# Patient Record
Sex: Female | Born: 1945 | Race: White | Hispanic: No | Marital: Married | Smoking: Former smoker
Health system: Southern US, Community
[De-identification: ages and names within clinical notes are randomized; demographics above are authoritative.]

## PROBLEM LIST (undated history)

## (undated) DIAGNOSIS — K219 Gastro-esophageal reflux disease without esophagitis: Secondary | ICD-10-CM

## (undated) DIAGNOSIS — G4730 Sleep apnea, unspecified: Secondary | ICD-10-CM

## (undated) DIAGNOSIS — E669 Obesity, unspecified: Secondary | ICD-10-CM

## (undated) DIAGNOSIS — R0600 Dyspnea, unspecified: Secondary | ICD-10-CM

## (undated) DIAGNOSIS — I10 Essential (primary) hypertension: Secondary | ICD-10-CM

## (undated) DIAGNOSIS — D329 Benign neoplasm of meninges, unspecified: Secondary | ICD-10-CM

## (undated) DIAGNOSIS — F40240 Claustrophobia: Secondary | ICD-10-CM

## (undated) DIAGNOSIS — J189 Pneumonia, unspecified organism: Secondary | ICD-10-CM

## (undated) DIAGNOSIS — R011 Cardiac murmur, unspecified: Secondary | ICD-10-CM

## (undated) DIAGNOSIS — E785 Hyperlipidemia, unspecified: Secondary | ICD-10-CM

## (undated) DIAGNOSIS — J449 Chronic obstructive pulmonary disease, unspecified: Secondary | ICD-10-CM

## (undated) DIAGNOSIS — I509 Heart failure, unspecified: Secondary | ICD-10-CM

## (undated) DIAGNOSIS — M1990 Unspecified osteoarthritis, unspecified site: Secondary | ICD-10-CM

## (undated) DIAGNOSIS — F329 Major depressive disorder, single episode, unspecified: Secondary | ICD-10-CM

## (undated) DIAGNOSIS — I5181 Takotsubo syndrome: Secondary | ICD-10-CM

## (undated) DIAGNOSIS — F32A Depression, unspecified: Secondary | ICD-10-CM

## (undated) DIAGNOSIS — Z87442 Personal history of urinary calculi: Secondary | ICD-10-CM

## (undated) HISTORY — DX: Hyperlipidemia, unspecified: E78.5

## (undated) HISTORY — PX: ABDOMINAL HYSTERECTOMY: SHX81

## (undated) HISTORY — DX: Heart failure, unspecified: I50.9

## (undated) HISTORY — PX: CARDIAC CATHETERIZATION: SHX172

## (undated) HISTORY — PX: KIDNEY STONE SURGERY: SHX686

## (undated) HISTORY — DX: Benign neoplasm of meninges, unspecified: D32.9

---

## 1998-08-07 ENCOUNTER — Ambulatory Visit (HOSPITAL_COMMUNITY): Admission: RE | Admit: 1998-08-07 | Discharge: 1998-08-07 | Payer: Self-pay | Admitting: Family Medicine

## 1998-12-22 ENCOUNTER — Emergency Department (HOSPITAL_COMMUNITY): Admission: EM | Admit: 1998-12-22 | Discharge: 1998-12-22 | Payer: Self-pay | Admitting: Emergency Medicine

## 1998-12-22 ENCOUNTER — Encounter: Payer: Self-pay | Admitting: Emergency Medicine

## 1998-12-23 ENCOUNTER — Encounter: Payer: Self-pay | Admitting: Emergency Medicine

## 1998-12-30 ENCOUNTER — Encounter: Payer: Self-pay | Admitting: Cardiology

## 1998-12-30 ENCOUNTER — Ambulatory Visit (HOSPITAL_COMMUNITY): Admission: RE | Admit: 1998-12-30 | Discharge: 1998-12-30 | Payer: Self-pay | Admitting: Cardiology

## 1999-09-06 ENCOUNTER — Other Ambulatory Visit: Admission: RE | Admit: 1999-09-06 | Discharge: 1999-09-06 | Payer: Self-pay | Admitting: Family Medicine

## 1999-09-14 ENCOUNTER — Encounter: Payer: Self-pay | Admitting: Family Medicine

## 1999-09-14 ENCOUNTER — Encounter: Admission: RE | Admit: 1999-09-14 | Discharge: 1999-09-14 | Payer: Self-pay | Admitting: Family Medicine

## 2000-09-21 ENCOUNTER — Encounter: Admission: RE | Admit: 2000-09-21 | Discharge: 2000-09-21 | Payer: Self-pay | Admitting: Family Medicine

## 2000-09-21 ENCOUNTER — Encounter: Payer: Self-pay | Admitting: Family Medicine

## 2001-02-05 ENCOUNTER — Other Ambulatory Visit: Admission: RE | Admit: 2001-02-05 | Discharge: 2001-02-05 | Payer: Self-pay | Admitting: Family Medicine

## 2001-07-16 ENCOUNTER — Encounter: Payer: Self-pay | Admitting: Family Medicine

## 2001-07-16 ENCOUNTER — Encounter: Admission: RE | Admit: 2001-07-16 | Discharge: 2001-07-16 | Payer: Self-pay | Admitting: Family Medicine

## 2001-12-13 ENCOUNTER — Other Ambulatory Visit: Admission: RE | Admit: 2001-12-13 | Discharge: 2001-12-13 | Payer: Self-pay | Admitting: Family Medicine

## 2001-12-19 ENCOUNTER — Encounter: Admission: RE | Admit: 2001-12-19 | Discharge: 2001-12-19 | Payer: Self-pay | Admitting: Family Medicine

## 2001-12-19 ENCOUNTER — Encounter: Payer: Self-pay | Admitting: Family Medicine

## 2001-12-25 ENCOUNTER — Ambulatory Visit (HOSPITAL_COMMUNITY): Admission: RE | Admit: 2001-12-25 | Discharge: 2001-12-25 | Payer: Self-pay | Admitting: Cardiology

## 2001-12-25 ENCOUNTER — Encounter: Payer: Self-pay | Admitting: Cardiology

## 2002-06-21 ENCOUNTER — Encounter: Admission: RE | Admit: 2002-06-21 | Discharge: 2002-06-21 | Payer: Self-pay

## 2002-12-09 ENCOUNTER — Encounter: Payer: Self-pay | Admitting: Urology

## 2002-12-16 ENCOUNTER — Observation Stay (HOSPITAL_COMMUNITY): Admission: RE | Admit: 2002-12-16 | Discharge: 2002-12-17 | Payer: Self-pay | Admitting: Urology

## 2003-05-22 ENCOUNTER — Ambulatory Visit (HOSPITAL_BASED_OUTPATIENT_CLINIC_OR_DEPARTMENT_OTHER): Admission: RE | Admit: 2003-05-22 | Discharge: 2003-05-22 | Payer: Self-pay | Admitting: Urology

## 2003-09-16 ENCOUNTER — Encounter: Admission: RE | Admit: 2003-09-16 | Discharge: 2003-09-16 | Payer: Self-pay | Admitting: Family Medicine

## 2003-11-06 ENCOUNTER — Encounter: Admission: RE | Admit: 2003-11-06 | Discharge: 2003-11-06 | Payer: Self-pay

## 2005-09-22 ENCOUNTER — Observation Stay (HOSPITAL_COMMUNITY): Admission: EM | Admit: 2005-09-22 | Discharge: 2005-09-23 | Payer: Self-pay | Admitting: Emergency Medicine

## 2005-09-22 ENCOUNTER — Ambulatory Visit: Payer: Self-pay | Admitting: Family Medicine

## 2006-11-02 ENCOUNTER — Encounter: Admission: RE | Admit: 2006-11-02 | Discharge: 2006-11-02 | Payer: Self-pay | Admitting: Family Medicine

## 2008-04-16 ENCOUNTER — Encounter: Admission: RE | Admit: 2008-04-16 | Discharge: 2008-04-16 | Payer: Self-pay | Admitting: Family Medicine

## 2008-11-24 HISTORY — PX: CHOLECYSTECTOMY: SHX55

## 2008-12-01 ENCOUNTER — Encounter (INDEPENDENT_AMBULATORY_CARE_PROVIDER_SITE_OTHER): Payer: Self-pay | Admitting: *Deleted

## 2008-12-01 ENCOUNTER — Ambulatory Visit (HOSPITAL_COMMUNITY): Admission: RE | Admit: 2008-12-01 | Discharge: 2008-12-02 | Payer: Self-pay | Admitting: *Deleted

## 2010-10-12 ENCOUNTER — Encounter
Admission: RE | Admit: 2010-10-12 | Discharge: 2010-10-12 | Payer: Self-pay | Source: Home / Self Care | Attending: Family Medicine | Admitting: Family Medicine

## 2010-10-28 ENCOUNTER — Encounter
Admission: RE | Admit: 2010-10-28 | Discharge: 2010-10-28 | Payer: Self-pay | Source: Home / Self Care | Attending: Family Medicine | Admitting: Family Medicine

## 2010-11-13 ENCOUNTER — Encounter: Payer: Self-pay | Admitting: Family Medicine

## 2011-02-08 LAB — COMPREHENSIVE METABOLIC PANEL
ALT: 17 U/L (ref 0–35)
AST: 22 U/L (ref 0–37)
Albumin: 3.6 g/dL (ref 3.5–5.2)
Alkaline Phosphatase: 56 U/L (ref 39–117)
BUN: 13 mg/dL (ref 6–23)
CO2: 29 mEq/L (ref 19–32)
Calcium: 9 mg/dL (ref 8.4–10.5)
Chloride: 108 mEq/L (ref 96–112)
Creatinine, Ser: 0.83 mg/dL (ref 0.4–1.2)
GFR calc Af Amer: >60 mL/min (ref >60)
GFR calc non Af Amer: >60 mL/min (ref >60)
Glucose, Bld: 100 mg/dL — ABNORMAL HIGH (ref 70–99)
Potassium: 4 mEq/L (ref 3.5–5.1)
Sodium: 143 mEq/L (ref 135–145)
Total Bilirubin: 1.1 mg/dL (ref 0.3–1.2)
Total Protein: 6.5 g/dL (ref 6.0–8.3)

## 2011-02-08 LAB — DIFFERENTIAL
Basophils Absolute: 0.1 10*3/uL (ref 0.0–0.1)
Basophils Relative: 1 % (ref 0–1)
Eosinophils Absolute: 0.1 10*3/uL (ref 0.0–0.7)
Eosinophils Relative: 2 % (ref 0–5)
Lymphocytes Relative: 32 % (ref 12–46)
Lymphs Abs: 2.5 10*3/uL (ref 0.7–4.0)
Monocytes Absolute: 0.5 10*3/uL (ref 0.1–1.0)
Monocytes Relative: 6 % (ref 3–12)
Neutro Abs: 4.6 10*3/uL (ref 1.7–7.7)
Neutrophils Relative %: 60 % (ref 43–77)

## 2011-02-08 LAB — CBC
HCT: 44.8 % (ref 36.0–46.0)
Hemoglobin: 15.4 g/dL — ABNORMAL HIGH (ref 12.0–15.0)
MCHC: 34.3 g/dL (ref 30.0–36.0)
MCV: 100.3 fL — ABNORMAL HIGH (ref 78.0–100.0)
Platelets: 225 10*3/uL (ref 150–400)
RBC: 4.46 MIL/uL (ref 3.87–5.11)
RDW: 12.4 % (ref 11.5–15.5)
WBC: 7.7 10*3/uL (ref 4.0–10.5)

## 2011-02-14 ENCOUNTER — Other Ambulatory Visit: Payer: Self-pay | Admitting: Orthopedic Surgery

## 2011-02-14 ENCOUNTER — Encounter (HOSPITAL_COMMUNITY): Payer: Medicare Other

## 2011-02-14 ENCOUNTER — Other Ambulatory Visit (HOSPITAL_COMMUNITY): Payer: Self-pay | Admitting: Orthopedic Surgery

## 2011-02-14 ENCOUNTER — Ambulatory Visit (HOSPITAL_COMMUNITY)
Admission: RE | Admit: 2011-02-14 | Discharge: 2011-02-14 | Disposition: A | Discharge status: Home / Self Care | Payer: Medicare Other | Source: Ambulatory Visit | Attending: Orthopedic Surgery | Admitting: Orthopedic Surgery

## 2011-02-14 DIAGNOSIS — Z01812 Encounter for preprocedural laboratory examination: Secondary | ICD-10-CM | POA: Insufficient documentation

## 2011-02-14 DIAGNOSIS — Z01818 Encounter for other preprocedural examination: Secondary | ICD-10-CM

## 2011-02-14 DIAGNOSIS — Z01811 Encounter for preprocedural respiratory examination: Secondary | ICD-10-CM | POA: Insufficient documentation

## 2011-02-14 DIAGNOSIS — I517 Cardiomegaly: Secondary | ICD-10-CM | POA: Insufficient documentation

## 2011-02-14 LAB — BASIC METABOLIC PANEL
BUN: 11 mg/dL (ref 6–23)
CO2: 32 mEq/L (ref 19–32)
Calcium: 9.5 mg/dL (ref 8.4–10.5)
Chloride: 103 mEq/L (ref 96–112)
Creatinine, Ser: 0.91 mg/dL (ref 0.4–1.2)
GFR calc Af Amer: >60 mL/min (ref >60)
GFR calc non Af Amer: >60 mL/min (ref >60)
Glucose, Bld: 74 mg/dL (ref 70–99)
Potassium: 4.2 mEq/L (ref 3.5–5.1)
Sodium: 142 mEq/L (ref 135–145)

## 2011-02-14 LAB — DIFFERENTIAL
Basophils Absolute: 0.1 10*3/uL (ref 0.0–0.1)
Basophils Relative: 1 % (ref 0–1)
Eosinophils Absolute: 0.2 10*3/uL (ref 0.0–0.7)
Eosinophils Relative: 2 % (ref 0–5)
Lymphocytes Relative: 34 % (ref 12–46)
Lymphs Abs: 3.2 10*3/uL (ref 0.7–4.0)
Monocytes Absolute: 0.7 10*3/uL (ref 0.1–1.0)
Monocytes Relative: 7 % (ref 3–12)
Neutro Abs: 5.1 10*3/uL (ref 1.7–7.7)
Neutrophils Relative %: 56 % (ref 43–77)

## 2011-02-14 LAB — CBC
HCT: 47.4 % — ABNORMAL HIGH (ref 36.0–46.0)
Hemoglobin: 16.6 g/dL — ABNORMAL HIGH (ref 12.0–15.0)
MCH: 33.9 pg (ref 26.0–34.0)
MCHC: 35 g/dL (ref 30.0–36.0)
MCV: 96.7 fL (ref 78.0–100.0)
Platelets: 266 10*3/uL (ref 150–400)
RBC: 4.9 MIL/uL (ref 3.87–5.11)
RDW: 12 % (ref 11.5–15.5)
WBC: 9.2 10*3/uL (ref 4.0–10.5)

## 2011-02-14 LAB — PROTIME-INR
INR: 1.02 (ref 0.00–1.49)
Prothrombin Time: 13.6 seconds (ref 11.6–15.2)

## 2011-02-14 LAB — SURGICAL PCR SCREEN
MRSA, PCR: NEGATIVE
Staphylococcus aureus: NEGATIVE

## 2011-02-14 LAB — APTT: aPTT: 30 seconds (ref 24–37)

## 2011-02-15 LAB — URINALYSIS, ROUTINE W REFLEX MICROSCOPIC
Bilirubin Urine: NEGATIVE
Glucose, UA: NEGATIVE mg/dL
Hgb urine dipstick: NEGATIVE
Ketones, ur: NEGATIVE mg/dL
Nitrite: NEGATIVE
Protein, ur: NEGATIVE mg/dL
Specific Gravity, Urine: 1.015 (ref 1.005–1.030)
Urobilinogen, UA: 0.2 mg/dL (ref 0.0–1.0)
pH: 5.5 (ref 5.0–8.0)

## 2011-02-15 LAB — URINE MICROSCOPIC-ADD ON

## 2011-02-22 ENCOUNTER — Inpatient Hospital Stay (HOSPITAL_COMMUNITY)
Admission: RE | Admit: 2011-02-22 | Discharge: 2011-02-25 | DRG: 470 | Disposition: A | Discharge status: Skilled Nursing Facility | Payer: Medicare Other | Source: Ambulatory Visit | Attending: Orthopedic Surgery | Admitting: Orthopedic Surgery

## 2011-02-22 DIAGNOSIS — M1710 Unilateral primary osteoarthritis, unspecified knee: Principal | ICD-10-CM | POA: Diagnosis present

## 2011-02-22 DIAGNOSIS — Z01812 Encounter for preprocedural laboratory examination: Secondary | ICD-10-CM

## 2011-02-22 DIAGNOSIS — N390 Urinary tract infection, site not specified: Secondary | ICD-10-CM | POA: Diagnosis not present

## 2011-02-22 DIAGNOSIS — G4733 Obstructive sleep apnea (adult) (pediatric): Secondary | ICD-10-CM | POA: Diagnosis present

## 2011-02-22 DIAGNOSIS — F341 Dysthymic disorder: Secondary | ICD-10-CM | POA: Diagnosis present

## 2011-02-22 DIAGNOSIS — I10 Essential (primary) hypertension: Secondary | ICD-10-CM | POA: Diagnosis present

## 2011-02-22 HISTORY — PX: TOTAL KNEE ARTHROPLASTY: SHX125

## 2011-02-22 LAB — TYPE AND SCREEN
ABO/RH(D): O POS
Antibody Screen: NEGATIVE

## 2011-02-22 LAB — ABO/RH: ABO/RH(D): O POS

## 2011-02-23 LAB — BASIC METABOLIC PANEL
BUN: 11 mg/dL (ref 6–23)
CO2: 28 mEq/L (ref 19–32)
Calcium: 8.6 mg/dL (ref 8.4–10.5)
Chloride: 105 mEq/L (ref 96–112)
Creatinine, Ser: 0.8 mg/dL (ref 0.4–1.2)
GFR calc Af Amer: >60 mL/min (ref >60)
GFR calc non Af Amer: >60 mL/min (ref >60)
Glucose, Bld: 126 mg/dL — ABNORMAL HIGH (ref 70–99)
Potassium: 4.8 mEq/L (ref 3.5–5.1)
Sodium: 140 mEq/L (ref 135–145)

## 2011-02-23 LAB — CBC
HCT: 41.1 % (ref 36.0–46.0)
Hemoglobin: 13.8 g/dL (ref 12.0–15.0)
MCH: 33.1 pg (ref 26.0–34.0)
MCHC: 33.6 g/dL (ref 30.0–36.0)
MCV: 98.6 fL (ref 78.0–100.0)
Platelets: 221 10*3/uL (ref 150–400)
RBC: 4.17 MIL/uL (ref 3.87–5.11)
RDW: 11.9 % (ref 11.5–15.5)
WBC: 13.4 10*3/uL — ABNORMAL HIGH (ref 4.0–10.5)

## 2011-02-24 LAB — BASIC METABOLIC PANEL
BUN: 12 mg/dL (ref 6–23)
CO2: 28 mEq/L (ref 19–32)
Calcium: 8.5 mg/dL (ref 8.4–10.5)
Chloride: 101 mEq/L (ref 96–112)
Creatinine, Ser: 0.88 mg/dL (ref 0.4–1.2)
GFR calc Af Amer: >60 mL/min (ref >60)
GFR calc non Af Amer: >60 mL/min (ref >60)
Glucose, Bld: 136 mg/dL — ABNORMAL HIGH (ref 70–99)
Potassium: 4.5 mEq/L (ref 3.5–5.1)
Sodium: 137 mEq/L (ref 135–145)

## 2011-02-24 LAB — CBC
HCT: 39.7 % (ref 36.0–46.0)
Hemoglobin: 13.4 g/dL (ref 12.0–15.0)
MCH: 33.9 pg (ref 26.0–34.0)
MCHC: 33.8 g/dL (ref 30.0–36.0)
MCV: 100.5 fL — ABNORMAL HIGH (ref 78.0–100.0)
Platelets: 213 10*3/uL (ref 150–400)
RBC: 3.95 MIL/uL (ref 3.87–5.11)
RDW: 12.1 % (ref 11.5–15.5)
WBC: 13.8 10*3/uL — ABNORMAL HIGH (ref 4.0–10.5)

## 2011-02-25 LAB — URINE MICROSCOPIC-ADD ON

## 2011-02-25 LAB — URINALYSIS, ROUTINE W REFLEX MICROSCOPIC
Bilirubin Urine: NEGATIVE
Glucose, UA: NEGATIVE mg/dL
Ketones, ur: NEGATIVE mg/dL
Leukocytes, UA: NEGATIVE
Nitrite: NEGATIVE
Protein, ur: NEGATIVE mg/dL
Specific Gravity, Urine: 1.022 (ref 1.005–1.030)
Urobilinogen, UA: 0.2 mg/dL (ref 0.0–1.0)
pH: 5.5 (ref 5.0–8.0)

## 2011-02-25 NOTE — Discharge Summary (Signed)
[name], [name]            ACCOUNT NO.:  [id number]  MEDICAL RECORD NO.:  [id number]           PATIENT TYPE:  I  LOCATION:  1604                         FACILITY:  [facility]  PHYSICIAN:  Madlyn Frankel. Charlann Boxer, M.D.  DATE OF BIRTH:  1946-09-11  DATE OF ADMISSION:  02/22/2011 DATE OF DISCHARGE:  02/25/2011                        DISCHARGE SUMMARY - REFERRING   ADMITTING DIAGNOSIS:  Advanced left knee osteoarthritis.  DISCHARGE DIAGNOSES: 1. Advanced left knee osteoarthritis status post left total knee     replacement on Feb 22, 2011. 2. Anxiety. 3. Depression. 4. Hypertension.  ADMITTING HISTORY:  Alexis Edwards is a 65 year old female who presented to the office for evaluation of her left knee.  She had a history of left knee arthroscopy with persistent and progressive discomfort.  She had failed to respond to further conservative measures and was ready to proceed with more definitive measures.  Risks and benefits of kneereplacement and surgery were discussed and reviewed in the office.  She was noted to be a candidate for tranexamic acid.  Postoperatively, she wished to proceed to nursing facility for a rehab.  HOSPITAL COURSE:  The patient was admitted for same-day surgery on Feb 22, 2011.  She underwent an uncomplicated left total knee replacement. Please see dictated operative note for full details of the procedure.  Postoperatively after routine stay in the recovery room, she was transferred to orthopedic ward where she remained for her hospital stay. Her postoperative course was uncomplicated.  Postoperative day #1, she was noted to have a hematocrit of 41.1 on day #2 at 39.7.  Her electrolytes remained stable throughout her hospital stay.  No blood was required.  She had her Foley catheter and Hemovac drains removed on postoperative day #1 and was seen and evaluated by therapy.  Her therapy was a little bit slow based on decreased mobility related to  pain.  Perioperatively, she will be sent to a nursing facility for rehab purposes.  Social Work was consulted for assistance in managing this. By postoperative day #3, she was ready to be transferred to nursing facility.  DISCHARGE INSTRUCTIONS:  The patient will be discharged to [name].  She will be seen and evaluated by Physical Therapy to work on range of motion, strength, and gait training of her left lower extremity.  She has currently an Aquacel dressing in place which can get wet and will remain in place until May 8th or 9th at which point it can be removed and gauze and tape applied.  She will return to see Dr. Durene Romans at [facility], phone number [phone] in 2 weeks' time if appointment has not already been scheduled.  If she has any orthopedic questions, should contact our office.  Please note that postoperative day #3 in the morning she had spiked a fever to 101.6.  Her wound was clean, dry, and intact.  I will order a urinalysis at the time of discharge.  I also placed her on ciprofloxacin to cover on basis while she is in the facility.  This medication can be discontinued if UA at the time of discharge is normal.  DISCHARGE MEDICATIONS: 1. Colace 100 mg p.o. b.i.d. as needed for constipation while on pain     medicine. 2. Aspirin 325 mg p.o. b.i.d. for 30 days. 3. Norco 7.5/325 one to two tablets every 4 to 6 hours as needed for     pain. 4. Robaxin 500 mg p.o. q.6 h. as needed for muscle spasm. 5. MiraLax 17 g p.o. daily as needed for constipation while on pain     medicine. 6. Senokot-S q.h.s. as needed for further constipation while on pain     medicine. 7. Calcium 600 mg b.i.d. 8. Citalopram 40 mg half tablet b.i.d. 9. Glucosamine b.i.d. 10.Hydrochlorothiazide 25 mg q.a.m. 11.Multivitamins p.o. daily. 12.Naproxen 500 mg daily as needed. 13.Vitamin C over-the-counter b.i.d. 14.Vitamin D3 over-the-counter as  needed.  In addition, as noted I did start ciprofloxacin at the time of discharge for complaints consistent with urinary tract infection.     Madlyn Frankel Charlann Boxer, M.D.     MDO/MEDQ  D:  02/25/2011  T:  02/25/2011  Job:  [id number]  Electronically Signed by Durene Romans M.D. on 02/25/2011 02:27:26 PM

## 2011-02-25 NOTE — Op Note (Signed)
Alexis Edwards, Alexis Edwards            ACCOUNT NO.:  [id number]  MEDICAL RECORD NO.:  [id number]           PATIENT TYPE:  I  LOCATION:  0003                         FACILITY:  [facility]  PHYSICIAN:  Madlyn Frankel. Charlann Boxer, M.D.  DATE OF BIRTH:  Jul 12, 1946  DATE OF PROCEDURE:  02/22/2011 DATE OF DISCHARGE:                              OPERATIVE REPORT   PREOPERATIVE DIAGNOSES: 1. Left knee osteoarthritis. 2. Obesity. 3. Right knee osteoarthritis.  POSTOPERATIVE DIAGNOSES: 1. Left knee osteoarthritis. 2. Obesity. 3. Right knee osteoarthritis.  PROCEDURE: 1. Left total-knee replacement. 2. Right knee injection intra-articularly with 80 mg of Depo-Medrol     and 6 cc of Marcaine.  COMPONENTS USED:  DePuy rotating platform posterior stabilized knee system, size 3 femur, 2.5 tibia, 10-mm insert and a 38 patellar button.  SURGEON:  Madlyn Frankel. Charlann Boxer, M.D.  ASSISTANT:  Jaquelyn Bitter. Chabon, P.A.C.  ANESTHESIA:  Spinal.  SPECIMENS:  None.  COMPLICATIONS:  None.  ESTIMATED BLOOD LOSS:  About 100 cc.  DRAINS:  One Hemovac.  TOURNIQUET TIME:  45 minutes at 250 mmHg.  INDICATIONS FOR THE PROCEDURE:  Ms. Strauch is a 65 year old female who had presented to the office for evaluation of bilateral knee pain. Radiographs revealed end-stage degenerative changes bilaterally.  She elected at this point to proceed with a left total-knee replacement. The risks of infection, DVT, and component failure were all discussed and reviewed in the setting of her postoperative course and consent was obtained for the above.  PROCEDURE IN DETAIL:  The patient was brought to the operative theater. Once adequate anesthesia, preoperative antibiotics, Ancef were administered, the patient was positioned supine with a left thigh tourniquet placed.  The left lower extremity was then prepped and draped in the sterile fashion with the left leg in a leg holder.  A time-out was performed identifying the patient, planned  procedure and extremity.  The leg was exsanguinated, tourniquet elevated to 250 mmHg.  Midline incision was made followed by a median arthrotomy following initial exposure and debridement.  Attention was first directed to the patella. Precut measurement was approximately 21 to 22 mm.  I resected down to about 13 to 14 mm and used a 38 patellar button to restore patellar height.  Lug holes were drilled and a metal shim placed.  Attention was now directed to the femur.  The femoral canal was opened and the drill irrigated to try to prevent fat emboli.  An intramedullary rod was passed and at 3 degrees of valgus, 10 mm of bone was resected off the distal femur.  Following this resection, the tibia was subluxated anteriorly and using the extramedullary guide parallel to the shaft of the tibia, I resected a measured cut of 10 mm off the proximal lateral tibia.  Following this resection, we confirmed that the gap was stable medially and laterally with the 10-mm insert as well as confirmed that the cut was perpendicular in the coronal plane.  At this point, I sized the femur to be a size 3.  The size 3 rotation block was pinned into position using the proximal tibia with a C-clamp for rotation, anterior reference.  The 4-in-1 cutting block was then positioned and pinned into place.  Anterior, posterior and chamfer cuts were then made without difficulty nor notching.  The final box cut was made off the lateral aspect of the distal femur.  At this point, the tibia was subluxated anteriorly and the cut surface seemed to be best fit with a size 2.5 tibial tray and it was pinned into position, drilled and keel punched.  Trial reduction was now carried out with the 3 femur, 2.5 tibia, and a 10-mm insert.  With this, the knee came to full extension and the patella tracked through the trochlea without the application of pressure.  Given these findings, the final components were opened.  The knee  was irrigated with normal saline solution pulse lavage and then the synovial capsule junction was then injected with 0.25% Marcaine with epinephrine and 1 cc of Toradol.  At this point, the final components were cemented onto cleaned and dried cut surfaces of bone.  As we were cementing, the femoral component fell off of the impaction device onto the floor.  As we were getting a new femoral component ready, a new batch of cement was mixed.  I did end up putting the trial femur in place and held the knee in extension and removed extruded cement around the tibial component and cemented in the patella.  Once we had the new component in place which was a lugged femur, the lug holes were drilled in the femur and the final component was cemented into place.  The tourniquet was let down after 45 minutes at 250 mmHg with no significant hemostasis required.  The final 10-mm insert was placed and extruded cement was removed throughout the femoral and tibial surfaces.  Once the cement had fully cured, the medium Hemovac drain was placed deep.  I then irrigated the knee out with normal saline solution again. We then reapproximated the extensor mechanism using #1 Vicryl with the knee in flexion.  The remainder of the wound was closed with 2-0 Vicryl and running 4-0 Monocryl.  The knee was cleaned, dried and dressed sterilely using Dermabond and Aquacel dressing.  Following this procedure, a second time-out was performed identifying the procedure for the right knee.  Under Betadine prep, the right knee was injected with 80 mg of Depo-Medrol and 6 cc of Marcaine.  Once this was done, she was then brought to the recovery room in stable condition, tolerating the procedures well.     Madlyn Frankel Charlann Boxer, M.D.     MDO/MEDQ  D:  02/22/2011  T:  02/22/2011  Job:  [id number]  Electronically Signed by Durene Romans M.D. on 02/25/2011 08:15:14 AM

## 2011-03-08 NOTE — Op Note (Signed)
NAME:  Alexis Edwards, Alexis Edwards            ACCOUNT NO.:  [id number]   MEDICAL RECORD NO.:  [id number]          PATIENT TYPE:  AMB   LOCATION:  DAY                          FACILITY:  [facility]   PHYSICIAN:  Alfonse Ras, MD   DATE OF BIRTH:  1946-09-04   DATE OF PROCEDURE:  12/01/2008  DATE OF DISCHARGE:                               OPERATIVE REPORT   PREOPERATIVE DIAGNOSIS:  Symptomatic cholelithiasis.   POSTOPERATIVE DIAGNOSIS:  Symptomatic cholelithiasis.   PROCEDURE:  Laparoscopic cholecystectomy.   ASSISTANT:  Ardeth Sportsman, MD   ANESTHESIA:  General.   DESCRIPTION:  After extensive informed consent was granted in this  morbidly obese patient with known cholelithiasis which was quite  symptomatic.  She was taken back to the operating room and underwent  general anesthesia.  Using a supraumbilical vertical incision, I  dissected down to the fascia which was very very deep.  This was opened  vertically.  An 0-Vicryl pursestring suture was placed around the  fascial defect.  Hassan trocar was placed through the defect.  Pneumoperitoneum was obtained; and under direct vision, an 11 mm trocar  was placed in subxiphoid region and two 5 mm trocars were placed in the  right abdomen.  Gallbladder was identified and retracted cephalad.  Starting at the neck of the gallbladder, a critical view was obtained of  the cystic duct which was very small in diameter, but was well  mobilized, triply clipped and divided.  Cystic artery critical view was  obtained, triply, clipped, and divided.  Gallbladder was taken off the  gallbladder bed using Bovie electrocautery and the gallbladder was  placed in EndoCatch bag.  It was removed through the umbilical port.  There was some moderate bleeding from the skin incision at the  subxiphoid region which was controlled with electrocautery.  The right  upper quadrant was copiously irrigated.  Pneumoperitoneum was released.  Supraumbilical fascial defect was  closed with the 0-Vicryl pursestring  suture.  Skin incisions were closed with subcuticular 4-0 Monocryl.  Steri-Strips and dressings were applied.  The patient tolerated the  procedure well, and went to PACU in good condition.      Alfonse Ras, MD  Electronically Signed     KRE/MEDQ  D:  12/01/2008  T:  12/02/2008  Job:  [id number]

## 2011-03-11 NOTE — Discharge Summary (Signed)
NAMEMarland Kitchen  MALAIJAH, HOUCHEN            ACCOUNT NO.:  [id number]   MEDICAL RECORD NO.:  [id number]          PATIENT TYPE:  INP   LOCATION:  3707                         FACILITY:  [facility]   PHYSICIAN:  Santiago Bumpers. Hensel, M.D.DATE OF BIRTH:  1946-01-11   DATE OF ADMISSION:  09/22/2005  DATE OF DISCHARGE:  09/23/2005                                 DISCHARGE SUMMARY   DISCHARGE DIAGNOSES:  1.  Atypical chest pain.  2.  Obesity.  3.  History of gallbladder dysfunction.  4.  History of arthritis.  5.  History of migraines.   DISCHARGE MEDICATIONS:  1.  Celexa 20 mg daily.  2.  Hydrochlorothiazide 12.5 mg p.o. every day.  3.  Percocet 500/5 mg, one to two tabs p.o. q.4h. p.r.n. for pain.  4.  Prilosec 40 mg p.o. every day.  5.  Continue other supplements like calcium, glucosamine, chondroitin, and      multivitamin as per prior home regimen.   DISCHARGE INSTRUCTIONS:  The patient is instructed to follow up with Dr.  Val Riles at [facility] in 1-2 weeks after discharge.  The  patient agreed to call this clinic to set up an appointment.   HOSPITAL COURSE:  This is a 65 year old female who was admitted, on September 22, 2005, with complaints of shortness of breath with exertion, substernal  chest pain oppressive in character with radiation to the arms and neck  bilaterally at rest.  She also reported nausea and was diaphoretic.  The  patient was treated at her primary care physician's office with sublingual  nitroglycerin and aspirin, and she presented improvement of her pain and was  referred to [facility], and she was admitted  with a possible diagnosis of unstable angina and acute coronary syndrome.   ADMISSION PHYSICAL EXAMINATION:  VITAL SIGNS:  Admission, the patient's  vitals were temperature 98.2, pulse 68, respirations 20, blood pressure  127/60.  She was sating at 97% on 2 liters nasal cannula.  Her physical exam  was basically  normal.  CARDIOVASCULAR:  Showed a regular rate and rhythm with no murmurs, rubs, or  gallops.  No JVD.  No bruits.  Pedal pulses 2/2 bilaterally and not pain  reproducible with chest wall palpation.  LUNGS:  Clear to auscultation bilaterally.  ABDOMEN:  Soft, nontender, nondistended with positive active bowel sounds.  EXTREMITIES:  Showed no edema.  No cyanosis.  No clubbing.   PERTINENT LABORATORY:  The patient had cardiac enzyme markers that were  normal x3.  Her CMP on admission showed a sodium of 143.  Her potassium was  3.3, chloride 106, CO2 27, glucose 48, and calcium 9.2.  Her BUN was 12,  creatinine 0.8.  Total bilirubin 0.8.  Liver function tests were all within  normal limits.  The patient's TSH was 2.908.  B natriuretic peptide was less  than 30.  Serum amylase was 60 and vitamin B-12 was 753.  The patient had a  fasting lipid profile that showed a total cholesterol of 181 with  triglycerides 120, HDL 39, and LDL 118.  On discharge day, the  patient's B-  MET was within normal limits with a sodium of 139, potassium 3.7, chloride  107, CO2 25, glucose 131, BUN 11, creatinine 0.9, calcium 8.5.  The  patient's admission EKG and repeat EKG 24 hours after admission showed sinus  rhythm with no ischemic changes.  Chest x-ray, on September 22, 2005, showed  cardiomegaly with mild interstitial edema.  Abdominal ultrasound, on  September 23, 2005, showed probably tiny gallbladder polyp, no stones, no  biliary tract dilatation, a 1.4-cm left intrahepatic lobe complex cyst  versus solid mass with the recommendation of MRI or CT for further  evaluation.   PROCEDURES:  The patient underwent cardiac catheterization, on September 23, 2005, that showed normal coronary arteries, normal left ventricle, normal  CVEDP, with an ejection fraction of 55%.   CONSULTATIONS:  Georga Hacking, M.D., cardiologist was consulted.   PROBLEM LIST:  1.  Chest pain.  The pain was initially treated with  morphine, later      transitioned to Percocet.  The patient's chest pain during the      hospitalization did not escalate and it was almost resolved on discharge      day.  The patient still had some mild discomfort but it was improving.      She underwent cardiac catheterization that showed normal coronary      arteries.  She has cardiac enzymes negative per three times.  She did      not have ischemic changes in her EKG.  Possible etiology will be      gallbladder dysfunction or acid reflux.  The patient's abdominal      ultrasound was negative for gallbladder stones or biliary tract      dilatation.  I will discharge the patient home to continue GI workup as      an outpatient.  The patient will be discharged on Prilosec 40 mg daily      and she also will be given 20 p.o. of Percocet to take q.4-6h. p.r.n. if      recurrence of her pain.  The patient's nausea and diaphoresis were      completely resolved.  And, the patient has been treated with PPI during      her hospital stay.  2.  Hypertension, remained stable during admission.  On discharge, the      patient's blood pressure was 147/75, likely related to stress and recent      cardiac catheterization.  I will discharge her on her home regimen of      hydrochlorothiazide 12.5 mg daily.  Further additions or titration of      her antihypertensive regimen should be done as per primary care      provider.  3.  Other chronic conditions remained stable during admission.  The patient      was advised to continue her home regimen as instructed by her primary      care doctor.   ISSUES TO FOLLOW:  1.  The patient likely will need to continue a GI workup to determine the      origin of her symptoms.  2.  The patient had a not optimal HDL and LDL ratio.  Up to primary care      provider to start her on medications to optimize these values.  3.  Left intrahepatic lobe complex cyst versus solid mass needs further     evaluation as  recommended per radiology.   DISPOSITION:  The patient was discharged  home to follow up with Dr.  Val Riles at [facility] in [place] in improved stable  condition.  Recommend continue GI workup and the patient will call primary  care provider's office to set up a followup appointment in 1-2 weeks.  Apparently, she believes she has an appointment on October 14, 2005 already  set up.      Sharin Grave, MD    ______________________________  Santiago Bumpers [name], M.D.    AM/MEDQ  D:  09/23/2005  T:  09/24/2005  Job:  [id number]   cc:   Hammerick, Dr.  [facility]  [place], [place]

## 2011-03-11 NOTE — Cardiovascular Report (Signed)
NAME:  Alexis Edwards, Alexis Edwards            ACCOUNT NO.:  [id number]   MEDICAL RECORD NO.:  [id number]          PATIENT TYPE:  INP   LOCATION:  3707                         FACILITY:  [facility]   PHYSICIAN:  Georga Hacking, M.D.DATE OF BIRTH:  Sep 24, 1946   DATE OF PROCEDURE:  09/23/2005  DATE OF DISCHARGE:  09/23/2005                              CARDIAC CATHETERIZATION   HISTORY:  A 65 year old female, with obesity and family history of heart  disease, who presented with prolonged chest pain consistent with unstable  angina. Had negative EKG's and enzymes.   COMMENTS ABOUT PROCEDURE:  The patient tolerated the procedure well without  complications. The right femoral artery was entered using a single anterior  needle wall stick. Procedure was done with 6-French catheters. A right  femoral artery angiogram was done at the end of the procedure but there was  a side branch that came off of the insertion site and it was opted not to  close the artery and manual pressure was held. The patient tolerated the  procedure well.   HEMODYNAMIC DATA:  Aorta post contrast 131/63; LV postcontrast 131/20.   ANGIOGRAPHIC DATA:  Left ventriculogram: Performed in the 30 degree RAO  projection. The aortic valve is normal. The mitral valve appears normal. The  estimated ejection fraction is 60-65%. The coronary arteries arise and  distribute normally. There is no significant coronary calcification present.  The left main coronary artery is normal. The left anterior descending  extends to the apex and has a large diagonal branch. There is no significant  obstructive stenoses noted. The circumflex coronary artery has several  marginal branches and contains mild irregularities but no significant  disease is noted. The right coronary artery is a large dominant vessel  supplying a posterior descending that reaches the apex as well as several  posterolateral branches. It also appears free of disease with minor  irregularities.   IMPRESSION:  1.  No significant large vessel obstructive coronary artery disease noted;      scattered luminal irregularities are present.  2.  Normal left ventricular function with increased left ventricular end-      diastolic pressure.   RECOMMENDATIONS:  Evaluate for other causes of chest discomfort with a  negative EKG and enzymes. It is unlikely this is ischemic pain.      Georga Hacking, M.D.  Electronically Signed     WST/MEDQ  D:  09/23/2005  T:  09/24/2005  Job:  [id number]   cc:   William A. [name], M.D.  Fax: [phone]   Jennette Kettle, M.D.  [place], [place]

## 2011-03-11 NOTE — Consult Note (Signed)
Alexis Edwards, Alexis Edwards            ACCOUNT NO.:  [id number]   MEDICAL RECORD NO.:  [id number]          PATIENT TYPE:  INP   LOCATION:  3707                         FACILITY:  [facility]   PHYSICIAN:  Georga Hacking, M.D.DATE OF BIRTH:  02-22-1946   DATE OF CONSULTATION:  09/22/2005  DATE OF DISCHARGE:                                   CONSULTATION   REASON FOR CONSULTATION:  Chest pain.   HISTORY:  The patient is a 65 year old female who has a prior history of  severe obesity and a remote history of cigarette abuse and family history.  She has had dyspnea on exertion chronically, but two weeks ago became worse.  While driving to work this morning around 8:00 she had the onset of  epigastric discomfort such as indigestion. After getting to work she  developed more severe intensity of it that radiated up into her neck and  bilaterally into her arms. She had some diaphoresis and nausea with it, and  it was severe. She went and saw her primary physician in [place], she was  given one nitroglycerin, EMS was called and two additional nitroglycerins  were performed. An EKG showed poor R-wave progression but no acute changes.  The pain gradually subsided after four or five hours. She had negative point  of care enzymes and repeat EKG here showed significant baseline artifact.  She is currently pain free.   Her past history is remarkable for severe obesity since she quit smoking 20  years ago. She has had recent osteoarthritis and has a prior history of  migraine headaches. There is no history of hypertension or diabetes. She  thinks her cholesterol may have been somewhat elevated a few years ago.  Previous surgery: Knee arthroscopy, hysterectomy, bladder repair followed by  erosion of the pelvic sling several years ago.   ALLERGIES:  MACROBID and SULFA.   CURRENT MEDICATIONS:  1.  Celexa 10 mg daily.  2.  Hydrochlorothiazide 12.5 mg daily.   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 She quit smoking 20 years ago, but had a 35-pack year  history. She drinks alcohol socially. She lives with her husband, a son,  wife, and two grandchildren live with her also.   FAMILY HISTORY:  Father died of a gunshot wound.  Mother died of a staph  infection. She has three brothers and three sisters. One sister had bypass  grafting and one brother had a stent in his 17s.   REVIEW OF SYSTEMS:  Morbidly obese for years. She complains of some blurred  vision. She has a history of migraine headaches and has taken Imitrex for  these in the past, but none recently. She has no difficulty. Several years  ago reportedly had ulcers. She has urinary incontinence. No recent urinary  infections. Has significant osteoarthritis of the knees. No history of  hematochezia. She has no history of pulmonary embolus, asthma, cough,  wheezing, or hemoptysis. She has no history of stroke or TIA. No significant  allergies.   PHYSICAL EXAMINATION:  VITAL SIGNS: She is morbidly obese female, 5  feet 2  inches, weighing 260 pounds. Blood pressure is 125/60.  SKIN: Warm and dry.  HEENT: EOMI. PERLA. Pharynx negative.  CNS: Clear. Fundi not examined.  NECK: Supple without masses. No JVD, thyromegaly, or bruits.  LUNGS: Clear to A&P.  CARDIOVASCULAR: Normal S1 and S2. No S3, S4, or murmurs. There is no chest  wall tenderness.  ABDOMEN: Quite large and soft. No gross masses are noted. Femoral pulses are  very deep. Distal pulses are 2+. There is no edema.   Initial cardiac enzymes are negative. Her labs are negative except for a  potassium of 3.3. There is no chest x-ray available for review.   IMPRESSION:  1.  Chest discomfort with features suggestive of unstable angina or acute      coronary syndrome. However, no EKG abnormalities or abnormal cardiac      enzymes despite a very prolonged episode. Differential possibility would      include coronary artery disease,  esophageal spasm, or gallbladder      disease.  2.  Severe morbid obesity.  3.  Recent edema.  4.  History of migraine headaches.   RECOMMENDATIONS:  The patient should be placed on low-dose Lovenox, given  aspirin as well as beta blockers. She should have serial cardiac enzymes  evaluated. I think the best way to approach this would to proceed with  cardiac catheterization. The cardiac catheterization procedure was discussed  with the patient and her husband including risks of myocardial infarction,  stroke, death, bleeding, arrhythmia, dye allergy, or renal insufficiency.  They understand and are willing to proceed. The possibility of angioplasty  or stenting were discussed with the patient also including risks of re-  stenosis and mismanagement as well as emergency bypass grafting.      Georga Hacking, M.D.  Electronically Signed     WST/MEDQ  D:  09/22/2005  T:  09/22/2005  Job:  [id number]   cc:   Dr. Nathanial Rancher

## 2011-03-11 NOTE — H&P (Signed)
NAMEMarland Edwards  Alexis, Edwards            ACCOUNT NO.:  [id number]   MEDICAL RECORD NO.:  [id number]          PATIENT TYPE:  INP   LOCATION:  3707                         FACILITY:  [facility]   PHYSICIAN:  Santiago Bumpers. Hensel, M.D.DATE OF BIRTH:  April 19, 1946   DATE OF ADMISSION:  09/22/2005  DATE OF DISCHARGE:                                HISTORY & PHYSICAL   CHIEF COMPLAINT:  Chest pain.   HISTORY OF PRESENT ILLNESS:  [name] is a 65 year old female who was  in her usual state of health until 2+ weeks ago when she became to have  shortness of breath with exertion.  She says she can walk about three-  fourths the way up the stairs before she starts to get short of breath.  At  9 a.m. this morning she started having substernal chest pain that felt like  an elephant standing on her chest that radiated to bilateral arms and neck  at rest.  Patient reported diaphoresis and nausea with the pain.  She says  pain was 8/10 in intensity.  She received nitroglycerin sublingual and  aspirin at her primary care physician's and the pain eased with the  nitroglycerin.  Patient is currently 3/10 and does not change with position.   REVIEW OF SYSTEMS:  Positive for headache after the nitroglycerin was  started.  No calf pain.  No constipation or diarrhea.  Stools have been more  yellow, though, for a few days.  Last week she did have an increase in  blurry vision.  No increase in frequency of her urine and no change in  thirst.   PAST MEDICAL HISTORY:  1.  Obesity.  2.  Gallbladder attack.  3.  Status post hysterectomy.  4.  Leg swelling.  5.  Question of arthritis.  6.  Status post knee arthroscopy.  7.  Migraines.   MEDICATIONS:  1.  Celexa 10 mg daily.  2.  Hydrochlorothiazide 12.5 mg daily.  3.  Glucosamine chondroitin.  4.  Calcium.  5.  Multivitamin.   Patient is allergic to [facility] and SULFA.   SOCIAL HISTORY:  Patient quit smoking 20 years ago but she had a smoking  history of  about 35 years.  Rare alcohol use.  No drugs.  Patient lives with  husband, son, wife, and two grandchildren.  Patient is a Designer, [employer].   FAMILY HISTORY:  Brother with a stent at 13 years old.  Mother who died of  infection, father who died with gunshot wound.  Grandma, mother, and sister  with breast cancer.  Mom with type 2 diabetes.   PHYSICAL EXAMINATION:  VITAL SIGNS:  98.2, pulse is 68, respirations 20,  blood pressure 127/60, 97% on 2 L nasal cannula.  HEENT:  Extraocular movements intact.  Pupils are equal, round, and reactive  to light.  Oropharynx clear.  No lymphadenopathy.  CARDIOVASCULAR:  Regular rate and rhythm.  No murmurs.  No JVD.  No bruits.  2/2 bilateral pedal pulses.  Pain not reproducible with palpation.  LUNGS:  Clear to auscultation bilaterally.  ABDOMEN:  Soft, nontender, nondistended.  Positive bowel sounds.  Obese.  EXTREMITIES:  No clubbing, cyanosis, edema.   LABORATORIES:  Point of care markers were negative x2 and EKG shows sinus  rhythm, no axial deviation.  No ST changes or T-wave inversions.  Chest x-  ray is pending.   ASSESSMENT/PLAN:  This is a 65 year old female with typical chest pain.  1.  Chest pain.  Pain is substernal and relieved with nitroglycerin and      onset usually within exertion, but today is at rest.  Pain is likely      unstable angina.  Will check cardiac enzymes, TSH, PT/PTT/INR.  Will      need outpatient stress test.  Patient has O2 on.  Morphine and      nitroglycerin p.r.n.  Aspirin daily.  Will start beta blockers, but with      history of low blood pressures, will need to watch this carefully.  No      calf pain, no risk for deep venous thrombosis or pulmonary embolism,      therefore low suspicion.  No tearing pain, therefore low suspicion for      aortic dissection.  No change with movement, therefore low suspicion for      pericarditis.  Pain is not reproducible with palpation.  2.  __________.  We will continue  hydrochlorothiazide.  3.  Deep venous thrombosis prophylaxis.  We will put the patient on Lovenox.      Rolm Gala, M.D.    ______________________________  Santiago Bumpers. [name], M.D.    Bennetta Laos  D:  09/22/2005  T:  09/22/2005  Job:  [id number]

## 2011-03-11 NOTE — Op Note (Signed)
   NAME:  Alexis Edwards, Alexis Edwards                      ACCOUNT NO.:  [id number]   MEDICAL RECORD NO.:  [id number]                   PATIENT TYPE:  AMB   LOCATION:  NESC                                 [facility]ACILITY:  [facility]   PHYSICIAN:  Sigmund I. Patsi Sears, M.D.         DATE OF BIRTH:  [birth year]   DATE OF PROCEDURE:  05/22/2003  DATE OF DISCHARGE:                                 OPERATIVE REPORT   PREOPERATIVE DIAGNOSIS:  Extruded transobturator anti-incontinence sling.   POSTOPERATIVE DIAGNOSIS:  Extruded transobturator anti-incontinence sling.   OPERATION:  Revision of extrusion of transobturator vaginal sling and  reinforcement with a 2 x 7 portion of Tutoplast fascia.   SURGEON:  Sigmund I. Patsi Sears, M.D.   ASSISTANT:  Terri Piedra, N.P.-C.   PREPARATION:  After appropriate preanesthesia, the patient is brought to the  operating room and placed on the operating table in the dorsal supine  position where general LMA anesthesia was introduced.  She was then re-  placed in the dorsal lithotomy position where the pubis was prepped with  Betadine solution and draped in the usual fashion.   REVIEW OF HISTORY:  Ms. Stenberg is status post transobturator pubovaginal  sling tape on December 16, 2002, with transvaginal erosion of the sling  material for repair today.   DESCRIPTION OF PROCEDURE:  Elliptical incision is made of the extruded tape  and tissue, and subcutaneous tissue is dissected.  Copious amounts of  antibiotic irrigation are used to dissect the tissue.  A 2 x 7 portion of  Tutoplast fascia is used to cover the buried vaginal epithelial tissue, and  using running and interrupted 3-0 PDS suture, the vaginal wound is re-  closed.  The patient tolerated the procedure well, was given IV Toradol and  a B&O suppository, awakened, and taken to the recovery room in good  condition.                                               Sigmund I. Patsi Sears, M.D.    SIT/MEDQ  D:   05/22/2003  T:  05/22/2003  Job:  [id number]

## 2011-03-11 NOTE — Op Note (Signed)
NAME:  Alexis Edwards, Alexis Edwards                      ACCOUNT NO.:  [id number]   MEDICAL RECORD NO.:  [id number]                   PATIENT TYPE:  AMB   LOCATION:  DAY                                  FACILITY:  [facility]   PHYSICIAN:  Sigmund I. Patsi Sears, M.D.         DATE OF BIRTH:  [birth year]   DATE OF PROCEDURE:  12/16/2002  DATE OF DISCHARGE:                                 OPERATIVE REPORT   PREOPERATIVE DIAGNOSIS:  Stress urinary incontinence.   POSTOPERATIVE DIAGNOSIS:  Stress urinary incontinence.   PROCEDURES:  1. Cystoscopy.  2. Trans-obturator tape urethral sling.   SURGEON:  Sigmund I. Patsi Sears, M.D.   ASSISTANTS:  Crecencio Mc, M.D., and [facility]   ANESTHESIA:  General.   COMPLICATIONS:  None.   INDICATIONS:  The patient is a 65 year old white female with a history of  stress urinary incontinence.  After undergoing evaluation, options were  discussed for treatment of her stress urinary incontinence and she elected  to proceed with a urethral sling.  After discussion of various sling  procedures, the patient elected to proceed with a trans-obturator tape  urethral sling.  Potential risks and benefits of this procedure were  explained to the patient, and she consented.   DESCRIPTION OF PROCEDURE:  The patient was taken to the operating room, and  a general anesthetic was administered.  The patient was given preoperative  antibiotics, placed in the dorsal lithotomy position, and prepped and draped  in the usual sterile fashion.  Next a 16 French Foley catheter was inserted  into the bladder and the bladder was drained.  A weighted speculum was then  placed in the posterior vagina and lidocaine with epinephrine was injected  into the anterior vaginal mucosa.  A midline incision was then made over the  mid-urethral portion of the vaginal mucosa with a 15 blade.  Sharp  dissection then proceeded laterally on either side of the urethra until a  finger could be  passed up onto the inferior ramus of the pubic bone.  A stab  wound was then made over the labial fat pad approximately 3 cm lateral to  and at the level of the clitoris.  The trans-obturator trocar was then  placed and brought just posterior to the inferior ramus of the pubic bone  and brought out the vaginal incision under digital direction.  The  polypropylene mesh sling was then passed from this trocar, and it was  brought out the incision in the skin.  This was performed in a similar  fashion on the opposite side.  The sling was then adjusted so that it was in  the proper position with the midline overlying the mid-urethra.  Care was  taken not to pull the sling too tight.  Cystoscopy was then performed and  demonstrated no injury of the bladder.  The Foley catheter was then  reinserted and the bladder was emptied.  It was decided to place a  piece of  Tutoplast fascia over the sling.  The incision was then closed in a  transverse fashion with a running 3-0 Vicryl suture.  Copious antibiotic  irrigation solution was used throughout the procedure.  A vaginal packing  with Estrace cream was then placed and the procedure was ended after the  stab incisions in the skin were closed with Dermabond.  The patient appeared  to tolerate the procedure well and  without complications.  She was able to be awakened and transferred to the  recovery unit in satisfactory condition.  Please note that Sigmund I.  Patsi Sears, M.D., was the operating surgeon and was present and participated  in the entire procedure.     Crecencio Mc, M.D.                          Sigmund I. Patsi Sears, M.D.    LB/MEDQ  D:  12/16/2002  T:  12/16/2002  Job:  [id number]

## 2011-04-04 NOTE — H&P (Signed)
NAME:  Alexis Edwards, Alexis Edwards            ACCOUNT NO.:  [id number]  MEDICAL RECORD NO.:  LOCATION:                                 FACILITY:  PHYSICIAN:  Madlyn Frankel. Charlann Boxer, M.D.  DATE OF BIRTH:  1946-09-22  DATE OF ADMISSION:  02/22/2011 DATE OF DISCHARGE:                             HISTORY & PHYSICAL   DATE OF SURGERY:  Feb 22, 2011.  ADMISSION DIAGNOSIS:  End-stage osteoarthritis of the left knee.  HISTORY OF PRESENT ILLNESS:  This is a 65 year old lady with a history of osteoarthritis in her left knee with previous arthroscopic debridement who is now hurting in both knees, left greater the right. She is now scheduled for total knee arthroplasty of the left knee.  The surgeries, benefits, and aftercare were discussed in detail with the patient.  Questions invited and answered.  Note that the patient is a candidate for tranexamic acid.  She will receive that from preoperative holding area and also note that the patient does have sleep apnea but does not use a CPAP machine at home.  She also will be going to rehab postop, so she was not given her postop medications today.  PAST MEDICAL HISTORY:  Drug allergies to SULFA and MACROBID.  CURRENT MEDICATIONS:  Celexa and hydrochlorothiazide.  She does not have current dosages with her.  Previous surgeries include hysterectomy, cholecystectomy, and knee arthroscopy and serious medical illnesses include hypertension, depression, and sleep apnea, and obesity.  SOCIAL HISTORY:  The patient is married.  She is retired.  She does not smoke and does not drink.  FAMILY HISTORY:  Positive for cancer and suicide.  REVIEW OF SYSTEMS:  CENTRAL NERVOUS SYSTEM:  Positive for occasional tension headache, negative for blurred vision, or dizziness.  PULMONARY: Positive for exertional shortness of breath and sleep apnea negative for PND and orthopnea.  CARDIOVASCULAR:  Negative for chest pain or palpitation.  GI:  Negative for ulcers or hepatitis.   GU:  Positive for history of kidney stones and urinary tract infection.  MUSCULOSKELETAL: Positives in HPI.  PHYSICAL EXAMINATION:  VITAL SIGNS:  BP 110/78, respirations 18, pulse 80 and regular. GENERAL APPEARANCE:  This is an obese lady in no acute distress. HEENT:  Head normocephalic.  Nose patent.  Ears patent.  Pupils are equal, round, react to light.  Throat without injection. NECK:  Supple without adenopathy.  Carotids are 2+ without bruit. CHEST:  Clear to auscultation.  No rales or rhonchi.  Respirations 18. HEART:  Regular rate and rhythm at 80 beats per minute without murmur. ABDOMEN:  Soft with active bowel sounds.  No masses or organomegaly. NEUROLOGIC:  The patient is alert and oriented to time, place, and person.  Cranial nerves II through XII grossly intact. EXTREMITIES:  Left knee with a 3 degrees flexion contracture.  Further flexion to 125 degrees, varus deformity.  Neurovascular status is intact.  IMPRESSION:  Left knee osteoarthritis.  PLAN:  Left total knee arthroplasty.Jaquelyn Bitter. Chabon, P.A.   ______________________________ Madlyn Frankel Charlann Boxer, M.D.    SJC/MEDQ  D:  02/02/2011  T:  02/03/2011  Job:  [id number]  Electronically Signed by Jodene Nam P.A. on 03/29/2011 07:24:36 AM Electronically Signed by Lupita Shutter.D.  on 04/04/2011 09:18:21 AM

## 2011-11-25 DIAGNOSIS — H9190 Unspecified hearing loss, unspecified ear: Secondary | ICD-10-CM | POA: Diagnosis not present

## 2011-11-25 DIAGNOSIS — M549 Dorsalgia, unspecified: Secondary | ICD-10-CM | POA: Diagnosis not present

## 2011-12-22 DIAGNOSIS — N318 Other neuromuscular dysfunction of bladder: Secondary | ICD-10-CM | POA: Diagnosis not present

## 2011-12-22 DIAGNOSIS — G4733 Obstructive sleep apnea (adult) (pediatric): Secondary | ICD-10-CM | POA: Diagnosis not present

## 2011-12-22 DIAGNOSIS — Z79899 Other long term (current) drug therapy: Secondary | ICD-10-CM | POA: Diagnosis not present

## 2011-12-22 DIAGNOSIS — F329 Major depressive disorder, single episode, unspecified: Secondary | ICD-10-CM | POA: Diagnosis not present

## 2011-12-22 DIAGNOSIS — I10 Essential (primary) hypertension: Secondary | ICD-10-CM | POA: Diagnosis not present

## 2011-12-22 DIAGNOSIS — F3289 Other specified depressive episodes: Secondary | ICD-10-CM | POA: Diagnosis not present

## 2011-12-22 DIAGNOSIS — N390 Urinary tract infection, site not specified: Secondary | ICD-10-CM | POA: Diagnosis not present

## 2012-01-23 DIAGNOSIS — I5181 Takotsubo syndrome: Secondary | ICD-10-CM

## 2012-01-23 HISTORY — DX: Takotsubo syndrome: I51.81

## 2012-02-02 DIAGNOSIS — I509 Heart failure, unspecified: Secondary | ICD-10-CM | POA: Diagnosis present

## 2012-02-02 DIAGNOSIS — I200 Unstable angina: Secondary | ICD-10-CM | POA: Diagnosis not present

## 2012-02-02 DIAGNOSIS — I5181 Takotsubo syndrome: Secondary | ICD-10-CM | POA: Diagnosis present

## 2012-02-02 DIAGNOSIS — I10 Essential (primary) hypertension: Secondary | ICD-10-CM | POA: Diagnosis present

## 2012-02-02 DIAGNOSIS — F3289 Other specified depressive episodes: Secondary | ICD-10-CM | POA: Diagnosis present

## 2012-02-02 DIAGNOSIS — G4733 Obstructive sleep apnea (adult) (pediatric): Secondary | ICD-10-CM | POA: Diagnosis present

## 2012-02-02 DIAGNOSIS — F329 Major depressive disorder, single episode, unspecified: Secondary | ICD-10-CM | POA: Diagnosis present

## 2012-02-02 DIAGNOSIS — I5021 Acute systolic (congestive) heart failure: Secondary | ICD-10-CM | POA: Diagnosis present

## 2012-02-02 DIAGNOSIS — J449 Chronic obstructive pulmonary disease, unspecified: Secondary | ICD-10-CM | POA: Diagnosis present

## 2012-02-02 DIAGNOSIS — R9431 Abnormal electrocardiogram [ECG] [EKG]: Secondary | ICD-10-CM | POA: Diagnosis not present

## 2012-02-02 DIAGNOSIS — R079 Chest pain, unspecified: Secondary | ICD-10-CM | POA: Diagnosis not present

## 2012-02-02 DIAGNOSIS — R0902 Hypoxemia: Secondary | ICD-10-CM | POA: Diagnosis present

## 2012-02-08 DIAGNOSIS — I509 Heart failure, unspecified: Secondary | ICD-10-CM | POA: Diagnosis not present

## 2012-02-08 DIAGNOSIS — D72829 Elevated white blood cell count, unspecified: Secondary | ICD-10-CM | POA: Diagnosis not present

## 2012-02-08 DIAGNOSIS — R0902 Hypoxemia: Secondary | ICD-10-CM | POA: Diagnosis not present

## 2012-02-08 DIAGNOSIS — E876 Hypokalemia: Secondary | ICD-10-CM | POA: Diagnosis not present

## 2012-02-08 DIAGNOSIS — I5181 Takotsubo syndrome: Secondary | ICD-10-CM | POA: Diagnosis not present

## 2012-02-09 DIAGNOSIS — G4733 Obstructive sleep apnea (adult) (pediatric): Secondary | ICD-10-CM | POA: Diagnosis not present

## 2012-02-09 DIAGNOSIS — I509 Heart failure, unspecified: Secondary | ICD-10-CM | POA: Diagnosis not present

## 2012-02-09 DIAGNOSIS — R0602 Shortness of breath: Secondary | ICD-10-CM | POA: Diagnosis not present

## 2012-02-09 DIAGNOSIS — I2510 Atherosclerotic heart disease of native coronary artery without angina pectoris: Secondary | ICD-10-CM | POA: Diagnosis not present

## 2012-02-09 DIAGNOSIS — R42 Dizziness and giddiness: Secondary | ICD-10-CM | POA: Diagnosis not present

## 2012-02-10 DIAGNOSIS — R0602 Shortness of breath: Secondary | ICD-10-CM | POA: Diagnosis not present

## 2012-02-10 DIAGNOSIS — Z79899 Other long term (current) drug therapy: Secondary | ICD-10-CM | POA: Diagnosis not present

## 2012-02-15 DIAGNOSIS — I2510 Atherosclerotic heart disease of native coronary artery without angina pectoris: Secondary | ICD-10-CM | POA: Diagnosis not present

## 2012-02-15 DIAGNOSIS — I509 Heart failure, unspecified: Secondary | ICD-10-CM | POA: Diagnosis not present

## 2012-02-20 DIAGNOSIS — R0609 Other forms of dyspnea: Secondary | ICD-10-CM | POA: Diagnosis not present

## 2012-02-20 DIAGNOSIS — G4733 Obstructive sleep apnea (adult) (pediatric): Secondary | ICD-10-CM | POA: Diagnosis not present

## 2012-02-20 DIAGNOSIS — G4730 Sleep apnea, unspecified: Secondary | ICD-10-CM | POA: Diagnosis not present

## 2012-02-20 DIAGNOSIS — G4710 Hypersomnia, unspecified: Secondary | ICD-10-CM | POA: Diagnosis not present

## 2012-02-20 DIAGNOSIS — R0989 Other specified symptoms and signs involving the circulatory and respiratory systems: Secondary | ICD-10-CM | POA: Diagnosis not present

## 2012-02-24 ENCOUNTER — Encounter (HOSPITAL_COMMUNITY): Payer: Medicare Other

## 2012-03-05 ENCOUNTER — Other Ambulatory Visit: Payer: Self-pay | Admitting: Family Medicine

## 2012-03-05 DIAGNOSIS — Z23 Encounter for immunization: Secondary | ICD-10-CM | POA: Diagnosis not present

## 2012-03-05 DIAGNOSIS — R7309 Other abnormal glucose: Secondary | ICD-10-CM | POA: Diagnosis not present

## 2012-03-05 DIAGNOSIS — Z0000 Encounter for general adult medical examination without abnormal findings: Secondary | ICD-10-CM | POA: Diagnosis not present

## 2012-03-05 DIAGNOSIS — Z79899 Other long term (current) drug therapy: Secondary | ICD-10-CM | POA: Diagnosis not present

## 2012-03-05 DIAGNOSIS — E7800 Pure hypercholesterolemia, unspecified: Secondary | ICD-10-CM | POA: Diagnosis not present

## 2012-03-05 DIAGNOSIS — Z1231 Encounter for screening mammogram for malignant neoplasm of breast: Secondary | ICD-10-CM

## 2012-03-13 ENCOUNTER — Ambulatory Visit
Admission: RE | Admit: 2012-03-13 | Discharge: 2012-03-13 | Disposition: A | Discharge status: Home / Self Care | Payer: Medicare Other | Source: Ambulatory Visit | Attending: Family Medicine | Admitting: Family Medicine

## 2012-03-13 DIAGNOSIS — Z1231 Encounter for screening mammogram for malignant neoplasm of breast: Secondary | ICD-10-CM | POA: Diagnosis not present

## 2012-03-22 DIAGNOSIS — I509 Heart failure, unspecified: Secondary | ICD-10-CM | POA: Diagnosis not present

## 2012-03-22 DIAGNOSIS — E782 Mixed hyperlipidemia: Secondary | ICD-10-CM | POA: Diagnosis not present

## 2012-03-28 DIAGNOSIS — E782 Mixed hyperlipidemia: Secondary | ICD-10-CM | POA: Diagnosis not present

## 2012-03-28 DIAGNOSIS — R5381 Other malaise: Secondary | ICD-10-CM | POA: Diagnosis not present

## 2012-03-28 DIAGNOSIS — R0602 Shortness of breath: Secondary | ICD-10-CM | POA: Diagnosis not present

## 2012-03-28 DIAGNOSIS — Z79899 Other long term (current) drug therapy: Secondary | ICD-10-CM | POA: Diagnosis not present

## 2012-03-28 DIAGNOSIS — R5383 Other fatigue: Secondary | ICD-10-CM | POA: Diagnosis not present

## 2012-03-31 DIAGNOSIS — R079 Chest pain, unspecified: Secondary | ICD-10-CM | POA: Diagnosis not present

## 2012-03-31 DIAGNOSIS — M542 Cervicalgia: Secondary | ICD-10-CM | POA: Diagnosis not present

## 2012-03-31 DIAGNOSIS — N390 Urinary tract infection, site not specified: Secondary | ICD-10-CM | POA: Diagnosis not present

## 2012-03-31 DIAGNOSIS — R1013 Epigastric pain: Secondary | ICD-10-CM | POA: Diagnosis not present

## 2012-03-31 DIAGNOSIS — R0602 Shortness of breath: Secondary | ICD-10-CM | POA: Diagnosis not present

## 2012-05-10 DIAGNOSIS — M546 Pain in thoracic spine: Secondary | ICD-10-CM | POA: Diagnosis not present

## 2012-06-26 DIAGNOSIS — R609 Edema, unspecified: Secondary | ICD-10-CM | POA: Diagnosis not present

## 2012-06-26 DIAGNOSIS — M545 Low back pain, unspecified: Secondary | ICD-10-CM | POA: Diagnosis not present

## 2012-07-19 DIAGNOSIS — E782 Mixed hyperlipidemia: Secondary | ICD-10-CM | POA: Diagnosis not present

## 2012-07-19 DIAGNOSIS — I2510 Atherosclerotic heart disease of native coronary artery without angina pectoris: Secondary | ICD-10-CM | POA: Diagnosis not present

## 2012-07-24 DIAGNOSIS — Z23 Encounter for immunization: Secondary | ICD-10-CM | POA: Diagnosis not present

## 2012-09-05 ENCOUNTER — Other Ambulatory Visit: Payer: Self-pay | Admitting: Family Medicine

## 2012-09-05 DIAGNOSIS — M899 Disorder of bone, unspecified: Secondary | ICD-10-CM

## 2012-09-05 DIAGNOSIS — I10 Essential (primary) hypertension: Secondary | ICD-10-CM | POA: Diagnosis not present

## 2012-09-05 DIAGNOSIS — E782 Mixed hyperlipidemia: Secondary | ICD-10-CM | POA: Diagnosis not present

## 2012-09-05 DIAGNOSIS — R7309 Other abnormal glucose: Secondary | ICD-10-CM | POA: Diagnosis not present

## 2012-09-05 DIAGNOSIS — K7689 Other specified diseases of liver: Secondary | ICD-10-CM | POA: Diagnosis not present

## 2012-10-15 ENCOUNTER — Other Ambulatory Visit: Payer: Medicare Other

## 2012-11-06 ENCOUNTER — Ambulatory Visit
Admission: RE | Admit: 2012-11-06 | Discharge: 2012-11-06 | Disposition: A | Discharge status: Home / Self Care | Payer: Medicare Other | Source: Ambulatory Visit | Attending: Family Medicine | Admitting: Family Medicine

## 2012-11-06 DIAGNOSIS — M949 Disorder of cartilage, unspecified: Secondary | ICD-10-CM | POA: Diagnosis not present

## 2012-11-06 DIAGNOSIS — M899 Disorder of bone, unspecified: Secondary | ICD-10-CM | POA: Diagnosis not present

## 2012-11-09 DIAGNOSIS — E782 Mixed hyperlipidemia: Secondary | ICD-10-CM | POA: Diagnosis not present

## 2012-11-13 DIAGNOSIS — R6889 Other general symptoms and signs: Secondary | ICD-10-CM | POA: Diagnosis not present

## 2012-11-13 DIAGNOSIS — I495 Sick sinus syndrome: Secondary | ICD-10-CM | POA: Diagnosis not present

## 2012-11-13 DIAGNOSIS — G4733 Obstructive sleep apnea (adult) (pediatric): Secondary | ICD-10-CM | POA: Diagnosis not present

## 2012-11-13 DIAGNOSIS — R5381 Other malaise: Secondary | ICD-10-CM | POA: Diagnosis not present

## 2012-11-13 DIAGNOSIS — E782 Mixed hyperlipidemia: Secondary | ICD-10-CM | POA: Diagnosis not present

## 2012-11-22 DIAGNOSIS — M549 Dorsalgia, unspecified: Secondary | ICD-10-CM | POA: Diagnosis not present

## 2012-11-22 DIAGNOSIS — F3289 Other specified depressive episodes: Secondary | ICD-10-CM | POA: Diagnosis not present

## 2012-11-22 DIAGNOSIS — F329 Major depressive disorder, single episode, unspecified: Secondary | ICD-10-CM | POA: Diagnosis not present

## 2012-12-21 DIAGNOSIS — F3289 Other specified depressive episodes: Secondary | ICD-10-CM | POA: Diagnosis not present

## 2012-12-21 DIAGNOSIS — H9209 Otalgia, unspecified ear: Secondary | ICD-10-CM | POA: Diagnosis not present

## 2012-12-21 DIAGNOSIS — I959 Hypotension, unspecified: Secondary | ICD-10-CM | POA: Diagnosis not present

## 2012-12-21 DIAGNOSIS — K7689 Other specified diseases of liver: Secondary | ICD-10-CM | POA: Diagnosis not present

## 2012-12-21 DIAGNOSIS — F329 Major depressive disorder, single episode, unspecified: Secondary | ICD-10-CM | POA: Diagnosis not present

## 2013-01-01 DIAGNOSIS — K7689 Other specified diseases of liver: Secondary | ICD-10-CM | POA: Diagnosis not present

## 2013-01-18 DIAGNOSIS — Z6830 Body mass index (BMI) 30.0-30.9, adult: Secondary | ICD-10-CM | POA: Diagnosis not present

## 2013-01-18 DIAGNOSIS — Z1331 Encounter for screening for depression: Secondary | ICD-10-CM | POA: Diagnosis not present

## 2013-01-18 DIAGNOSIS — F3289 Other specified depressive episodes: Secondary | ICD-10-CM | POA: Diagnosis not present

## 2013-01-18 DIAGNOSIS — F329 Major depressive disorder, single episode, unspecified: Secondary | ICD-10-CM | POA: Diagnosis not present

## 2013-02-26 DIAGNOSIS — R6889 Other general symptoms and signs: Secondary | ICD-10-CM | POA: Diagnosis not present

## 2013-02-26 DIAGNOSIS — E782 Mixed hyperlipidemia: Secondary | ICD-10-CM | POA: Diagnosis not present

## 2013-02-26 DIAGNOSIS — I2510 Atherosclerotic heart disease of native coronary artery without angina pectoris: Secondary | ICD-10-CM | POA: Diagnosis not present

## 2013-02-26 DIAGNOSIS — R5383 Other fatigue: Secondary | ICD-10-CM | POA: Diagnosis not present

## 2013-02-26 DIAGNOSIS — R5381 Other malaise: Secondary | ICD-10-CM | POA: Diagnosis not present

## 2013-03-06 ENCOUNTER — Other Ambulatory Visit: Payer: Self-pay | Admitting: Family Medicine

## 2013-03-06 DIAGNOSIS — Z1231 Encounter for screening mammogram for malignant neoplasm of breast: Secondary | ICD-10-CM

## 2013-03-06 DIAGNOSIS — R7309 Other abnormal glucose: Secondary | ICD-10-CM | POA: Diagnosis not present

## 2013-03-06 DIAGNOSIS — Z9181 History of falling: Secondary | ICD-10-CM | POA: Diagnosis not present

## 2013-03-06 DIAGNOSIS — I10 Essential (primary) hypertension: Secondary | ICD-10-CM | POA: Diagnosis not present

## 2013-03-06 DIAGNOSIS — Z0000 Encounter for general adult medical examination without abnormal findings: Secondary | ICD-10-CM | POA: Diagnosis not present

## 2013-03-06 DIAGNOSIS — E782 Mixed hyperlipidemia: Secondary | ICD-10-CM | POA: Diagnosis not present

## 2013-03-11 ENCOUNTER — Other Ambulatory Visit (HOSPITAL_COMMUNITY): Payer: Self-pay | Admitting: Cardiovascular Disease

## 2013-03-11 ENCOUNTER — Ambulatory Visit (HOSPITAL_COMMUNITY): Payer: Medicare Other | Attending: Cardiology | Admitting: Radiology

## 2013-03-11 DIAGNOSIS — I2510 Atherosclerotic heart disease of native coronary artery without angina pectoris: Secondary | ICD-10-CM | POA: Diagnosis not present

## 2013-03-11 DIAGNOSIS — I079 Rheumatic tricuspid valve disease, unspecified: Secondary | ICD-10-CM | POA: Insufficient documentation

## 2013-03-11 DIAGNOSIS — Z1231 Encounter for screening mammogram for malignant neoplasm of breast: Secondary | ICD-10-CM

## 2013-03-11 DIAGNOSIS — I25810 Atherosclerosis of coronary artery bypass graft(s) without angina pectoris: Secondary | ICD-10-CM

## 2013-03-11 DIAGNOSIS — E669 Obesity, unspecified: Secondary | ICD-10-CM | POA: Diagnosis not present

## 2013-03-11 NOTE — Progress Notes (Signed)
Echocardiogram performed.  

## 2013-03-12 ENCOUNTER — Encounter (HOSPITAL_COMMUNITY): Payer: Self-pay | Admitting: Cardiovascular Disease

## 2013-03-27 ENCOUNTER — Ambulatory Visit
Admission: RE | Admit: 2013-03-27 | Discharge: 2013-03-27 | Disposition: A | Discharge status: Home / Self Care | Payer: Medicare Other | Source: Ambulatory Visit | Attending: Family Medicine | Admitting: Family Medicine

## 2013-03-27 DIAGNOSIS — Z1231 Encounter for screening mammogram for malignant neoplasm of breast: Secondary | ICD-10-CM | POA: Diagnosis not present

## 2013-04-22 DIAGNOSIS — IMO0002 Reserved for concepts with insufficient information to code with codable children: Secondary | ICD-10-CM | POA: Diagnosis not present

## 2013-04-22 DIAGNOSIS — M1710 Unilateral primary osteoarthritis, unspecified knee: Secondary | ICD-10-CM | POA: Diagnosis not present

## 2013-05-20 DIAGNOSIS — M545 Low back pain, unspecified: Secondary | ICD-10-CM | POA: Diagnosis not present

## 2013-05-23 DIAGNOSIS — M48061 Spinal stenosis, lumbar region without neurogenic claudication: Secondary | ICD-10-CM | POA: Diagnosis not present

## 2013-05-23 DIAGNOSIS — R262 Difficulty in walking, not elsewhere classified: Secondary | ICD-10-CM | POA: Diagnosis not present

## 2013-05-23 DIAGNOSIS — M545 Low back pain, unspecified: Secondary | ICD-10-CM | POA: Diagnosis not present

## 2013-05-24 DIAGNOSIS — M545 Low back pain, unspecified: Secondary | ICD-10-CM | POA: Diagnosis not present

## 2013-05-24 DIAGNOSIS — R262 Difficulty in walking, not elsewhere classified: Secondary | ICD-10-CM | POA: Diagnosis not present

## 2013-05-24 DIAGNOSIS — M48061 Spinal stenosis, lumbar region without neurogenic claudication: Secondary | ICD-10-CM | POA: Diagnosis not present

## 2013-05-27 DIAGNOSIS — M48061 Spinal stenosis, lumbar region without neurogenic claudication: Secondary | ICD-10-CM | POA: Diagnosis not present

## 2013-05-27 DIAGNOSIS — M545 Low back pain, unspecified: Secondary | ICD-10-CM | POA: Diagnosis not present

## 2013-05-27 DIAGNOSIS — R262 Difficulty in walking, not elsewhere classified: Secondary | ICD-10-CM | POA: Diagnosis not present

## 2013-05-29 ENCOUNTER — Encounter (HOSPITAL_COMMUNITY): Payer: Self-pay | Admitting: Emergency Medicine

## 2013-05-29 ENCOUNTER — Inpatient Hospital Stay (HOSPITAL_COMMUNITY)
Admission: EM | Admit: 2013-05-29 | Discharge: 2013-05-31 | DRG: 311 | Disposition: A | Discharge status: Home / Self Care | Payer: Medicare Other | Attending: Cardiovascular Disease | Admitting: Cardiovascular Disease

## 2013-05-29 ENCOUNTER — Emergency Department (HOSPITAL_COMMUNITY): Payer: Medicare Other

## 2013-05-29 DIAGNOSIS — D72829 Elevated white blood cell count, unspecified: Secondary | ICD-10-CM | POA: Diagnosis not present

## 2013-05-29 DIAGNOSIS — G4733 Obstructive sleep apnea (adult) (pediatric): Secondary | ICD-10-CM | POA: Diagnosis present

## 2013-05-29 DIAGNOSIS — R072 Precordial pain: Secondary | ICD-10-CM | POA: Diagnosis not present

## 2013-05-29 DIAGNOSIS — G4730 Sleep apnea, unspecified: Secondary | ICD-10-CM | POA: Diagnosis not present

## 2013-05-29 DIAGNOSIS — F3289 Other specified depressive episodes: Secondary | ICD-10-CM | POA: Diagnosis present

## 2013-05-29 DIAGNOSIS — I10 Essential (primary) hypertension: Secondary | ICD-10-CM | POA: Diagnosis not present

## 2013-05-29 DIAGNOSIS — I2510 Atherosclerotic heart disease of native coronary artery without angina pectoris: Secondary | ICD-10-CM | POA: Diagnosis present

## 2013-05-29 DIAGNOSIS — I498 Other specified cardiac arrhythmias: Secondary | ICD-10-CM | POA: Diagnosis present

## 2013-05-29 DIAGNOSIS — I252 Old myocardial infarction: Secondary | ICD-10-CM | POA: Diagnosis not present

## 2013-05-29 DIAGNOSIS — I509 Heart failure, unspecified: Secondary | ICD-10-CM | POA: Diagnosis present

## 2013-05-29 DIAGNOSIS — E785 Hyperlipidemia, unspecified: Secondary | ICD-10-CM | POA: Diagnosis not present

## 2013-05-29 DIAGNOSIS — Z79899 Other long term (current) drug therapy: Secondary | ICD-10-CM

## 2013-05-29 DIAGNOSIS — F439 Reaction to severe stress, unspecified: Secondary | ICD-10-CM

## 2013-05-29 DIAGNOSIS — I5181 Takotsubo syndrome: Secondary | ICD-10-CM | POA: Diagnosis present

## 2013-05-29 DIAGNOSIS — F329 Major depressive disorder, single episode, unspecified: Secondary | ICD-10-CM | POA: Diagnosis present

## 2013-05-29 DIAGNOSIS — I200 Unstable angina: Secondary | ICD-10-CM

## 2013-05-29 DIAGNOSIS — Z6841 Body Mass Index (BMI) 40.0 and over, adult: Secondary | ICD-10-CM | POA: Diagnosis not present

## 2013-05-29 DIAGNOSIS — R079 Chest pain, unspecified: Secondary | ICD-10-CM

## 2013-05-29 DIAGNOSIS — Z96659 Presence of unspecified artificial knee joint: Secondary | ICD-10-CM | POA: Diagnosis not present

## 2013-05-29 DIAGNOSIS — R1013 Epigastric pain: Secondary | ICD-10-CM | POA: Diagnosis not present

## 2013-05-29 DIAGNOSIS — Z733 Stress, not elsewhere classified: Secondary | ICD-10-CM

## 2013-05-29 HISTORY — DX: Major depressive disorder, single episode, unspecified: F32.9

## 2013-05-29 HISTORY — DX: Obesity, unspecified: E66.9

## 2013-05-29 HISTORY — DX: Hyperlipidemia, unspecified: E78.5

## 2013-05-29 HISTORY — DX: Sleep apnea, unspecified: G47.30

## 2013-05-29 HISTORY — DX: Essential (primary) hypertension: I10

## 2013-05-29 HISTORY — DX: Takotsubo syndrome: I51.81

## 2013-05-29 HISTORY — DX: Depression, unspecified: F32.A

## 2013-05-29 LAB — CBC
HCT: 44.7 % (ref 36.0–46.0)
Hemoglobin: 15.8 g/dL — ABNORMAL HIGH (ref 12.0–15.0)
MCH: 34.7 pg — ABNORMAL HIGH (ref 26.0–34.0)
MCHC: 35.3 g/dL (ref 30.0–36.0)
MCV: 98.2 fL (ref 78.0–100.0)
Platelets: 238 10*3/uL (ref 150–400)
RBC: 4.55 MIL/uL (ref 3.87–5.11)
RDW: 12.5 % (ref 11.5–15.5)
WBC: 14 10*3/uL — ABNORMAL HIGH (ref 4.0–10.5)

## 2013-05-29 LAB — COMPREHENSIVE METABOLIC PANEL
ALT: 15 U/L (ref 0–35)
AST: 21 U/L (ref 0–37)
Albumin: 3.4 g/dL — ABNORMAL LOW (ref 3.5–5.2)
Alkaline Phosphatase: 73 U/L (ref 39–117)
BUN: 19 mg/dL (ref 6–23)
CO2: 30 mEq/L (ref 19–32)
Calcium: 10 mg/dL (ref 8.4–10.5)
Chloride: 102 mEq/L (ref 96–112)
Creatinine, Ser: 1.06 mg/dL (ref 0.50–1.10)
GFR calc Af Amer: 62 mL/min — ABNORMAL LOW (ref >90)
GFR calc non Af Amer: 53 mL/min — ABNORMAL LOW (ref >90)
Glucose, Bld: 78 mg/dL (ref 70–99)
Potassium: 4.5 mEq/L (ref 3.5–5.1)
Sodium: 141 mEq/L (ref 135–145)
Total Bilirubin: 0.6 mg/dL (ref 0.3–1.2)
Total Protein: 6.3 g/dL (ref 6.0–8.3)

## 2013-05-29 LAB — POCT I-STAT TROPONIN I: Troponin i, poc: 0 ng/mL (ref 0.00–0.08)

## 2013-05-29 LAB — TROPONIN I: Troponin I: <0.3 ng/mL (ref <0.30)

## 2013-05-29 LAB — MRSA PCR SCREENING: MRSA by PCR: NEGATIVE

## 2013-05-29 MED ORDER — ATORVASTATIN CALCIUM 40 MG PO TABS
40.0000 mg | ORAL_TABLET | Freq: Every day | ORAL | Status: DC
  Administered 2013-05-30: 40 mg via ORAL
  Filled 2013-05-29 (×2): qty 1

## 2013-05-29 MED ORDER — LISINOPRIL 10 MG PO TABS
10.0000 mg | ORAL_TABLET | Freq: Every day | ORAL | Status: DC
  Administered 2013-05-30 – 2013-05-31 (×2): 10 mg via ORAL
  Filled 2013-05-29 (×2): qty 1

## 2013-05-29 MED ORDER — VITAMIN C 250 MG PO TABS
250.0000 mg | ORAL_TABLET | Freq: Every day | ORAL | Status: DC
  Administered 2013-05-30 – 2013-05-31 (×2): 250 mg via ORAL
  Filled 2013-05-29 (×2): qty 1

## 2013-05-29 MED ORDER — SODIUM CHLORIDE 0.9 % IV SOLN
INTRAVENOUS | Status: DC
  Administered 2013-05-29: 10 mL/h via INTRAVENOUS

## 2013-05-29 MED ORDER — ZOLPIDEM TARTRATE 5 MG PO TABS: 5.0000 mg | ORAL_TABLET | Freq: Every evening | ORAL | Status: DC | PRN

## 2013-05-29 MED ORDER — PANTOPRAZOLE SODIUM 40 MG PO TBEC
40.0000 mg | DELAYED_RELEASE_TABLET | Freq: Every day | ORAL | Status: DC
  Administered 2013-05-30 – 2013-05-31 (×2): 40 mg via ORAL
  Filled 2013-05-29 (×2): qty 1

## 2013-05-29 MED ORDER — DIAZEPAM 5 MG PO TABS
5.0000 mg | ORAL_TABLET | Freq: Once | ORAL | Status: AC
  Administered 2013-05-29: 5 mg via ORAL
  Filled 2013-05-29: qty 1

## 2013-05-29 MED ORDER — NITROGLYCERIN IN D5W 200-5 MCG/ML-% IV SOLN
3.0000 ug/min | INTRAVENOUS | Status: DC
  Administered 2013-05-29: 3 ug/min via INTRAVENOUS
  Filled 2013-05-29: qty 250

## 2013-05-29 MED ORDER — ALPRAZOLAM 0.25 MG PO TABS
0.5000 mg | ORAL_TABLET | Freq: Three times a day (TID) | ORAL | Status: DC | PRN
  Administered 2013-05-30: 0.5 mg via ORAL
  Filled 2013-05-29: qty 1

## 2013-05-29 MED ORDER — TRAMADOL HCL 50 MG PO TABS
50.0000 mg | ORAL_TABLET | Freq: Four times a day (QID) | ORAL | Status: DC | PRN
  Administered 2013-05-30: 50 mg via ORAL
  Filled 2013-05-29: qty 1

## 2013-05-29 MED ORDER — ADULT MULTIVITAMIN W/MINERALS CH
1.0000 | ORAL_TABLET | Freq: Every day | ORAL | Status: DC
  Administered 2013-05-30 – 2013-05-31 (×2): 1 via ORAL
  Filled 2013-05-29 (×2): qty 1

## 2013-05-29 MED ORDER — FUROSEMIDE 20 MG PO TABS
20.0000 mg | ORAL_TABLET | Freq: Every day | ORAL | Status: DC
  Administered 2013-05-30 – 2013-05-31 (×2): 20 mg via ORAL
  Filled 2013-05-29 (×2): qty 1

## 2013-05-29 MED ORDER — MORPHINE SULFATE 2 MG/ML IJ SOLN
2.0000 mg | Freq: Once | INTRAMUSCULAR | Status: AC
  Administered 2013-05-29: 2 mg via INTRAVENOUS
  Filled 2013-05-29: qty 1

## 2013-05-29 MED ORDER — CALCIUM CARBONATE ANTACID 500 MG PO CHEW
6.0000 | CHEWABLE_TABLET | Freq: Every day | ORAL | Status: DC | PRN
  Filled 2013-05-29: qty 6

## 2013-05-29 MED ORDER — HYDROMORPHONE HCL PF 1 MG/ML IJ SOLN
1.0000 mg | INTRAMUSCULAR | Status: DC | PRN
  Administered 2013-05-29: 1 mg via INTRAVENOUS
  Administered 2013-05-30: 0.5 mg via INTRAVENOUS
  Filled 2013-05-29 (×2): qty 1

## 2013-05-29 MED ORDER — OMEGA-3-ACID ETHYL ESTERS 1 G PO CAPS
1.0000 g | ORAL_CAPSULE | Freq: Two times a day (BID) | ORAL | Status: DC
  Administered 2013-05-30 – 2013-05-31 (×3): 1 g via ORAL
  Filled 2013-05-29 (×5): qty 1

## 2013-05-29 MED ORDER — ASPIRIN EC 325 MG PO TBEC
325.0000 mg | DELAYED_RELEASE_TABLET | Freq: Once | ORAL | Status: AC
  Administered 2013-05-29: 325 mg via ORAL
  Filled 2013-05-29: qty 1

## 2013-05-29 MED ORDER — CITALOPRAM HYDROBROMIDE 10 MG PO TABS
10.0000 mg | ORAL_TABLET | Freq: Every day | ORAL | Status: DC
  Administered 2013-05-30 – 2013-05-31 (×2): 10 mg via ORAL
  Filled 2013-05-29 (×2): qty 1

## 2013-05-29 MED ORDER — NITROGLYCERIN 0.4 MG SL SUBL
0.4000 mg | SUBLINGUAL_TABLET | SUBLINGUAL | Status: DC | PRN
  Administered 2013-05-29: 0.4 mg via SUBLINGUAL
  Filled 2013-05-29: qty 25

## 2013-05-29 MED ORDER — ACETAMINOPHEN 325 MG PO TABS: 650.0000 mg | ORAL_TABLET | ORAL | Status: DC | PRN

## 2013-05-29 MED ORDER — METOPROLOL SUCCINATE 12.5 MG HALF TABLET
12.5000 mg | ORAL_TABLET | Freq: Every evening | ORAL | Status: DC
  Filled 2013-05-29 (×2): qty 1

## 2013-05-29 NOTE — ED Provider Notes (Signed)
CSN: [id number]     Arrival date & time 05/29/13  1408 History     First MD Initiated Contact with Patient 05/29/13 1445     Chief Complaint  Patient presents with  . Chest Pain   (Consider location/radiation/quality/duration/timing/severity/associated sxs/prior Treatment) Patient is a 67 y.o. female presenting with chest pain. The history is provided by the patient.  Chest Pain Pain location:  Substernal area and epigastric Pain quality: burning and pressure   Pain radiates to:  L jaw, L shoulder and L arm Pain radiates to the back: no   Pain severity:  Moderate Onset quality:  Sudden Duration:  1 day Timing:  Constant Progression:  Worsening Chronicity:  New Context: not breathing   Relieved by:  Nothing Worsened by:  Nothing tried Ineffective treatments:  None tried Associated symptoms: no abdominal pain, no back pain, no cough, no dizziness, no fatigue, no fever, no headache, no nausea, no shortness of breath and not vomiting     Past Medical History  Diagnosis Date  . Obesity   . Coronary artery disease     broken heart syndrone  . Depression    No past surgical history on file. No family history on file. History  Substance Use Topics  . Smoking status: Never Smoker   . Smokeless tobacco: Not on file  . Alcohol Use: Yes   OB History   Grav Para Term Preterm Abortions TAB SAB Ect Mult Living                 Review of Systems  Constitutional: Negative for fever and fatigue.  HENT: Negative for congestion, drooling and neck pain.   Eyes: Negative for pain.  Respiratory: Negative for cough and shortness of breath.   Cardiovascular: Positive for chest pain.  Gastrointestinal: Negative for nausea, vomiting, abdominal pain and diarrhea.  Genitourinary: Negative for dysuria and hematuria.  Musculoskeletal: Negative for back pain and gait problem.  Skin: Negative for color change.  Neurological: Negative for dizziness and headaches.  Hematological: Negative for  adenopathy.  Psychiatric/Behavioral: Negative for behavioral problems.  All other systems reviewed and are negative.    Allergies  Macrobid and Sulfa antibiotics  Home Medications   Current Outpatient Rx  Name  Route  Sig  Dispense  Refill  . Ascorbic Acid (VITAMIN C PO)   Oral   Take 1 tablet by mouth 2 (two) times daily.         . calcium carbonate (TUMS - DOSED IN MG ELEMENTAL CALCIUM) 500 MG chewable tablet   Oral   Chew 6 tablets by mouth daily as needed for heartburn.         [name] CALCIUM PO   Oral   Take 1,000 mg by mouth.         . citalopram (CELEXA) 10 MG tablet   Oral   Take 10 mg by mouth daily.         . furosemide (LASIX) 20 MG tablet   Oral   Take 20 mg by mouth.         [name] lisinopril (PRINIVIL,ZESTRIL) 10 MG tablet   Oral   Take 10 mg by mouth daily.         . metoprolol succinate (TOPROL-XL) 25 MG 24 hr tablet   Oral   Take 25 mg by mouth every evening.         . Multiple Vitamin (MULTIVITAMIN WITH MINERALS) TABS tablet   Oral   Take 1 tablet by mouth  daily.         . omega-3 acid ethyl esters (LOVAZA) 1 G capsule   Oral   Take 1 g by mouth 2 (two) times daily.         [name] omeprazole (PRILOSEC OTC) 20 MG tablet   Oral   Take 20 mg by mouth daily as needed.         . simvastatin (ZOCOR) 80 MG tablet   Oral   Take 80 mg by mouth at bedtime.         [name] VITAMIN D, ERGOCALCIFEROL, PO   Oral   Take 1 tablet by mouth every evening.          BP 120/54  Pulse 55  Temp(Src) 98.3 F (36.8 C)  Resp 16  SpO2 95% Physical Exam  Nursing note and vitals reviewed. Constitutional: She is oriented to person, place, and time. She appears well-developed and well-nourished.  Obese.  HENT:  Head: Normocephalic.  Mouth/Throat: No oropharyngeal exudate.  Eyes: Conjunctivae and EOM are normal. Pupils are equal, round, and reactive to light.  Neck: Normal range of motion. Neck supple.  Cardiovascular: Normal rate, regular rhythm,  normal heart sounds and intact distal pulses.  Exam reveals no gallop and no friction rub.   No murmur heard. Pulmonary/Chest: Effort normal and breath sounds normal. No respiratory distress. She has no wheezes.  Abdominal: Soft. Bowel sounds are normal. There is no tenderness. There is no rebound and no guarding.  Musculoskeletal: Normal range of motion. She exhibits no edema and no tenderness.  Neurological: She is alert and oriented to person, place, and time.  Skin: Skin is warm and dry.  Psychiatric: She has a normal mood and affect. Her behavior is normal.    ED Course   Procedures (including critical care time)  Labs Reviewed  CBC - Abnormal; Notable for the following:    WBC 14.0 (*)    Hemoglobin 15.8 (*)    MCH 34.7 (*)    All other components within normal limits  COMPREHENSIVE METABOLIC PANEL - Abnormal; Notable for the following:    Albumin 3.4 (*)    GFR calc non Af Amer 53 (*)    GFR calc Af Amer 62 (*)    All other components within normal limits  MRSA PCR SCREENING  TROPONIN I  TROPONIN I  POCT I-STAT TROPONIN I   Dg Chest Portable 1 View  05/29/2013   *RADIOLOGY REPORT*  Clinical Data: Chest pain  PORTABLE CHEST - 1 VIEW  Comparison: Prior chest x-ray 03/31/2012  Findings: Stable appearance of the chest with borderline cardiomegaly and diffuse prominence of the interstitial markings. No overt pulmonary edema.  Limited evaluation secondary to portable technique and patient body habitus.  IMPRESSION: No acute cardiopulmonary process, stable chest x-ray.   Original Report Authenticated By: Malachy Moan, M.D.   1. Unstable angina   2. Leukocytosis   3. HTN (hypertension)   4. Situational stress   5. Sleep apnea   6. Takotsubo syndrome   7. Morbid obesity with BMI of 50.0-59.9, adult   8. Chest pain      Date: 05/29/2013  Rate: 57  Rhythm: sinus bradycardia  QRS Axis: normal  Intervals: normal  ST/T Wave abnormalities: normal  Conduction  Disutrbances:none  Narrative Interpretation: No ST or T wave changes cw ischemia  Old EKG Reviewed: none available   MDM  3:11 PM 67 y.o. female w hx of takotsubo cardiomyopathy pw cp that began yesterday while at  rest and has persisted. Mild sob. Wells neg. AFVSS here.  Pt notes inc stress 2 days ago. Will perform cardiac workup. Will give nitro, asa.     Junius Argyle, MD 05/30/13 [phone]

## 2013-05-29 NOTE — Progress Notes (Signed)
Pt continues to c/o chest pain since arrived from the er.  Rates pain a 5/10 at present time.  ekg done and ok.  Called and notified dr. Shirlee Latch. Continue to monitor pt closely.

## 2013-05-29 NOTE — H&P (Signed)
Patient ID: Alexis Edwards MRN: [id number], DOB/AGE: 68-Mar-1947   Admit date: 05/29/2013   Primary Physician: Ailene Ravel, MD Primary Cardiologist: Dr Alanda Amass  HPI: The patient is a very pleasant 67 year old white married mother of three (two living children), two grandchildren, and three step grandchildren. 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 She was hospitalized at [facility] in [place] from April 11 to February 06 2012 with an ST  elevation myocardial infarction. It appeared she had Takotsubo type cardiomyopathy with apical ballooning and normal coronary arteries. She has been treated medically since that time. An echo done May 2014 showed normal LVF, previous echo from April 2013 showed an EF of 45% with an apical WMA.         She is seen now in the ER with complaints of SSCP for the last 48 hours. She tried taking Tums and Prilosec OTC without relief. She called Dr Cathlean Cower and was instructed to come to the ER. She describes a mid sternal "burning" and "tightness" with some radiation to her arm and jaw. NTG seemed to help in the ER. Her EKG shows poor ant RW which is not new. She admits to being under increased stress at home due some issues with her sister who is apperently mentally retarded. She was tearful during the exam. She is admitted now to R/O MI.   Problem List: Past Medical History  Diagnosis Date  . Obesity     BMI 55  . Takotsubo syndrome April 2013    apical ballooning, Nl cor- [place]  . Depression   . HTN (hypertension)   . Dyslipidemia   . Sleep apnea     C-pap intol    Past Surgical History  Procedure Laterality Date  . Cholecystectomy  2/10  . Total knee arthroplasty  5/12    Lt  . Abdominal hysterectomy       Allergies:  Allergies  Allergen Reactions  . Macrobid (Nitrofurantoin) Itching and Rash  . Sulfa Antibiotics Itching and Rash      Home Medications Current Facility-Administered Medications  Medication Dose Route Frequency Provider Last Rate Last Dose  . nitroGLYCERIN (NITROSTAT) SL tablet 0.4 mg  0.4 mg Sublingual Q5 min PRN Junius Argyle, MD   0.4 mg at 05/29/13 1528  . nitroGLYCERIN 0.2 mg/mL in dextrose 5 % infusion  3 mcg/min Intravenous Titrated Abelino Derrick, PA-C       Current Outpatient Prescriptions  Medication Sig Dispense Refill  . Ascorbic Acid (VITAMIN C PO) Take 1 tablet by mouth 2 (two) times daily.      . calcium carbonate (TUMS - DOSED IN MG ELEMENTAL CALCIUM) 500 MG chewable tablet Chew 6 tablets by mouth daily as needed for heartburn.      [name] CALCIUM PO Take 1,000 mg by mouth.      . citalopram (CELEXA) 10 MG tablet Take 10 mg by mouth daily.      . furosemide (LASIX) 20 MG tablet Take 20 mg by mouth.      [name] lisinopril (PRINIVIL,ZESTRIL) 10 MG tablet Take 10 mg by mouth daily.      . metoprolol succinate (TOPROL-XL) 25 MG 24 hr tablet Take 25 mg by mouth every evening.      . Multiple Vitamin (MULTIVITAMIN WITH MINERALS) TABS tablet Take 1 tablet by mouth daily.      [name] omega-3 acid ethyl esters (LOVAZA) 1 G capsule Take 1 g by  mouth 2 (two) times daily.      [name] omeprazole (PRILOSEC OTC) 20 MG tablet Take 20 mg by mouth daily as needed.      . simvastatin (ZOCOR) 80 MG tablet Take 80 mg by mouth at bedtime.      [name] VITAMIN D, ERGOCALCIFEROL, PO Take 1 tablet by mouth every evening.         [facility] Brother has a stent at 80 Mother has DM Father died of GSW   History   Social History  . Marital Status: Married    Spouse Name: N/A    Number of Children: N/A  . Years of Education: N/A   Occupational History  . Not on file.   Social History Main Topics  . Smoking status: Never Smoker   . Smokeless tobacco: Not on file  . Alcohol Use: Yes  . Drug Use: Not on file  . Sexually Active: Not on file   Other Topics Concern  . Not on file   Social History Narrative  . No narrative  on file     Review of Systems: General: negative for chills, fever, night sweats or weight changes.  Cardiovascular: dyspnea on exertion, edema, orthopnea, palpitations, paroxysmal nocturnal dyspnea or shortness of breath Dermatological: negative for rash Respiratory: negative for cough or wheezing Urologic: negative for hematuria, remote kidney stones Abdominal: negative for nausea, vomiting, diarrhea, bright red blood per rectum, melena, or hematemesis, no history of GI bleeding Neurologic: negative for visual changes, syncope, or dizziness All other systems reviewed and are otherwise negative except as noted above. She finished a steroid dose pack for back pain 2 days ago  Physical Exam: Blood pressure 91/45, pulse 50, temperature 98.3 F (36.8 C), resp. rate 21, SpO2 95.00%.  General appearance: alert, cooperative, no distress and morbidly obese Neck: no carotid bruit and no JVD Lungs: clear to auscultation bilaterally Heart: regular rate and rhythm Abdomen: morbid obesity Extremities: extremities normal, atraumatic, no cyanosis or edema Pulses: 2+ and symmetric Skin: Skin color, texture, turgor normal. No rashes or lesions Neurologic: Grossly normal    Labs:   Results for orders placed during the hospital encounter of 05/29/13 (from the past 24 hour(s))  CBC     Status: Abnormal   Collection Time    05/29/13  2:26 PM      Result Value Range   WBC 14.0 (*) 4.0 - 10.5 K/uL   RBC 4.55  3.87 - 5.11 MIL/uL   Hemoglobin 15.8 (*) 12.0 - 15.0 g/dL   HCT 16.1  09.6 - 04.5 %   MCV 98.2  78.0 - 100.0 fL   MCH 34.7 (*) 26.0 - 34.0 pg   MCHC 35.3  30.0 - 36.0 g/dL   RDW 40.9  81.1 - 91.4 %   Platelets 238  150 - 400 K/uL  COMPREHENSIVE METABOLIC PANEL     Status: Abnormal   Collection Time    05/29/13  2:26 PM      Result Value Range   Sodium 141  135 - 145 mEq/L   Potassium 4.5  3.5 - 5.1 mEq/L   Chloride 102  96 - 112 mEq/L   CO2 30  19 - 32 mEq/L   Glucose, Bld 78  70  - 99 mg/dL   BUN 19  6 - 23 mg/dL   Creatinine, Ser 7.82  0.50 - 1.10 mg/dL   Calcium 95.6  8.4 - 21.3 mg/dL   Total Protein 6.3  6.0 - 8.3 g/dL  Albumin 3.4 (*) 3.5 - 5.2 g/dL   AST 21  0 - 37 U/L   ALT 15  0 - 35 U/L   Alkaline Phosphatase 73  39 - 117 U/L   Total Bilirubin 0.6  0.3 - 1.2 mg/dL   GFR calc non Af Amer 53 (*) >90 mL/min   GFR calc Af Amer 62 (*) >90 mL/min  POCT I-STAT TROPONIN I     Status: None   Collection Time    05/29/13  4:13 PM      Result Value Range   Troponin i, poc 0.00  0.00 - 0.08 ng/mL   Comment 3              Radiology/Studies: Dg Chest Portable 1 View  05/29/2013   *RADIOLOGY REPORT*  Clinical Data: Chest pain  PORTABLE CHEST - 1 VIEW  Comparison: Prior chest x-ray 03/31/2012  Findings: Stable appearance of the chest with borderline cardiomegaly and diffuse prominence of the interstitial markings. No overt pulmonary edema.  Limited evaluation secondary to portable technique and patient body habitus.  IMPRESSION: No acute cardiopulmonary process, stable chest x-ray.   Original Report Authenticated By: Malachy Moan, M.D.    EKG:NSR, poor anterior RW  ASSESSMENT AND PLAN:   Principal Problem:   Unstable angina  Active Problems:   Takotsubo syndrome- documented by cath/ echo at [facility] April 2013, echo 5/14 showed NL LVF   Situational stress   Morbid obesity with BMI of 50.0-59.9, adult   Sleep apnea- C-pap intol   HTN (hypertension)   Dyslipidemia   PLAN: Admit, R/O MI. Rx with IV NTG, Lovenox, PPI, check echo in am.   SignedAbelino Derrick, PA-C 05/29/2013, 6:38 PM Agree with note written by Corine Shelter PAC  Pt of RAW's with nl cors in the past and documented Takasubo Syndrome. Other probs as outlined. Under a lot of stress recently and developed constant mid substernal CP unrelieved by antacids but somewhat relieved by NTG and MSO4. Currently pain free. EKG w/o acute changes. Exam benign. First trop neg. Will admit to stepdown, cycle  enz, iv hep/ntg. Will get 2D for WMA (recent 2D nl to compare).  Runell Gess 05/29/2013 7:08 PM

## 2013-05-29 NOTE — ED Notes (Signed)
Cp started yesterday and had some sob that has cont to today feels like heart burn has taken her heart burn meds and they are not helping hurts under her left arm and neck pain

## 2013-05-30 DIAGNOSIS — Z6841 Body Mass Index (BMI) 40.0 and over, adult: Secondary | ICD-10-CM | POA: Diagnosis not present

## 2013-05-30 DIAGNOSIS — G4730 Sleep apnea, unspecified: Secondary | ICD-10-CM | POA: Diagnosis not present

## 2013-05-30 DIAGNOSIS — I200 Unstable angina: Secondary | ICD-10-CM

## 2013-05-30 DIAGNOSIS — R079 Chest pain, unspecified: Secondary | ICD-10-CM | POA: Diagnosis not present

## 2013-05-30 LAB — TROPONIN I: Troponin I: <0.3 ng/mL (ref <0.30)

## 2013-05-30 MED ORDER — DIPHENHYDRAMINE HCL 25 MG PO CAPS
25.0000 mg | ORAL_CAPSULE | Freq: Once | ORAL | Status: AC
  Administered 2013-05-30: 25 mg via ORAL
  Filled 2013-05-30: qty 1

## 2013-05-30 MED ORDER — GI COCKTAIL ~~LOC~~
30.0000 mL | Freq: Two times a day (BID) | ORAL | Status: DC | PRN
  Administered 2013-05-30: 30 mL via ORAL
  Filled 2013-05-30 (×2): qty 30

## 2013-05-30 NOTE — Progress Notes (Signed)
  Echocardiogram 2D Echocardiogram has been performed.  Alexis Edwards 05/30/2013, 10:52 AM

## 2013-05-30 NOTE — Progress Notes (Signed)
Subjective:  She now has epigastric tenderness, no chest pain.  Objective:  Vital Signs in the last 24 hours: Temp:  [97.6 F (36.4 C)-98.3 F (36.8 C)] 97.8 F (36.6 C) (08/07 0742) Pulse Rate:  [47-74] 48 (08/07 0700) Resp:  [9-21] 14 (08/07 0700) BP: (68-120)/(13-79) 108/49 mmHg (08/07 0700) SpO2:  [87 %-99 %] 95 % (08/07 0700) Weight:  [288 lb 5.8 oz (130.8 kg)] 288 lb 5.8 oz (130.8 kg) (08/06 1930)  Intake/Output from previous day:  Intake/Output Summary (Last 24 hours) at 05/30/13 0814 Last data filed at 05/30/13 0600  Gross per 24 hour  Intake  95.61 ml  Output    300 ml  Net -204.39 ml    Physical Exam: General appearance: alert, cooperative, no distress and morbidly obese Thick neck Lungs: clear to auscultation bilaterally Heart: regular rate and rhythm and bradycardic No chest wall tenderness Abdomen: epigastric tenderness No edema   Rate: 50  Rhythm: sinus bradycardia  Lab Results:  Recent Labs  05/29/13 1426  WBC 14.0*  HGB 15.8*  PLT 238    Recent Labs  05/29/13 1426  NA 141  K 4.5  CL 102  CO2 30  GLUCOSE 78  BUN 19  CREATININE 1.06    Recent Labs  05/29/13 2230 05/30/13 0405  TROPONINI <0.30 <0.30   Hepatic Function Panel  Recent Labs  05/29/13 1426  PROT 6.3  ALBUMIN 3.4*  AST 21  ALT 15  ALKPHOS 73  BILITOT 0.6   No results found for this basename: CHOL,  in the last 72 hours No results found for this basename: INR,  in the last 72 hours  Imaging: Dg Chest Portable 1 View  05/29/2013   *RADIOLOGY REPORT*  Clinical Data: Chest pain  PORTABLE CHEST - 1 VIEW  Comparison: Prior chest x-ray 03/31/2012  Findings: Stable appearance of the chest with borderline cardiomegaly and diffuse prominence of the interstitial markings. No overt pulmonary edema.  Limited evaluation secondary to portable technique and patient body habitus.  IMPRESSION: No acute cardiopulmonary process, stable chest x-ray.   Original Report Authenticated  By: Malachy Moan, M.D.    Cardiac Studies:  Assessment/Plan:   Principal Problem:   Unstable angina Active Problems:   Takotsubo syndrome- documented by cath/ echo at [facility] April 2013   Situational stress   Morbid obesity with BMI of 50.0-59.9, adult   Sleep apnea- C-pap intol   HTN (hypertension)   Dyslipidemia    PLAN: Troponin negative X 2. Echo pending. If her echo is Nl we can transfer to telemetry, decrease Lovenox to DVT prophylaxis, stop Nitro. I'm not sure she will be able to tolerate beta blocker, I have cut the dose back but she is not getting it. HR 40's at night. Will try GI cocktail this am   Corine Shelter PA-C Beeper [phone] 05/30/2013, 8:14 AM  Patient seen and examined. Agree with assessment and plan. No recurrent pain. Suspect nonischemic chest pain, probably non cardiac.  Will check echo this am. Discussed importance of CPAP use with her OSA and potential to change to nasal pillows rather than full face mask.   Lennette Bihari, MD, [facility] 05/30/2013 8:50 AM

## 2013-05-30 NOTE — Care Management Note (Signed)
    Page 1 of 1   05/30/2013     9:53:36 AM   CARE MANAGEMENT NOTE 05/30/2013  Patient:  Alexis Edwards, Alexis Edwards   Account Number:  [id number]  Date Initiated:  05/30/2013  Documentation initiated by:  Junius Creamer  Subjective/Objective Assessment:   adm w angina     Action/Plan:   lives w husband, pcp [employer]   Anticipated DC Date:     Anticipated DC Plan:        DC Planning Services  CM consult      Choice offered to / List presented to:             Status of service:   Medicare Important Message given?   (If response is "NO", the following Medicare IM given date fields will be blank) Date Medicare IM given:   Date Additional Medicare IM given:    Discharge Disposition:    Per UR Regulation:  Reviewed for med. necessity/level of care/duration of stay  If discussed at Long Length of Stay Meetings, dates discussed:    Comments:

## 2013-05-30 NOTE — Progress Notes (Signed)
Preliminary echo report without significant WMA and NL LVF. Her HR has been low, will go ahead and stop her beta blocker. Stop IV NTG (this has been off for several hours secondary to low B/P0, and transfer to telemetry. Evaluate later today after official echo report.   Corine Shelter PA-C 05/30/2013 11:50 AM

## 2013-05-31 DIAGNOSIS — R079 Chest pain, unspecified: Secondary | ICD-10-CM | POA: Diagnosis not present

## 2013-05-31 DIAGNOSIS — I200 Unstable angina: Secondary | ICD-10-CM | POA: Diagnosis not present

## 2013-05-31 DIAGNOSIS — R1013 Epigastric pain: Secondary | ICD-10-CM | POA: Diagnosis not present

## 2013-05-31 DIAGNOSIS — I5181 Takotsubo syndrome: Secondary | ICD-10-CM | POA: Diagnosis not present

## 2013-05-31 DIAGNOSIS — E785 Hyperlipidemia, unspecified: Secondary | ICD-10-CM | POA: Diagnosis not present

## 2013-05-31 MED ORDER — GI COCKTAIL ~~LOC~~
30.0000 mL | Freq: Two times a day (BID) | ORAL | Status: DC
  Administered 2013-05-31 (×2): 30 mL via ORAL
  Filled 2013-05-31 (×3): qty 30

## 2013-05-31 MED ORDER — PANTOPRAZOLE SODIUM 40 MG PO TBEC: 40.0000 mg | DELAYED_RELEASE_TABLET | Freq: Every day | ORAL | Status: DC

## 2013-05-31 NOTE — Progress Notes (Signed)
Subjective:  She is still complaining of epigastric pain.   Objective:  Vital Signs in the last 24 hours: Temp:  [97.7 F (36.5 C)-98.4 F (36.9 C)] 97.7 F (36.5 C) (08/08 0349) Pulse Rate:  [48-60] 57 (08/08 0349) Resp:  [13-19] 18 (08/08 0349) BP: (86-113)/(53-59) 92/54 mmHg (08/08 0349) SpO2:  [94 %-99 %] 96 % (08/08 0349)  Intake/Output from previous day:  Intake/Output Summary (Last 24 hours) at 05/31/13 0839 Last data filed at 05/31/13 0801  Gross per 24 hour  Intake    870 ml  Output    950 ml  Net    -80 ml    Physical Exam: General appearance: alert, cooperative and morbidly obese Lungs: clear to auscultation bilaterally Heart: regular rate and rhythm   Rate: 57  Rhythm: sinus bradycardia  Lab Results:  Recent Labs  05/29/13 1426  WBC 14.0*  HGB 15.8*  PLT 238    Recent Labs  05/29/13 1426  NA 141  K 4.5  CL 102  CO2 30  GLUCOSE 78  BUN 19  CREATININE 1.06    Recent Labs  05/29/13 2230 05/30/13 0405  TROPONINI <0.30 <0.30   Hepatic Function Panel  Recent Labs  05/29/13 1426  PROT 6.3  ALBUMIN 3.4*  AST 21  ALT 15  ALKPHOS 73  BILITOT 0.6   No results found for this basename: CHOL,  in the last 72 hours No results found for this basename: INR,  in the last 72 hours  Imaging: Imaging results have been reviewed  Cardiac Studies: 2D - Left ventricle: The cavity size was normal. Wall thickness was normal. Systolic function was normal. The estimated ejection fraction was in the range of 55% to 65%. Wall motion was normal; there were no regional wall motion abnormalities. Doppler parameters are consistent with abnormal left ventricular relaxation (grade 1 diastolic dysfunction).   Assessment/Plan:   Principal Problem:   Unstable angina Active Problems:   Takotsubo syndrome- documented by cath/ echo at [facility] April 2013   Situational stress   Morbid obesity with BMI of 50.0-59.9, adult   Sleep apnea- C-pap intol  HTN (hypertension)   Dyslipidemia    PLAN: Her Troponin's are negative X 3, echo shows NL LVF with no WMA. Doubt this is a Takotsubo event. Consider CT of her abdomin, she has had a cholecystectomy.  Corine Shelter PA-C Beeper [phone] 05/31/2013, 8:39 AM   Agree with note written by Corine Shelter PAC  Non cardiac CP. Enz neg. 2D w/o WMA. Tender to palp in epigastrium. Will get GI consult.  Runell Gess 05/31/2013 10:23 AM

## 2013-05-31 NOTE — Progress Notes (Signed)
Discharged to home with family office visits in place teaching done  

## 2013-05-31 NOTE — Clinical Documentation Improvement (Signed)
THIS DOCUMENT IS NOT A PERMANENT PART OF THE MEDICAL RECORD  Please update your documentation with the medical record to reflect your response to this query. If you need help knowing how to do this please call [phone].           05/31/13  Dear Dr. Allyson Sabal Marton Redwood  In an effort to better capture your patient's severity of illness, reflect appropriate length of stay and utilization of resources, a review of the patient medical record has revealed the following indicators.    Based on your clinical judgment, please clarify and document in a progress note and/or discharge summary the clinical condition associated with the following supporting information:     Based on your clinical judgment, please clarify cause of Chest Pain:  _______Musculoskeletal Chest Pain _______Pleuritic Chest Pain _______GERD _______Costochondritis _______Cholelithiasis / Cholecystitis _______Dressler's syndrome _______Chest wall pain _______Other Condition _______Cannot Clinically Determine    Risk Factors: Principal problem: unstable angina; non cardiac chest pain, enzymes negative noted per 8/08 progress notes.  Epigastric pain, appears to be in a setting of a patient who has been taking quite a lot of Aleve recently per 8/08 GI consult.   You may use possible, probable, or suspect with inpatient documentation. possible, probable, suspected diagnoses MUST be documented at the time of discharge  Reviewed: No additional documentation provided.                                                      Thank You,  Marciano Sequin,  Clinical Documentation Specialist: [phone] Health Information Management [place]

## 2013-05-31 NOTE — Consult Note (Signed)
Subjective:   HPI  The patient is a 67 year old female who came in to the hospital with atypical chest pain. Cardiac causes have been ruled out. We are asked to see her in regards to epigastric pain which she states she has been having over the last 3 or 4 days. She describes this as a burning pain in the epigastric area. She tells me that she has been taking 2 Aleve twice a day for the past several weeks for arthritic pains. She thinks she had an ulcer when she was in her 30s. She denies vomiting, hematemesis, or melena.  Review of Systems No vomiting, hematemesis, or melena  Past Medical History  Diagnosis Date  . Obesity     BMI 55  . Takotsubo syndrome April 2013    apical ballooning, [facility]- [place]  . Depression   . HTN (hypertension)   . Dyslipidemia   . Sleep apnea     C-pap intol   Past Surgical History  Procedure Laterality Date  . Cholecystectomy  2/10  . Total knee arthroplasty  5/12    Lt  . Abdominal hysterectomy     History   Social History  . Marital Status: Married    Spouse Name: N/A    Number of Children: N/A  . Years of Education: N/A   Occupational History  . Not on file.   Social History Main Topics  . Smoking status: Never Smoker   . Smokeless tobacco: Not on file  . Alcohol Use: Yes  . Drug Use: Not on file  . Sexually Active: Not on file   Other Topics Concern  . Not on file   Social History Narrative  . No narrative on file   family history includes Coronary artery disease (age of onset: 73) in her brother and Diabetes in her mother. Current facility-administered medications:0.9 %  sodium chloride infusion, , Intravenous, Continuous, Runell Gess, MD;  acetaminophen (TYLENOL) tablet 650 mg, 650 mg, Oral, Q4H PRN, Eda Paschal Kilroy, PA-C;  ALPRAZolam Prudy Feeler) tablet 0.5 mg, 0.5 mg, Oral, TID PRN, Abelino Derrick, PA-C, 0.5 mg at 05/30/13 0302;  atorvastatin (LIPITOR) tablet 40 mg, 40 mg, Oral, q1800, Abelino Derrick, PA-C, 40 mg at 05/30/13  1753 calcium carbonate (TUMS - dosed in mg elemental calcium) chewable tablet 1,200 mg of elemental calcium, 6 tablet, Oral, Daily PRN, Abelino Derrick, PA-C;  citalopram (CELEXA) tablet 10 mg, 10 mg, Oral, Daily, Luke K Kilroy, PA-C, 10 mg at 05/31/13 1152;  furosemide (LASIX) tablet 20 mg, 20 mg, Oral, Daily, Luke K Kilroy, PA-C, 20 mg at 05/31/13 1151 gi cocktail (Maalox,Lidocaine,Donnatal), 30 mL, Oral, BID AC, [employer], PA-C, 30 mL at 05/31/13 0951;  HYDROmorphone (DILAUDID) injection 1 mg, 1 mg, Intravenous, Q4H PRN, Abelino Derrick, PA-C, 0.5 mg at 05/30/13 0208;  lisinopril (PRINIVIL,ZESTRIL) tablet 10 mg, 10 mg, Oral, Daily, Luke K Kilroy, PA-C, 10 mg at 05/31/13 1151;  multivitamin with minerals tablet 1 tablet, 1 tablet, Oral, Daily, Abelino Derrick, PA-C, 1 tablet at 05/31/13 1145 nitroGLYCERIN (NITROSTAT) SL tablet 0.4 mg, 0.4 mg, Sublingual, Q5 min PRN, Junius Argyle, MD, 0.4 mg at 05/29/13 1528;  omega-3 acid ethyl esters (LOVAZA) capsule 1 g, 1 g, Oral, BID, Luke K Kilroy, PA-C, 1 g at 05/31/13 1145;  pantoprazole (PROTONIX) EC tablet 40 mg, 40 mg, Oral, Q0600, Abelino Derrick, PA-C, 40 mg at 05/31/13 0602;  traMADol (ULTRAM) tablet 50 mg, 50 mg, Oral, Q6H PRN, Eda Paschal  Kilroy, PA-C, 50 mg at 05/30/13 0010 vitamin C (ASCORBIC ACID) tablet 250 mg, 250 mg, Oral, Daily, Eda Paschal Kilroy, PA-C, 250 mg at 05/31/13 1145;  zolpidem (AMBIEN) tablet 5 mg, 5 mg, Oral, QHS PRN, Abelino Derrick, PA-C Allergies  Allergen Reactions  . Macrobid (Nitrofurantoin) Itching and Rash  . Sulfa Antibiotics Itching and Rash     Objective:     BP 92/54  Pulse 57  Temp(Src) 97.7 F (36.5 C) (Oral)  Resp 18  Ht 4\' 11"  (1.499 m)  Wt 130.8 kg (288 lb 5.8 oz)  BMI 58.21 kg/m2  SpO2 96%  She is alert and oriented  No acute distress  Heart regular rhythm no murmurs  Lungs clear  Abdomen: Soft with mild tenderness in the epigastric area  Laboratory No components found with this basename: d1       Assessment:     Epigastric pain. This appears to be in a setting of a patient who has been taking quite a lot of Aleve recently.      Plan:     I agree with the use of pantoprazole as a PPI agent for this problem at this time. I discussed EGD with her but she prefers trying treatment first for this pain and seeing if that helps. We would be happy to follow her up in the office after she is discharged. She will need to avoid Aleve. I recommended to her to take the pantoprazole which will need to be prescribed to her upon discharge and avoid Aleve. I asked her to followup with [place] in the office as an outpatient. From my standpoint she can be discharged provided she is stable from a cardiac standpoint.

## 2013-06-03 NOTE — Discharge Summary (Signed)
Physician Discharge Summary  Patient ID: Alexis Edwards MRN: [id number] DOB/AGE: 25-Jan-1946 67 y.o.  Admit date: 05/29/2013 Discharge date: 06/03/2013  Admission Diagnoses:  Unstable Angina  Discharge Diagnoses:   Principal Problem:   Unstable angina Active Problems:   Takotsubo syndrome- documented by cath/ echo at [facility] April 2013   Situational stress   Morbid obesity with BMI of 50.0-59.9, adult   Sleep apnea- C-pap intol   HTN (hypertension)   Dyslipidemia   Discharged Condition: stable  Hospital Course:   The patient is a very pleasant 67 year old white married mother of three (two living children), two grandchildren, and three step grandchildren. 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 She was hospitalized at [facility] in [place] from April 11 to February 06 2012 with an ST elevation myocardial infarction. It appeared she had Takotsubo type cardiomyopathy with apical ballooning and normal coronary arteries. She has been treated medically since that time. An echo done May 2014 showed normal LVF, previous echo from April 2013 showed an EF of 45% with an apical WMA.   She is seen in the ER with complaints of SSCP for the last 48 hours. She tried taking Tums and Prilosec OTC without relief. She called Dr Cathlean Cower and was instructed to come to the ER. She describes a mid sternal "burning" and "tightness" with some radiation to her arm and jaw. NTG seemed to help in the ER. Her EKG shows poor ant RW which is not new. She admits to being under increased stress at home due some issues with her sister who is apperently mentally retarded. She was tearful during the exam. She was admitted to R/O MI.  Started on IV nitro. Lovenox.  PPI added.  She ruled out for MI.  2D echo revealed EF of 55-60% and grade one diastolic dysfunction.  GI recommended PPI and EGD was deferred.  She was  instructed to avoid Aleve.  The patient was seen by Dr.  Allyson Sabal who felt she was stable for DC home.  Consults: GI  Significant Diagnostic Studies:   2D echo Study Conclusions  - Left ventricle: The cavity size was normal. Wall thickness was normal. Systolic function was normal. The estimated ejection fraction was in the range of 55% to 65%. Wall motion was normal; there were no regional wall motion abnormalities. Doppler parameters are consistent with abnormal left ventricular relaxation (grade 1 diastolic dysfunction). - Aortic valve: Mildly calcified annulus. Trileaflet; mildly thickened, mildly calcified leaflets. Trivial regurgitation. - Mitral valve: Mildly calcified annulus. - Left atrium: The atrium was mildly dilated. - Atrial septum: No defect or patent foramen ovale was identified.  CBC    Component Value Date/Time   WBC 14.0* 05/29/2013 1426   RBC 4.55 05/29/2013 1426   HGB 15.8* 05/29/2013 1426   HCT 44.7 05/29/2013 1426   PLT 238 05/29/2013 1426   MCV 98.2 05/29/2013 1426   MCH 34.7* 05/29/2013 1426   MCHC 35.3 05/29/2013 1426   RDW 12.5 05/29/2013 1426   LYMPHSABS PENDING 02/14/2011 1415   LYMPHSABS 3.2 02/14/2011 1415   MONOABS PENDING 02/14/2011 1415   MONOABS 0.7 02/14/2011 1415   EOSABS PENDING 02/14/2011 1415   EOSABS 0.2 02/14/2011 1415   BASOSABS PENDING 02/14/2011 1415   BASOSABS 0.1 02/14/2011 1415   BMET    Component Value Date/Time   NA 141 05/29/2013 1426   K 4.5 05/29/2013 1426   CL 102 05/29/2013 1426  CO2 30 05/29/2013 1426   GLUCOSE 78 05/29/2013 1426   BUN 19 05/29/2013 1426   CREATININE 1.06 05/29/2013 1426   CALCIUM 10.0 05/29/2013 1426   GFRNONAA 53* 05/29/2013 1426   GFRAA 62* 05/29/2013 1426    Treatments: See above  Discharge Exam: Blood pressure 114/73, pulse 59, temperature 98.6 F (37 C), temperature source Oral, resp. rate 18, height 4\' 11"  (1.499 m), weight 288 lb 5.8 oz (130.8 kg), SpO2 96.00%.   Disposition: 01-Home or Self Care  Discharge Orders    Future Orders Complete By Expires     Diet - low sodium heart healthy  As directed     Discharge instructions  As directed     Comments:      Follow up with GI physician.    Increase activity slowly  As directed         Medication List    STOP taking these medications       metoprolol succinate 25 MG 24 hr tablet  Commonly known as:  TOPROL-XL     omeprazole 20 MG tablet  Commonly known as:  PRILOSEC OTC      TAKE these medications       calcium carbonate 500 MG chewable tablet  Commonly known as:  TUMS - dosed in mg elemental calcium  Chew 6 tablets by mouth daily as needed for heartburn.     CALCIUM PO  Take 1,000 mg by mouth.     citalopram 10 MG tablet  Commonly known as:  CELEXA  Take 10 mg by mouth daily.     furosemide 20 MG tablet  Commonly known as:  LASIX  Take 20 mg by mouth.     lisinopril 10 MG tablet  Commonly known as:  PRINIVIL,ZESTRIL  Take 10 mg by mouth daily.     multivitamin with minerals Tabs tablet  Take 1 tablet by mouth daily.     omega-3 acid ethyl esters 1 G capsule  Commonly known as:  LOVAZA  Take 1 g by mouth 2 (two) times daily.     pantoprazole 40 MG tablet  Commonly known as:  PROTONIX  Take 1 tablet (40 mg total) by mouth daily at 6 (six) AM.     simvastatin 80 MG tablet  Commonly known as:  ZOCOR  Take 80 mg by mouth at bedtime.     VITAMIN C PO  Take 1 tablet by mouth 2 (two) times daily.     VITAMIN D (ERGOCALCIFEROL) PO  Take 1 tablet by mouth every evening.        Greater than 30 minutes was spent completing the patient's discharge.    SignedWilburt Finlay 06/03/2013, 10:50 AM

## 2013-06-04 DIAGNOSIS — R262 Difficulty in walking, not elsewhere classified: Secondary | ICD-10-CM | POA: Diagnosis not present

## 2013-06-04 DIAGNOSIS — M545 Low back pain, unspecified: Secondary | ICD-10-CM | POA: Diagnosis not present

## 2013-06-04 DIAGNOSIS — M48061 Spinal stenosis, lumbar region without neurogenic claudication: Secondary | ICD-10-CM | POA: Diagnosis not present

## 2013-06-06 DIAGNOSIS — M48061 Spinal stenosis, lumbar region without neurogenic claudication: Secondary | ICD-10-CM | POA: Diagnosis not present

## 2013-06-06 DIAGNOSIS — M545 Low back pain, unspecified: Secondary | ICD-10-CM | POA: Diagnosis not present

## 2013-06-06 DIAGNOSIS — R262 Difficulty in walking, not elsewhere classified: Secondary | ICD-10-CM | POA: Diagnosis not present

## 2013-06-17 DIAGNOSIS — M545 Low back pain, unspecified: Secondary | ICD-10-CM | POA: Diagnosis not present

## 2013-06-21 ENCOUNTER — Other Ambulatory Visit: Payer: Self-pay | Admitting: Cardiovascular Disease

## 2013-06-21 DIAGNOSIS — K219 Gastro-esophageal reflux disease without esophagitis: Secondary | ICD-10-CM | POA: Diagnosis not present

## 2013-06-21 DIAGNOSIS — E782 Mixed hyperlipidemia: Secondary | ICD-10-CM | POA: Diagnosis not present

## 2013-06-21 DIAGNOSIS — R5381 Other malaise: Secondary | ICD-10-CM | POA: Diagnosis not present

## 2013-06-21 DIAGNOSIS — R6889 Other general symptoms and signs: Secondary | ICD-10-CM | POA: Diagnosis not present

## 2013-06-21 LAB — CBC WITH DIFFERENTIAL/PLATELET
Basophils Absolute: 0.1 10*3/uL (ref 0.0–0.1)
Basophils Relative: 1 % (ref 0–1)
Eosinophils Absolute: 0.2 10*3/uL (ref 0.0–0.7)
Eosinophils Relative: 2 % (ref 0–5)
HCT: 42.9 % (ref 36.0–46.0)
Hemoglobin: 14.8 g/dL (ref 12.0–15.0)
Lymphocytes Relative: 28 % (ref 12–46)
Lymphs Abs: 2.2 10*3/uL (ref 0.7–4.0)
MCH: 33.3 pg (ref 26.0–34.0)
MCHC: 34.5 g/dL (ref 30.0–36.0)
MCV: 96.6 fL (ref 78.0–100.0)
Monocytes Absolute: 0.6 10*3/uL (ref 0.1–1.0)
Monocytes Relative: 7 % (ref 3–12)
Neutro Abs: 4.8 10*3/uL (ref 1.7–7.7)
Neutrophils Relative %: 62 % (ref 43–77)
Platelets: 291 10*3/uL (ref 150–400)
RBC: 4.44 MIL/uL (ref 3.87–5.11)
RDW: 12.9 % (ref 11.5–15.5)
WBC: 7.8 10*3/uL (ref 4.0–10.5)

## 2013-06-21 LAB — COMPREHENSIVE METABOLIC PANEL
ALT: 15 U/L (ref 0–35)
AST: 20 U/L (ref 0–37)
Albumin: 4.2 g/dL (ref 3.5–5.2)
Alkaline Phosphatase: 64 U/L (ref 39–117)
BUN: 15 mg/dL (ref 6–23)
CO2: 27 mEq/L (ref 19–32)
Calcium: 9.4 mg/dL (ref 8.4–10.5)
Chloride: 105 mEq/L (ref 96–112)
Creat: 1.13 mg/dL — ABNORMAL HIGH (ref 0.50–1.10)
Glucose, Bld: 113 mg/dL — ABNORMAL HIGH (ref 70–99)
Potassium: 4.3 mEq/L (ref 3.5–5.3)
Sodium: 140 mEq/L (ref 135–145)
Total Bilirubin: 0.9 mg/dL (ref 0.3–1.2)
Total Protein: 6.5 g/dL (ref 6.0–8.3)

## 2013-06-21 LAB — T4, FREE: Free T4: 1.14 ng/dL (ref 0.80–1.80)

## 2013-06-21 LAB — TSH: TSH: 4.048 u[IU]/mL (ref 0.350–4.500)

## 2013-07-01 DIAGNOSIS — N390 Urinary tract infection, site not specified: Secondary | ICD-10-CM | POA: Diagnosis not present

## 2013-07-05 ENCOUNTER — Other Ambulatory Visit: Payer: Self-pay | Admitting: Gastroenterology

## 2013-07-05 DIAGNOSIS — R1013 Epigastric pain: Secondary | ICD-10-CM | POA: Diagnosis not present

## 2013-07-16 ENCOUNTER — Inpatient Hospital Stay: Admission: RE | Admit: 2013-07-16 | Payer: Medicare Other | Source: Ambulatory Visit

## 2013-07-18 ENCOUNTER — Encounter: Payer: Self-pay | Admitting: Cardiovascular Disease

## 2013-07-22 DIAGNOSIS — M545 Low back pain, unspecified: Secondary | ICD-10-CM | POA: Diagnosis not present

## 2013-07-22 DIAGNOSIS — M4310 Spondylolisthesis, site unspecified: Secondary | ICD-10-CM | POA: Diagnosis not present

## 2013-07-26 ENCOUNTER — Ambulatory Visit
Admission: RE | Admit: 2013-07-26 | Discharge: 2013-07-26 | Disposition: A | Discharge status: Home / Self Care | Payer: Medicare Other | Source: Ambulatory Visit | Attending: Gastroenterology | Admitting: Gastroenterology

## 2013-07-26 DIAGNOSIS — K2289 Other specified disease of esophagus: Secondary | ICD-10-CM | POA: Diagnosis not present

## 2013-07-26 DIAGNOSIS — R1013 Epigastric pain: Secondary | ICD-10-CM

## 2013-07-26 DIAGNOSIS — K228 Other specified diseases of esophagus: Secondary | ICD-10-CM | POA: Diagnosis not present

## 2013-07-26 DIAGNOSIS — K449 Diaphragmatic hernia without obstruction or gangrene: Secondary | ICD-10-CM | POA: Diagnosis not present

## 2013-08-06 DIAGNOSIS — M545 Low back pain, unspecified: Secondary | ICD-10-CM | POA: Diagnosis not present

## 2013-08-20 DIAGNOSIS — M545 Low back pain, unspecified: Secondary | ICD-10-CM | POA: Diagnosis not present

## 2013-08-29 DIAGNOSIS — Z79899 Other long term (current) drug therapy: Secondary | ICD-10-CM | POA: Diagnosis not present

## 2013-08-29 DIAGNOSIS — Z23 Encounter for immunization: Secondary | ICD-10-CM | POA: Diagnosis not present

## 2013-08-29 DIAGNOSIS — IMO0001 Reserved for inherently not codable concepts without codable children: Secondary | ICD-10-CM | POA: Diagnosis not present

## 2013-08-29 DIAGNOSIS — R5381 Other malaise: Secondary | ICD-10-CM | POA: Diagnosis not present

## 2013-08-29 DIAGNOSIS — M899 Disorder of bone, unspecified: Secondary | ICD-10-CM | POA: Diagnosis not present

## 2013-09-04 DIAGNOSIS — M545 Low back pain, unspecified: Secondary | ICD-10-CM | POA: Diagnosis not present

## 2013-09-06 ENCOUNTER — Other Ambulatory Visit: Payer: Self-pay | Admitting: Gastroenterology

## 2013-09-06 DIAGNOSIS — I10 Essential (primary) hypertension: Secondary | ICD-10-CM | POA: Diagnosis not present

## 2013-09-06 DIAGNOSIS — R5381 Other malaise: Secondary | ICD-10-CM | POA: Diagnosis not present

## 2013-09-06 DIAGNOSIS — R933 Abnormal findings on diagnostic imaging of other parts of digestive tract: Secondary | ICD-10-CM | POA: Diagnosis not present

## 2013-09-06 DIAGNOSIS — K2970 Gastritis, unspecified, without bleeding: Secondary | ICD-10-CM | POA: Diagnosis not present

## 2013-09-06 DIAGNOSIS — R609 Edema, unspecified: Secondary | ICD-10-CM | POA: Diagnosis not present

## 2013-09-06 DIAGNOSIS — E782 Mixed hyperlipidemia: Secondary | ICD-10-CM | POA: Diagnosis not present

## 2013-09-06 DIAGNOSIS — R7309 Other abnormal glucose: Secondary | ICD-10-CM | POA: Diagnosis not present

## 2013-09-11 ENCOUNTER — Other Ambulatory Visit: Payer: Self-pay | Admitting: *Deleted

## 2013-09-11 MED ORDER — SPIRONOLACTONE 25 MG PO TABS: 12.5000 mg | ORAL_TABLET | Freq: Every day | ORAL | Status: DC

## 2013-09-24 DIAGNOSIS — M545 Low back pain, unspecified: Secondary | ICD-10-CM | POA: Diagnosis not present

## 2013-10-07 DIAGNOSIS — F329 Major depressive disorder, single episode, unspecified: Secondary | ICD-10-CM | POA: Diagnosis not present

## 2013-10-07 DIAGNOSIS — IMO0001 Reserved for inherently not codable concepts without codable children: Secondary | ICD-10-CM | POA: Diagnosis not present

## 2013-10-07 DIAGNOSIS — F3289 Other specified depressive episodes: Secondary | ICD-10-CM | POA: Diagnosis not present

## 2013-10-22 ENCOUNTER — Encounter: Payer: Self-pay | Admitting: Cardiovascular Disease

## 2013-10-22 ENCOUNTER — Ambulatory Visit (INDEPENDENT_AMBULATORY_CARE_PROVIDER_SITE_OTHER): Payer: Medicare Other | Admitting: Cardiovascular Disease

## 2013-10-22 VITALS — BP 118/88 | HR 81 | Ht 59.0 in | Wt 283.3 lb

## 2013-10-22 DIAGNOSIS — I5181 Takotsubo syndrome: Secondary | ICD-10-CM

## 2013-10-22 DIAGNOSIS — I10 Essential (primary) hypertension: Secondary | ICD-10-CM

## 2013-10-22 DIAGNOSIS — R0602 Shortness of breath: Secondary | ICD-10-CM

## 2013-10-22 DIAGNOSIS — Z6841 Body Mass Index (BMI) 40.0 and over, adult: Secondary | ICD-10-CM

## 2013-10-22 DIAGNOSIS — G4730 Sleep apnea, unspecified: Secondary | ICD-10-CM | POA: Diagnosis not present

## 2013-10-22 DIAGNOSIS — E785 Hyperlipidemia, unspecified: Secondary | ICD-10-CM | POA: Diagnosis not present

## 2013-10-22 MED ORDER — SIMVASTATIN 40 MG PO TABS: 40.0000 mg | ORAL_TABLET | Freq: Every day | ORAL | Status: DC

## 2013-10-22 NOTE — Patient Instructions (Signed)
Decrease Simvastatin to 40mg  daily.  Your physician recommends that you schedule a follow-up appointment in: 6 months.

## 2013-11-14 DIAGNOSIS — M545 Low back pain, unspecified: Secondary | ICD-10-CM | POA: Diagnosis not present

## 2013-11-14 DIAGNOSIS — E669 Obesity, unspecified: Secondary | ICD-10-CM | POA: Diagnosis not present

## 2013-11-17 ENCOUNTER — Encounter: Payer: Self-pay | Admitting: Cardiovascular Disease
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
## 2013-11-21 ENCOUNTER — Other Ambulatory Visit: Payer: Self-pay | Admitting: Cardiovascular Disease

## 2014-02-06 DIAGNOSIS — M545 Low back pain, unspecified: Secondary | ICD-10-CM | POA: Diagnosis not present

## 2014-04-11 ENCOUNTER — Other Ambulatory Visit: Payer: Self-pay | Admitting: Family Medicine

## 2014-04-11 DIAGNOSIS — F3289 Other specified depressive episodes: Secondary | ICD-10-CM | POA: Diagnosis not present

## 2014-04-11 DIAGNOSIS — E782 Mixed hyperlipidemia: Secondary | ICD-10-CM | POA: Diagnosis not present

## 2014-04-11 DIAGNOSIS — I10 Essential (primary) hypertension: Secondary | ICD-10-CM | POA: Diagnosis not present

## 2014-04-11 DIAGNOSIS — R7309 Other abnormal glucose: Secondary | ICD-10-CM | POA: Diagnosis not present

## 2014-04-11 DIAGNOSIS — F329 Major depressive disorder, single episode, unspecified: Secondary | ICD-10-CM | POA: Diagnosis not present

## 2014-04-11 DIAGNOSIS — Z9181 History of falling: Secondary | ICD-10-CM | POA: Diagnosis not present

## 2014-04-11 DIAGNOSIS — R609 Edema, unspecified: Secondary | ICD-10-CM | POA: Diagnosis not present

## 2014-04-11 DIAGNOSIS — Z1231 Encounter for screening mammogram for malignant neoplasm of breast: Secondary | ICD-10-CM

## 2014-04-11 DIAGNOSIS — Z1331 Encounter for screening for depression: Secondary | ICD-10-CM | POA: Diagnosis not present

## 2014-04-11 DIAGNOSIS — Z6841 Body Mass Index (BMI) 40.0 and over, adult: Secondary | ICD-10-CM | POA: Diagnosis not present

## 2014-04-17 ENCOUNTER — Ambulatory Visit
Admission: RE | Admit: 2014-04-17 | Discharge: 2014-04-17 | Disposition: A | Discharge status: Home / Self Care | Payer: Medicare Other | Source: Ambulatory Visit | Attending: Family Medicine | Admitting: Family Medicine

## 2014-04-17 ENCOUNTER — Encounter (INDEPENDENT_AMBULATORY_CARE_PROVIDER_SITE_OTHER): Payer: Self-pay

## 2014-04-17 DIAGNOSIS — Z1231 Encounter for screening mammogram for malignant neoplasm of breast: Secondary | ICD-10-CM | POA: Diagnosis not present

## 2014-05-08 DIAGNOSIS — M545 Low back pain, unspecified: Secondary | ICD-10-CM | POA: Diagnosis not present

## 2014-05-08 DIAGNOSIS — Z79899 Other long term (current) drug therapy: Secondary | ICD-10-CM | POA: Diagnosis not present

## 2014-05-08 DIAGNOSIS — M5137 Other intervertebral disc degeneration, lumbosacral region: Secondary | ICD-10-CM | POA: Diagnosis not present

## 2014-05-08 DIAGNOSIS — E669 Obesity, unspecified: Secondary | ICD-10-CM | POA: Diagnosis not present

## 2014-06-12 DIAGNOSIS — H2510 Age-related nuclear cataract, unspecified eye: Secondary | ICD-10-CM | POA: Diagnosis not present

## 2014-06-12 DIAGNOSIS — H25019 Cortical age-related cataract, unspecified eye: Secondary | ICD-10-CM | POA: Diagnosis not present

## 2014-07-31 DIAGNOSIS — M545 Low back pain: Secondary | ICD-10-CM | POA: Diagnosis not present

## 2014-07-31 DIAGNOSIS — G894 Chronic pain syndrome: Secondary | ICD-10-CM | POA: Diagnosis not present

## 2014-07-31 DIAGNOSIS — M5136 Other intervertebral disc degeneration, lumbar region: Secondary | ICD-10-CM | POA: Diagnosis not present

## 2014-07-31 DIAGNOSIS — E669 Obesity, unspecified: Secondary | ICD-10-CM | POA: Diagnosis not present

## 2014-08-05 DIAGNOSIS — Z23 Encounter for immunization: Secondary | ICD-10-CM | POA: Diagnosis not present

## 2014-10-21 DIAGNOSIS — G894 Chronic pain syndrome: Secondary | ICD-10-CM | POA: Diagnosis not present

## 2014-10-21 DIAGNOSIS — Z79891 Long term (current) use of opiate analgesic: Secondary | ICD-10-CM | POA: Diagnosis not present

## 2014-10-21 DIAGNOSIS — M4310 Spondylolisthesis, site unspecified: Secondary | ICD-10-CM | POA: Diagnosis not present

## 2014-10-21 DIAGNOSIS — M5136 Other intervertebral disc degeneration, lumbar region: Secondary | ICD-10-CM | POA: Diagnosis not present

## 2014-10-31 DIAGNOSIS — E782 Mixed hyperlipidemia: Secondary | ICD-10-CM | POA: Diagnosis not present

## 2014-10-31 DIAGNOSIS — F329 Major depressive disorder, single episode, unspecified: Secondary | ICD-10-CM | POA: Diagnosis not present

## 2014-10-31 DIAGNOSIS — R609 Edema, unspecified: Secondary | ICD-10-CM | POA: Diagnosis not present

## 2014-10-31 DIAGNOSIS — R7309 Other abnormal glucose: Secondary | ICD-10-CM | POA: Diagnosis not present

## 2014-10-31 DIAGNOSIS — N3941 Urge incontinence: Secondary | ICD-10-CM | POA: Diagnosis not present

## 2015-01-13 DIAGNOSIS — G894 Chronic pain syndrome: Secondary | ICD-10-CM | POA: Diagnosis not present

## 2015-01-13 DIAGNOSIS — M5136 Other intervertebral disc degeneration, lumbar region: Secondary | ICD-10-CM | POA: Diagnosis not present

## 2015-01-13 DIAGNOSIS — Z79891 Long term (current) use of opiate analgesic: Secondary | ICD-10-CM | POA: Diagnosis not present

## 2015-01-13 DIAGNOSIS — M4310 Spondylolisthesis, site unspecified: Secondary | ICD-10-CM | POA: Diagnosis not present

## 2015-03-17 DIAGNOSIS — Z471 Aftercare following joint replacement surgery: Secondary | ICD-10-CM | POA: Diagnosis not present

## 2015-03-17 DIAGNOSIS — Z96652 Presence of left artificial knee joint: Secondary | ICD-10-CM | POA: Diagnosis not present

## 2015-03-17 DIAGNOSIS — M1711 Unilateral primary osteoarthritis, right knee: Secondary | ICD-10-CM | POA: Diagnosis not present

## 2015-03-24 DIAGNOSIS — R262 Difficulty in walking, not elsewhere classified: Secondary | ICD-10-CM | POA: Diagnosis not present

## 2015-03-24 DIAGNOSIS — M25562 Pain in left knee: Secondary | ICD-10-CM | POA: Diagnosis not present

## 2015-03-24 DIAGNOSIS — M25561 Pain in right knee: Secondary | ICD-10-CM | POA: Diagnosis not present

## 2015-03-24 DIAGNOSIS — M6281 Muscle weakness (generalized): Secondary | ICD-10-CM | POA: Diagnosis not present

## 2015-03-26 DIAGNOSIS — R262 Difficulty in walking, not elsewhere classified: Secondary | ICD-10-CM | POA: Diagnosis not present

## 2015-03-26 DIAGNOSIS — M25562 Pain in left knee: Secondary | ICD-10-CM | POA: Diagnosis not present

## 2015-03-26 DIAGNOSIS — M25561 Pain in right knee: Secondary | ICD-10-CM | POA: Diagnosis not present

## 2015-03-26 DIAGNOSIS — M6281 Muscle weakness (generalized): Secondary | ICD-10-CM | POA: Diagnosis not present

## 2015-03-27 DIAGNOSIS — M25561 Pain in right knee: Secondary | ICD-10-CM | POA: Diagnosis not present

## 2015-03-27 DIAGNOSIS — R262 Difficulty in walking, not elsewhere classified: Secondary | ICD-10-CM | POA: Diagnosis not present

## 2015-03-27 DIAGNOSIS — M6281 Muscle weakness (generalized): Secondary | ICD-10-CM | POA: Diagnosis not present

## 2015-03-27 DIAGNOSIS — M25562 Pain in left knee: Secondary | ICD-10-CM | POA: Diagnosis not present

## 2015-03-30 DIAGNOSIS — M25561 Pain in right knee: Secondary | ICD-10-CM | POA: Diagnosis not present

## 2015-03-30 DIAGNOSIS — M25562 Pain in left knee: Secondary | ICD-10-CM | POA: Diagnosis not present

## 2015-03-30 DIAGNOSIS — R262 Difficulty in walking, not elsewhere classified: Secondary | ICD-10-CM | POA: Diagnosis not present

## 2015-03-30 DIAGNOSIS — M6281 Muscle weakness (generalized): Secondary | ICD-10-CM | POA: Diagnosis not present

## 2015-04-01 DIAGNOSIS — M6281 Muscle weakness (generalized): Secondary | ICD-10-CM | POA: Diagnosis not present

## 2015-04-01 DIAGNOSIS — R262 Difficulty in walking, not elsewhere classified: Secondary | ICD-10-CM | POA: Diagnosis not present

## 2015-04-01 DIAGNOSIS — M25561 Pain in right knee: Secondary | ICD-10-CM | POA: Diagnosis not present

## 2015-04-01 DIAGNOSIS — M25562 Pain in left knee: Secondary | ICD-10-CM | POA: Diagnosis not present

## 2015-04-03 DIAGNOSIS — M25562 Pain in left knee: Secondary | ICD-10-CM | POA: Diagnosis not present

## 2015-04-03 DIAGNOSIS — M6281 Muscle weakness (generalized): Secondary | ICD-10-CM | POA: Diagnosis not present

## 2015-04-03 DIAGNOSIS — M25561 Pain in right knee: Secondary | ICD-10-CM | POA: Diagnosis not present

## 2015-04-03 DIAGNOSIS — R262 Difficulty in walking, not elsewhere classified: Secondary | ICD-10-CM | POA: Diagnosis not present

## 2015-04-06 DIAGNOSIS — M6281 Muscle weakness (generalized): Secondary | ICD-10-CM | POA: Diagnosis not present

## 2015-04-06 DIAGNOSIS — R262 Difficulty in walking, not elsewhere classified: Secondary | ICD-10-CM | POA: Diagnosis not present

## 2015-04-06 DIAGNOSIS — M25562 Pain in left knee: Secondary | ICD-10-CM | POA: Diagnosis not present

## 2015-04-06 DIAGNOSIS — M25561 Pain in right knee: Secondary | ICD-10-CM | POA: Diagnosis not present

## 2015-04-07 DIAGNOSIS — Z79891 Long term (current) use of opiate analgesic: Secondary | ICD-10-CM | POA: Diagnosis not present

## 2015-04-07 DIAGNOSIS — M4310 Spondylolisthesis, site unspecified: Secondary | ICD-10-CM | POA: Diagnosis not present

## 2015-04-07 DIAGNOSIS — M5136 Other intervertebral disc degeneration, lumbar region: Secondary | ICD-10-CM | POA: Diagnosis not present

## 2015-04-08 DIAGNOSIS — R262 Difficulty in walking, not elsewhere classified: Secondary | ICD-10-CM | POA: Diagnosis not present

## 2015-04-08 DIAGNOSIS — M25562 Pain in left knee: Secondary | ICD-10-CM | POA: Diagnosis not present

## 2015-04-08 DIAGNOSIS — M6281 Muscle weakness (generalized): Secondary | ICD-10-CM | POA: Diagnosis not present

## 2015-04-08 DIAGNOSIS — M25561 Pain in right knee: Secondary | ICD-10-CM | POA: Diagnosis not present

## 2015-04-10 DIAGNOSIS — M6281 Muscle weakness (generalized): Secondary | ICD-10-CM | POA: Diagnosis not present

## 2015-04-10 DIAGNOSIS — R262 Difficulty in walking, not elsewhere classified: Secondary | ICD-10-CM | POA: Diagnosis not present

## 2015-04-10 DIAGNOSIS — M25562 Pain in left knee: Secondary | ICD-10-CM | POA: Diagnosis not present

## 2015-04-10 DIAGNOSIS — M25561 Pain in right knee: Secondary | ICD-10-CM | POA: Diagnosis not present

## 2015-04-17 DIAGNOSIS — M25561 Pain in right knee: Secondary | ICD-10-CM | POA: Diagnosis not present

## 2015-04-17 DIAGNOSIS — M6281 Muscle weakness (generalized): Secondary | ICD-10-CM | POA: Diagnosis not present

## 2015-04-17 DIAGNOSIS — R262 Difficulty in walking, not elsewhere classified: Secondary | ICD-10-CM | POA: Diagnosis not present

## 2015-04-17 DIAGNOSIS — M25562 Pain in left knee: Secondary | ICD-10-CM | POA: Diagnosis not present

## 2015-04-20 DIAGNOSIS — R262 Difficulty in walking, not elsewhere classified: Secondary | ICD-10-CM | POA: Diagnosis not present

## 2015-04-20 DIAGNOSIS — M25562 Pain in left knee: Secondary | ICD-10-CM | POA: Diagnosis not present

## 2015-04-20 DIAGNOSIS — M6281 Muscle weakness (generalized): Secondary | ICD-10-CM | POA: Diagnosis not present

## 2015-04-20 DIAGNOSIS — M25561 Pain in right knee: Secondary | ICD-10-CM | POA: Diagnosis not present

## 2015-04-23 DIAGNOSIS — M25561 Pain in right knee: Secondary | ICD-10-CM | POA: Diagnosis not present

## 2015-04-23 DIAGNOSIS — R262 Difficulty in walking, not elsewhere classified: Secondary | ICD-10-CM | POA: Diagnosis not present

## 2015-04-23 DIAGNOSIS — M6281 Muscle weakness (generalized): Secondary | ICD-10-CM | POA: Diagnosis not present

## 2015-04-23 DIAGNOSIS — M25562 Pain in left knee: Secondary | ICD-10-CM | POA: Diagnosis not present

## 2015-04-24 DIAGNOSIS — R262 Difficulty in walking, not elsewhere classified: Secondary | ICD-10-CM | POA: Diagnosis not present

## 2015-04-24 DIAGNOSIS — M6281 Muscle weakness (generalized): Secondary | ICD-10-CM | POA: Diagnosis not present

## 2015-04-24 DIAGNOSIS — M25561 Pain in right knee: Secondary | ICD-10-CM | POA: Diagnosis not present

## 2015-04-24 DIAGNOSIS — M25562 Pain in left knee: Secondary | ICD-10-CM | POA: Diagnosis not present

## 2015-05-05 DIAGNOSIS — R262 Difficulty in walking, not elsewhere classified: Secondary | ICD-10-CM | POA: Diagnosis not present

## 2015-05-05 DIAGNOSIS — M25561 Pain in right knee: Secondary | ICD-10-CM | POA: Diagnosis not present

## 2015-05-05 DIAGNOSIS — M6281 Muscle weakness (generalized): Secondary | ICD-10-CM | POA: Diagnosis not present

## 2015-05-05 DIAGNOSIS — M25562 Pain in left knee: Secondary | ICD-10-CM | POA: Diagnosis not present

## 2015-05-06 DIAGNOSIS — R7309 Other abnormal glucose: Secondary | ICD-10-CM | POA: Diagnosis not present

## 2015-05-06 DIAGNOSIS — E782 Mixed hyperlipidemia: Secondary | ICD-10-CM | POA: Diagnosis not present

## 2015-05-08 ENCOUNTER — Other Ambulatory Visit: Payer: Self-pay | Admitting: Family Medicine

## 2015-05-08 DIAGNOSIS — M8580 Other specified disorders of bone density and structure, unspecified site: Secondary | ICD-10-CM | POA: Diagnosis not present

## 2015-05-08 DIAGNOSIS — Z6841 Body Mass Index (BMI) 40.0 and over, adult: Secondary | ICD-10-CM | POA: Diagnosis not present

## 2015-05-08 DIAGNOSIS — I10 Essential (primary) hypertension: Secondary | ICD-10-CM | POA: Diagnosis not present

## 2015-05-08 DIAGNOSIS — E782 Mixed hyperlipidemia: Secondary | ICD-10-CM | POA: Diagnosis not present

## 2015-05-08 DIAGNOSIS — Z1231 Encounter for screening mammogram for malignant neoplasm of breast: Secondary | ICD-10-CM

## 2015-05-08 DIAGNOSIS — F329 Major depressive disorder, single episode, unspecified: Secondary | ICD-10-CM | POA: Diagnosis not present

## 2015-05-08 DIAGNOSIS — Z9181 History of falling: Secondary | ICD-10-CM | POA: Diagnosis not present

## 2015-05-08 DIAGNOSIS — R609 Edema, unspecified: Secondary | ICD-10-CM | POA: Diagnosis not present

## 2015-05-08 DIAGNOSIS — R7309 Other abnormal glucose: Secondary | ICD-10-CM | POA: Diagnosis not present

## 2015-05-18 DIAGNOSIS — M6281 Muscle weakness (generalized): Secondary | ICD-10-CM | POA: Diagnosis not present

## 2015-05-18 DIAGNOSIS — M25561 Pain in right knee: Secondary | ICD-10-CM | POA: Diagnosis not present

## 2015-05-18 DIAGNOSIS — R262 Difficulty in walking, not elsewhere classified: Secondary | ICD-10-CM | POA: Diagnosis not present

## 2015-05-18 DIAGNOSIS — M25562 Pain in left knee: Secondary | ICD-10-CM | POA: Diagnosis not present

## 2015-05-19 ENCOUNTER — Encounter: Payer: Self-pay | Admitting: Cardiovascular Disease

## 2015-05-21 DIAGNOSIS — R262 Difficulty in walking, not elsewhere classified: Secondary | ICD-10-CM | POA: Diagnosis not present

## 2015-05-21 DIAGNOSIS — M25561 Pain in right knee: Secondary | ICD-10-CM | POA: Diagnosis not present

## 2015-05-21 DIAGNOSIS — M25562 Pain in left knee: Secondary | ICD-10-CM | POA: Diagnosis not present

## 2015-05-21 DIAGNOSIS — M6281 Muscle weakness (generalized): Secondary | ICD-10-CM | POA: Diagnosis not present

## 2015-05-26 ENCOUNTER — Ambulatory Visit
Admission: RE | Admit: 2015-05-26 | Discharge: 2015-05-26 | Disposition: A | Discharge status: Home / Self Care | Payer: Medicare Other | Source: Ambulatory Visit | Attending: Family Medicine | Admitting: Family Medicine

## 2015-05-26 DIAGNOSIS — M8580 Other specified disorders of bone density and structure, unspecified site: Secondary | ICD-10-CM

## 2015-05-26 DIAGNOSIS — Z780 Asymptomatic menopausal state: Secondary | ICD-10-CM | POA: Diagnosis not present

## 2015-05-26 DIAGNOSIS — Z1231 Encounter for screening mammogram for malignant neoplasm of breast: Secondary | ICD-10-CM | POA: Diagnosis not present

## 2015-05-26 DIAGNOSIS — M85852 Other specified disorders of bone density and structure, left thigh: Secondary | ICD-10-CM | POA: Diagnosis not present

## 2015-05-28 DIAGNOSIS — M25562 Pain in left knee: Secondary | ICD-10-CM | POA: Diagnosis not present

## 2015-05-28 DIAGNOSIS — R262 Difficulty in walking, not elsewhere classified: Secondary | ICD-10-CM | POA: Diagnosis not present

## 2015-05-28 DIAGNOSIS — M25561 Pain in right knee: Secondary | ICD-10-CM | POA: Diagnosis not present

## 2015-05-28 DIAGNOSIS — M6281 Muscle weakness (generalized): Secondary | ICD-10-CM | POA: Diagnosis not present

## 2015-06-05 DIAGNOSIS — M25562 Pain in left knee: Secondary | ICD-10-CM | POA: Diagnosis not present

## 2015-06-05 DIAGNOSIS — M6281 Muscle weakness (generalized): Secondary | ICD-10-CM | POA: Diagnosis not present

## 2015-06-05 DIAGNOSIS — M25561 Pain in right knee: Secondary | ICD-10-CM | POA: Diagnosis not present

## 2015-06-05 DIAGNOSIS — R262 Difficulty in walking, not elsewhere classified: Secondary | ICD-10-CM | POA: Diagnosis not present

## 2015-06-10 DIAGNOSIS — M25561 Pain in right knee: Secondary | ICD-10-CM | POA: Diagnosis not present

## 2015-07-01 DIAGNOSIS — M25561 Pain in right knee: Secondary | ICD-10-CM | POA: Diagnosis not present

## 2015-07-01 DIAGNOSIS — M25562 Pain in left knee: Secondary | ICD-10-CM | POA: Diagnosis not present

## 2015-07-01 DIAGNOSIS — M6281 Muscle weakness (generalized): Secondary | ICD-10-CM | POA: Diagnosis not present

## 2015-07-01 DIAGNOSIS — R262 Difficulty in walking, not elsewhere classified: Secondary | ICD-10-CM | POA: Diagnosis not present

## 2015-08-14 DIAGNOSIS — Z23 Encounter for immunization: Secondary | ICD-10-CM | POA: Diagnosis not present

## 2015-09-18 DIAGNOSIS — M791 Myalgia: Secondary | ICD-10-CM | POA: Diagnosis not present

## 2015-09-18 DIAGNOSIS — Z79899 Other long term (current) drug therapy: Secondary | ICD-10-CM | POA: Diagnosis not present

## 2015-09-18 DIAGNOSIS — R5383 Other fatigue: Secondary | ICD-10-CM | POA: Diagnosis not present

## 2015-09-18 DIAGNOSIS — Z6841 Body Mass Index (BMI) 40.0 and over, adult: Secondary | ICD-10-CM | POA: Diagnosis not present

## 2015-10-05 DIAGNOSIS — R7989 Other specified abnormal findings of blood chemistry: Secondary | ICD-10-CM | POA: Diagnosis not present

## 2015-10-05 DIAGNOSIS — R946 Abnormal results of thyroid function studies: Secondary | ICD-10-CM | POA: Diagnosis not present

## 2015-11-04 DIAGNOSIS — R609 Edema, unspecified: Secondary | ICD-10-CM | POA: Diagnosis not present

## 2015-11-04 DIAGNOSIS — R0609 Other forms of dyspnea: Secondary | ICD-10-CM | POA: Diagnosis not present

## 2015-11-04 DIAGNOSIS — M25512 Pain in left shoulder: Secondary | ICD-10-CM | POA: Diagnosis not present

## 2015-11-04 DIAGNOSIS — G4700 Insomnia, unspecified: Secondary | ICD-10-CM | POA: Diagnosis not present

## 2015-11-04 DIAGNOSIS — R7303 Prediabetes: Secondary | ICD-10-CM | POA: Diagnosis not present

## 2015-11-04 DIAGNOSIS — F329 Major depressive disorder, single episode, unspecified: Secondary | ICD-10-CM | POA: Diagnosis not present

## 2015-11-04 DIAGNOSIS — Z6841 Body Mass Index (BMI) 40.0 and over, adult: Secondary | ICD-10-CM | POA: Diagnosis not present

## 2015-11-04 DIAGNOSIS — E782 Mixed hyperlipidemia: Secondary | ICD-10-CM | POA: Diagnosis not present

## 2015-11-09 ENCOUNTER — Encounter: Payer: Self-pay | Admitting: Cardiology

## 2015-11-09 ENCOUNTER — Ambulatory Visit (INDEPENDENT_AMBULATORY_CARE_PROVIDER_SITE_OTHER): Payer: Medicare Other | Admitting: Cardiology

## 2015-11-09 VITALS — BP 142/92 | HR 58 | Ht 59.0 in | Wt 287.8 lb

## 2015-11-09 DIAGNOSIS — E785 Hyperlipidemia, unspecified: Secondary | ICD-10-CM | POA: Diagnosis not present

## 2015-11-09 DIAGNOSIS — Z6841 Body Mass Index (BMI) 40.0 and over, adult: Secondary | ICD-10-CM

## 2015-11-09 DIAGNOSIS — I5181 Takotsubo syndrome: Secondary | ICD-10-CM

## 2015-11-09 DIAGNOSIS — I10 Essential (primary) hypertension: Secondary | ICD-10-CM | POA: Diagnosis not present

## 2015-11-09 NOTE — Assessment & Plan Note (Signed)
BMI 58. She has looked into bariatric procedures "not for me"

## 2015-11-09 NOTE — Assessment & Plan Note (Signed)
No symptoms to suggest any further problems

## 2015-11-09 NOTE — Assessment & Plan Note (Signed)
Taken off statin Rx by her PCP- I believe secondary to joint pain
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
## 2015-11-09 NOTE — Assessment & Plan Note (Signed)
Followed by PCP- on low dose diuretic daily, otherwise no medications

## 2015-11-09 NOTE — Assessment & Plan Note (Signed)
Claustrophobic.

## 2015-11-09 NOTE — Patient Instructions (Signed)
Your physician recommends that you schedule a follow-up appointment in: one year with Dr. Claiborne Billings

## 2015-11-12 DIAGNOSIS — M25512 Pain in left shoulder: Secondary | ICD-10-CM | POA: Diagnosis not present

## 2015-11-13 DIAGNOSIS — M19011 Primary osteoarthritis, right shoulder: Secondary | ICD-10-CM | POA: Diagnosis not present

## 2015-11-13 DIAGNOSIS — Z6841 Body Mass Index (BMI) 40.0 and over, adult: Secondary | ICD-10-CM | POA: Diagnosis not present

## 2015-11-13 DIAGNOSIS — M1711 Unilateral primary osteoarthritis, right knee: Secondary | ICD-10-CM | POA: Diagnosis not present

## 2015-11-13 DIAGNOSIS — K219 Gastro-esophageal reflux disease without esophagitis: Secondary | ICD-10-CM | POA: Diagnosis not present

## 2015-11-20 DIAGNOSIS — M25512 Pain in left shoulder: Secondary | ICD-10-CM | POA: Diagnosis not present

## 2015-11-26 DIAGNOSIS — M25512 Pain in left shoulder: Secondary | ICD-10-CM | POA: Diagnosis not present

## 2015-12-03 DIAGNOSIS — M19012 Primary osteoarthritis, left shoulder: Secondary | ICD-10-CM | POA: Diagnosis not present

## 2016-01-05 DIAGNOSIS — N390 Urinary tract infection, site not specified: Secondary | ICD-10-CM | POA: Diagnosis not present

## 2016-02-25 DIAGNOSIS — M25512 Pain in left shoulder: Secondary | ICD-10-CM | POA: Diagnosis not present

## 2016-03-07 DIAGNOSIS — M25512 Pain in left shoulder: Secondary | ICD-10-CM | POA: Diagnosis not present

## 2016-03-07 DIAGNOSIS — G894 Chronic pain syndrome: Secondary | ICD-10-CM | POA: Diagnosis not present

## 2016-03-07 DIAGNOSIS — M4310 Spondylolisthesis, site unspecified: Secondary | ICD-10-CM | POA: Diagnosis not present

## 2016-03-07 DIAGNOSIS — M5136 Other intervertebral disc degeneration, lumbar region: Secondary | ICD-10-CM | POA: Diagnosis not present

## 2016-04-08 DIAGNOSIS — H2513 Age-related nuclear cataract, bilateral: Secondary | ICD-10-CM | POA: Diagnosis not present

## 2016-04-08 DIAGNOSIS — H25013 Cortical age-related cataract, bilateral: Secondary | ICD-10-CM | POA: Diagnosis not present

## 2016-05-04 ENCOUNTER — Other Ambulatory Visit: Payer: Self-pay | Admitting: Family Medicine

## 2016-05-04 DIAGNOSIS — F329 Major depressive disorder, single episode, unspecified: Secondary | ICD-10-CM | POA: Diagnosis not present

## 2016-05-04 DIAGNOSIS — R7303 Prediabetes: Secondary | ICD-10-CM | POA: Diagnosis not present

## 2016-05-04 DIAGNOSIS — Z1231 Encounter for screening mammogram for malignant neoplasm of breast: Secondary | ICD-10-CM

## 2016-05-04 DIAGNOSIS — E039 Hypothyroidism, unspecified: Secondary | ICD-10-CM | POA: Diagnosis not present

## 2016-05-04 DIAGNOSIS — Z1389 Encounter for screening for other disorder: Secondary | ICD-10-CM | POA: Diagnosis not present

## 2016-05-04 DIAGNOSIS — E782 Mixed hyperlipidemia: Secondary | ICD-10-CM | POA: Diagnosis not present

## 2016-05-04 DIAGNOSIS — R609 Edema, unspecified: Secondary | ICD-10-CM | POA: Diagnosis not present

## 2016-05-04 DIAGNOSIS — I509 Heart failure, unspecified: Secondary | ICD-10-CM | POA: Diagnosis not present

## 2016-05-04 DIAGNOSIS — Z6841 Body Mass Index (BMI) 40.0 and over, adult: Secondary | ICD-10-CM | POA: Diagnosis not present

## 2016-05-04 DIAGNOSIS — M79622 Pain in left upper arm: Secondary | ICD-10-CM | POA: Diagnosis not present

## 2016-05-26 ENCOUNTER — Ambulatory Visit
Admission: RE | Admit: 2016-05-26 | Discharge: 2016-05-26 | Disposition: A | Discharge status: Home / Self Care | Payer: Medicare Other | Source: Ambulatory Visit | Attending: Family Medicine | Admitting: Family Medicine

## 2016-05-26 DIAGNOSIS — Z1231 Encounter for screening mammogram for malignant neoplasm of breast: Secondary | ICD-10-CM | POA: Diagnosis not present

## 2016-06-30 DIAGNOSIS — M5136 Other intervertebral disc degeneration, lumbar region: Secondary | ICD-10-CM | POA: Diagnosis not present

## 2016-06-30 DIAGNOSIS — Z79891 Long term (current) use of opiate analgesic: Secondary | ICD-10-CM | POA: Diagnosis not present

## 2016-06-30 DIAGNOSIS — M25512 Pain in left shoulder: Secondary | ICD-10-CM | POA: Diagnosis not present

## 2016-06-30 DIAGNOSIS — G894 Chronic pain syndrome: Secondary | ICD-10-CM | POA: Diagnosis not present

## 2016-07-27 DIAGNOSIS — Z23 Encounter for immunization: Secondary | ICD-10-CM | POA: Diagnosis not present

## 2016-07-28 DIAGNOSIS — M19012 Primary osteoarthritis, left shoulder: Secondary | ICD-10-CM | POA: Diagnosis not present

## 2016-09-12 DIAGNOSIS — H1033 Unspecified acute conjunctivitis, bilateral: Secondary | ICD-10-CM | POA: Diagnosis not present

## 2016-10-27 DIAGNOSIS — Z79891 Long term (current) use of opiate analgesic: Secondary | ICD-10-CM | POA: Diagnosis not present

## 2016-10-27 DIAGNOSIS — G894 Chronic pain syndrome: Secondary | ICD-10-CM | POA: Diagnosis not present

## 2016-10-27 DIAGNOSIS — G8929 Other chronic pain: Secondary | ICD-10-CM | POA: Diagnosis not present

## 2016-10-27 DIAGNOSIS — M25512 Pain in left shoulder: Secondary | ICD-10-CM | POA: Diagnosis not present

## 2016-11-03 DIAGNOSIS — M19012 Primary osteoarthritis, left shoulder: Secondary | ICD-10-CM | POA: Diagnosis not present

## 2016-11-03 DIAGNOSIS — M75102 Unspecified rotator cuff tear or rupture of left shoulder, not specified as traumatic: Secondary | ICD-10-CM | POA: Diagnosis not present

## 2016-11-11 DIAGNOSIS — F329 Major depressive disorder, single episode, unspecified: Secondary | ICD-10-CM | POA: Diagnosis not present

## 2016-11-11 DIAGNOSIS — Z6841 Body Mass Index (BMI) 40.0 and over, adult: Secondary | ICD-10-CM | POA: Diagnosis not present

## 2016-11-11 DIAGNOSIS — N3941 Urge incontinence: Secondary | ICD-10-CM | POA: Diagnosis not present

## 2016-11-11 DIAGNOSIS — Z01818 Encounter for other preprocedural examination: Secondary | ICD-10-CM | POA: Diagnosis not present

## 2016-11-11 DIAGNOSIS — E782 Mixed hyperlipidemia: Secondary | ICD-10-CM | POA: Diagnosis not present

## 2016-11-11 DIAGNOSIS — K219 Gastro-esophageal reflux disease without esophagitis: Secondary | ICD-10-CM | POA: Diagnosis not present

## 2016-11-11 DIAGNOSIS — E039 Hypothyroidism, unspecified: Secondary | ICD-10-CM | POA: Diagnosis not present

## 2016-11-11 DIAGNOSIS — I509 Heart failure, unspecified: Secondary | ICD-10-CM | POA: Diagnosis not present

## 2016-11-14 DIAGNOSIS — Z01818 Encounter for other preprocedural examination: Secondary | ICD-10-CM | POA: Diagnosis not present

## 2016-11-14 DIAGNOSIS — E782 Mixed hyperlipidemia: Secondary | ICD-10-CM | POA: Diagnosis not present

## 2016-11-15 ENCOUNTER — Ambulatory Visit: Payer: Medicare Other | Admitting: Cardiovascular Disease

## 2016-11-16 ENCOUNTER — Ambulatory Visit (INDEPENDENT_AMBULATORY_CARE_PROVIDER_SITE_OTHER): Payer: Medicare Other | Admitting: Cardiovascular Disease

## 2016-11-16 ENCOUNTER — Encounter: Payer: Self-pay | Admitting: Cardiovascular Disease

## 2016-11-16 VITALS — BP 132/86 | HR 71 | Ht 59.0 in | Wt 277.6 lb

## 2016-11-16 DIAGNOSIS — I10 Essential (primary) hypertension: Secondary | ICD-10-CM

## 2016-11-16 DIAGNOSIS — I5181 Takotsubo syndrome: Secondary | ICD-10-CM

## 2016-11-16 DIAGNOSIS — E785 Hyperlipidemia, unspecified: Secondary | ICD-10-CM | POA: Diagnosis not present

## 2016-11-16 DIAGNOSIS — G4719 Other hypersomnia: Secondary | ICD-10-CM

## 2016-11-16 DIAGNOSIS — R0683 Snoring: Secondary | ICD-10-CM | POA: Diagnosis not present

## 2016-11-16 DIAGNOSIS — G4730 Sleep apnea, unspecified: Secondary | ICD-10-CM

## 2016-11-16 DIAGNOSIS — R0602 Shortness of breath: Secondary | ICD-10-CM

## 2016-11-16 NOTE — Patient Instructions (Signed)
Medication Instructions:  Your physician recommends that you continue on your current medications as directed. Please refer to the Current Medication list given to you today.  Labwork: Your physician recommends that you return for lab work in: TODAY FASTING-CMET, CBC, TSH, LIPID  Testing/Procedures: Your physician has requested that you have an echocardiogram. Echocardiography is a painless test that uses sound waves to create images of your heart. It provides your doctor with information about the size and shape of your heart and how well your heart's chambers and valves are working. This procedure takes approximately one hour. There are no restrictions for this procedure.  Your physician has recommended that you have a sleep study. This test records several body functions during sleep, including: brain activity, eye movement, oxygen and carbon dioxide blood levels, heart rate and rhythm, breathing rate and rhythm, the flow of air through your mouth and nose, snoring, body muscle movements, and chest and belly movement.  Follow-Up: Your physician recommends that you schedule a follow-up appointment in: [place].  Any Other Special Instructions Will Be Listed Below (If Applicable).  If you need a refill on your cardiac medications before your next appointment, please call your pharmacy.

## 2016-11-17 ENCOUNTER — Encounter: Payer: Self-pay | Admitting: Cardiovascular Disease

## 2016-11-17 DIAGNOSIS — G4719 Other hypersomnia: Secondary | ICD-10-CM | POA: Insufficient documentation

## 2016-11-17 DIAGNOSIS — R0683 Snoring: Secondary | ICD-10-CM | POA: Insufficient documentation
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
## 2016-11-30 ENCOUNTER — Other Ambulatory Visit (HOSPITAL_COMMUNITY): Payer: Medicare Other

## 2016-11-30 DIAGNOSIS — J189 Pneumonia, unspecified organism: Secondary | ICD-10-CM | POA: Diagnosis not present

## 2016-12-21 DIAGNOSIS — M19012 Primary osteoarthritis, left shoulder: Secondary | ICD-10-CM | POA: Diagnosis not present

## 2016-12-21 DIAGNOSIS — G8929 Other chronic pain: Secondary | ICD-10-CM | POA: Diagnosis not present

## 2016-12-21 DIAGNOSIS — M25512 Pain in left shoulder: Secondary | ICD-10-CM | POA: Diagnosis not present

## 2016-12-21 DIAGNOSIS — M25312 Other instability, left shoulder: Secondary | ICD-10-CM | POA: Diagnosis not present

## 2016-12-22 ENCOUNTER — Other Ambulatory Visit: Payer: Self-pay | Admitting: Orthopedic Surgery

## 2016-12-22 DIAGNOSIS — G8929 Other chronic pain: Secondary | ICD-10-CM

## 2016-12-22 DIAGNOSIS — M25512 Pain in left shoulder: Principal | ICD-10-CM

## 2016-12-23 ENCOUNTER — Ambulatory Visit
Admission: RE | Admit: 2016-12-23 | Discharge: 2016-12-23 | Disposition: A | Discharge status: Home / Self Care | Payer: Medicare Other | Source: Ambulatory Visit | Attending: Orthopedic Surgery | Admitting: Orthopedic Surgery

## 2016-12-23 DIAGNOSIS — M25512 Pain in left shoulder: Principal | ICD-10-CM

## 2016-12-23 DIAGNOSIS — G8929 Other chronic pain: Secondary | ICD-10-CM

## 2016-12-23 DIAGNOSIS — M19012 Primary osteoarthritis, left shoulder: Secondary | ICD-10-CM | POA: Diagnosis not present

## 2016-12-26 ENCOUNTER — Other Ambulatory Visit: Payer: Self-pay

## 2016-12-26 ENCOUNTER — Ambulatory Visit (HOSPITAL_COMMUNITY): Payer: Medicare Other | Attending: Cardiovascular Disease

## 2016-12-26 DIAGNOSIS — I5181 Takotsubo syndrome: Secondary | ICD-10-CM

## 2016-12-26 DIAGNOSIS — I361 Nonrheumatic tricuspid (valve) insufficiency: Secondary | ICD-10-CM | POA: Diagnosis not present

## 2016-12-26 DIAGNOSIS — I501 Left ventricular failure: Secondary | ICD-10-CM | POA: Diagnosis not present

## 2016-12-26 DIAGNOSIS — R0602 Shortness of breath: Secondary | ICD-10-CM

## 2016-12-26 LAB — ECHOCARDIOGRAM COMPLETE
E decel time: 331 msec
E/e' ratio: 7.91
FS: 49 % — AB (ref 28–44)
IVS/LV PW RATIO, ED: 0.95
LA ID, A-P, ES: 33 mm
LA diam end sys: 33 mm
LA diam index: 1.56 cm/m2
LA vol A4C: 40.5 ml
LA vol index: 21.9 mL/m2
LA vol: 46.5 mL
LV E/e' medial: 7.91
LV E/e'average: 7.91
LV PW d: 11.6 mm — AB (ref 0.6–1.1)
LV e' LATERAL: 7.72 cm/s
LVOT SV: 93 mL
LVOT VTI: 32.9 cm
LVOT area: 2.84 cm2
LVOT diameter: 19 mm
LVOT peak grad rest: 9 mmHg
LVOT peak vel: 149 cm/s
Lateral S' vel: 12.7 cm/s
MV Dec: 331
MV pk A vel: 98 m/s
MV pk E vel: 61.1 m/s
TAPSE: 24 mm
TDI e' lateral: 7.72
TDI e' medial: 6.64

## 2017-01-02 ENCOUNTER — Encounter (HOSPITAL_BASED_OUTPATIENT_CLINIC_OR_DEPARTMENT_OTHER): Payer: Medicare Other

## 2017-01-27 ENCOUNTER — Other Ambulatory Visit: Payer: Self-pay | Admitting: Orthopedic Surgery

## 2017-01-30 DIAGNOSIS — F329 Major depressive disorder, single episode, unspecified: Secondary | ICD-10-CM | POA: Diagnosis not present

## 2017-01-30 DIAGNOSIS — J3089 Other allergic rhinitis: Secondary | ICD-10-CM | POA: Diagnosis not present

## 2017-01-30 DIAGNOSIS — Z6841 Body Mass Index (BMI) 40.0 and over, adult: Secondary | ICD-10-CM | POA: Diagnosis not present

## 2017-02-09 NOTE — H&P (Signed)
Alexis Edwards is an 71 y.o. female.    Chief Complaint: left shoulder pain and weakness  HPI: Pt is a 71 y.o. female complaining of left shoulder pain for multiple years. Pain had continually increased since the beginning. X-rays in the clinic show end-stage arthritic changes of the left shoulder. Pt has tried various conservative treatments which have failed to alleviate their symptoms, including injections and therapy. Various options are discussed with the patient. Risks, benefits and expectations were discussed with the patient. Patient understand the risks, benefits and expectations and wishes to proceed with surgery.   PCP:  Leonides Sake, MD  D/C Plans: Home  PMH: Past Medical History:  Diagnosis Date  . CHF (congestive heart failure) ([place])   . Depression   . Dyslipidemia   . HTN (hypertension)   . Hyperlipidemia   . Obesity    BMI 55  . Sleep apnea    C-pap intol  . Sleep apnea   . Takotsubo syndrome April 2013   apical ballooning, [facility] [place]    PSH: Past Surgical History:  Procedure Laterality Date  . ABDOMINAL HYSTERECTOMY    . CARDIAC CATHETERIZATION    . CHOLECYSTECTOMY  2/10  . TOTAL KNEE ARTHROPLASTY  5/12   Lt    Social History:  reports that she quit smoking about 30 years ago. Her smoking use included Cigarettes. She has a 20.00 pack-year smoking history. She has never used smokeless tobacco. She reports that she drinks alcohol. Her drug history is not on file.  Allergies:  Allergies  Allergen Reactions  . Macrobid [Nitrofurantoin] Itching and Rash  . Sulfa Antibiotics Itching and Rash    Medications: No current facility-administered medications for this encounter.    Current Outpatient Prescriptions  Medication Sig Dispense Refill  . Ascorbic Acid (VITAMIN C PO) Take 1 tablet by mouth 2 (two) times daily.    . B Complex-C (B-COMPLEX WITH VITAMIN C) tablet Take 1 tablet by mouth daily. PLAIN vitamin B complex without Vit C    .  calcium carbonate (TUMS - DOSED IN MG ELEMENTAL CALCIUM) 500 MG chewable tablet Chew 6 tablets by mouth daily as needed for heartburn.    [name] CALCIUM PO Take 1,000 mg by mouth.    . DULoxetine (CYMBALTA) 60 MG capsule Take 60 mg by mouth daily.    . furosemide (LASIX) 20 MG tablet Take 20 mg by mouth.    . Multiple Vitamin (MULTIVITAMIN WITH MINERALS) TABS tablet Take 1 tablet by mouth daily.    [name] omega-3 acid ethyl esters (LOVAZA) 1 G capsule Take 1 g by mouth 2 (two) times daily.    . Oxycodone HCl 10 MG TABS Take 10 mg by mouth every 6 (six) hours as needed (pain).    [name] solifenacin (VESICARE) 10 MG tablet Take 10 mg by mouth daily.    [name] VITAMIN D, ERGOCALCIFEROL, PO Take 1 tablet by mouth every evening.      No results found for this or any previous visit (from the past 48 hour(s)). No results found.  ROS: Pain with rom of the left upper extremity  Physical Exam:  Alert and oriented 71 y.o. female in no acute distress Cranial nerves 2-12 intact Cervical spine: full rom with no tenderness, nv intact distally Chest: active breath sounds bilaterally, no wheeze rhonchi or rales Heart: regular rate and rhythm, no murmur Abd: non tender non distended with active bowel sounds Hip is stable with rom  Left shoulder with limited rom and  strength  nv intact distally No rashes or edema  Assessment/Plan Assessment: left shoulder rotator cuff arthropathy  Plan: Patient will undergo a left reverse total shoulder by Dr. Veverly Fells at [facility]. Risks benefits and expectations were discussed with the patient. Patient understand risks, benefits and expectations and wishes to proceed.

## 2017-02-13 ENCOUNTER — Ambulatory Visit (INDEPENDENT_AMBULATORY_CARE_PROVIDER_SITE_OTHER): Payer: Medicare Other | Admitting: Cardiovascular Disease

## 2017-02-13 ENCOUNTER — Encounter: Payer: Self-pay | Admitting: Cardiovascular Disease

## 2017-02-13 VITALS — BP 150/90 | HR 77 | Ht 59.0 in | Wt 272.0 lb

## 2017-02-13 DIAGNOSIS — G4733 Obstructive sleep apnea (adult) (pediatric): Secondary | ICD-10-CM | POA: Diagnosis not present

## 2017-02-13 DIAGNOSIS — Z01818 Encounter for other preprocedural examination: Secondary | ICD-10-CM | POA: Diagnosis not present

## 2017-02-13 DIAGNOSIS — F418 Other specified anxiety disorders: Secondary | ICD-10-CM | POA: Diagnosis not present

## 2017-02-13 DIAGNOSIS — Z6841 Body Mass Index (BMI) 40.0 and over, adult: Secondary | ICD-10-CM | POA: Diagnosis not present

## 2017-02-13 DIAGNOSIS — E785 Hyperlipidemia, unspecified: Secondary | ICD-10-CM

## 2017-02-13 DIAGNOSIS — I5181 Takotsubo syndrome: Secondary | ICD-10-CM

## 2017-02-13 DIAGNOSIS — I10 Essential (primary) hypertension: Secondary | ICD-10-CM

## 2017-02-13 NOTE — Patient Instructions (Signed)
Your physician wants you to follow-up in: 1 YEAR OR SOONER IF NEEDED. You will receive a reminder letter in the mail two months in advance. If you don't receive a letter, please call our office to schedule the follow-up appointment.   If you need a refill on your cardiac medications before your next appointment, please call your pharmacy.    You have been given clearance to have your shoulder surgery.
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
## 2017-02-15 ENCOUNTER — Encounter (HOSPITAL_COMMUNITY): Payer: Self-pay

## 2017-02-15 ENCOUNTER — Encounter (HOSPITAL_COMMUNITY)
Admission: RE | Admit: 2017-02-15 | Discharge: 2017-02-15 | Disposition: A | Discharge status: Home / Self Care | Payer: Medicare Other | Source: Ambulatory Visit | Attending: Orthopedic Surgery | Admitting: Orthopedic Surgery

## 2017-02-15 DIAGNOSIS — Z01812 Encounter for preprocedural laboratory examination: Secondary | ICD-10-CM | POA: Diagnosis not present

## 2017-02-15 DIAGNOSIS — M19012 Primary osteoarthritis, left shoulder: Secondary | ICD-10-CM | POA: Diagnosis not present

## 2017-02-15 DIAGNOSIS — Z01818 Encounter for other preprocedural examination: Secondary | ICD-10-CM | POA: Diagnosis present

## 2017-02-15 HISTORY — DX: Cardiac murmur, unspecified: R01.1

## 2017-02-15 HISTORY — DX: Personal history of urinary calculi: Z87.442

## 2017-02-15 HISTORY — DX: Chronic obstructive pulmonary disease, unspecified: J44.9

## 2017-02-15 HISTORY — DX: Pneumonia, unspecified organism: J18.9

## 2017-02-15 HISTORY — DX: Unspecified osteoarthritis, unspecified site: M19.90

## 2017-02-15 HISTORY — DX: Gastro-esophageal reflux disease without esophagitis: K21.9

## 2017-02-15 LAB — BASIC METABOLIC PANEL
Anion gap: 8 (ref 5–15)
BUN: 9 mg/dL (ref 6–20)
CO2: 28 mmol/L (ref 22–32)
Calcium: 9.1 mg/dL (ref 8.9–10.3)
Chloride: 107 mmol/L (ref 101–111)
Creatinine, Ser: 1.03 mg/dL — ABNORMAL HIGH (ref 0.44–1.00)
GFR calc Af Amer: >60 mL/min (ref >60)
GFR calc non Af Amer: 53 mL/min — ABNORMAL LOW (ref >60)
Glucose, Bld: 136 mg/dL — ABNORMAL HIGH (ref 65–99)
Potassium: 4.4 mmol/L (ref 3.5–5.1)
Sodium: 143 mmol/L (ref 135–145)

## 2017-02-15 LAB — CBC
HCT: 44.1 % (ref 36.0–46.0)
Hemoglobin: 14.6 g/dL (ref 12.0–15.0)
MCH: 32.7 pg (ref 26.0–34.0)
MCHC: 33.1 g/dL (ref 30.0–36.0)
MCV: 98.7 fL (ref 78.0–100.0)
Platelets: 262 10*3/uL (ref 150–400)
RBC: 4.47 MIL/uL (ref 3.87–5.11)
RDW: 12.5 % (ref 11.5–15.5)
WBC: 9.8 10*3/uL (ref 4.0–10.5)

## 2017-02-15 LAB — SURGICAL PCR SCREEN
MRSA, PCR: NEGATIVE
Staphylococcus aureus: NEGATIVE

## 2017-02-15 NOTE — Pre-Procedure Instructions (Addendum)
Alexis Edwards  02/15/2017      [id number] - Alexis Edwards, Alexis Edwards [address] [place] [address] Phone: [phone] Fax: [phone]  [facility] [place], [place] [address] Phone: [phone] Fax: [phone]    Your procedure is scheduled on 02/24/17.  Report to [facility] Admitting at 530 A.M.  Call this number if you have problems the morning of surgery:  [phone]   Remember:  Do not eat food or drink liquids after midnight.  Take these medicines the morning of surgery with A SIP OF WATER    Duloxetine(cymbalta),omeprazole, oxycodone if needed  STOP all herbel meds, nsaids (aleve,naproxen,advil,ibuprofen) prior to surgery starting 02/17/17 including all vitamins/supplements, Fish oil,aspirin   Do not wear jewelry, make-up or nail polish.  Do not wear lotions, powders, or perfumes, or deoderant.  Do not shave 48 hours prior to surgery.  Men may shave face and neck.  Do not bring valuables to the hospital.  [facility] is not responsible for any belongings or valuables.  Contacts, dentures or bridgework may not be worn into surgery.  Leave your suitcase in the car.  After surgery it may be brought to your room.  For patients admitted to the hospital, discharge time will be determined by your treatment team.  Patients discharged the day of surgery will not be allowed to drive home.   Special instructions:   Special Instructions: [place] - Preparing for Surgery  Before surgery, you can play an important role.  Because skin is not sterile, your skin needs to be as free of germs as possible.  You can reduce the number of germs on you skin by washing with CHG (chlorahexidine gluconate) soap before surgery.  CHG is an antiseptic cleaner which kills germs and bonds with the skin to continue killing germs even after washing.  Please DO NOT use if you have an  allergy to CHG or antibacterial soaps.  If your skin becomes reddened/irritated stop using the CHG and inform your nurse when you arrive at Short Stay.  Do not shave (including legs and underarms) for at least 48 hours prior to the first CHG shower.  You may shave your face.  Please follow these instructions carefully:   1.  Shower with CHG Soap the night before surgery and the morning of Surgery.  2.  If you choose to wash your hair, wash your hair first as usual with your normal shampoo.  3.  After you shampoo, rinse your hair and body thoroughly to remove the Shampoo.  4.  Use CHG as you would any other liquid soap.  You can apply chg directly  to the skin and wash gently with scrungie or a clean washcloth.  5.  Apply the CHG Soap to your body ONLY FROM THE NECK DOWN.  Do not use on open wounds or open sores.  Avoid contact with your eyes ears, mouth and genitals (private parts).  Wash genitals (private parts)       with your normal soap.  6.  Wash thoroughly, paying special attention to the area where your surgery will be performed.  7.  Thoroughly rinse your body with warm water from the neck down.  8.  DO NOT shower/wash with your normal soap after using and rinsing off the CHG Soap.  9.  Pat yourself dry with a clean towel.  10.  Wear clean pajamas.            11.  Place clean sheets on your bed the night of your first shower and do not sleep with pets.  Day of Surgery  Do not apply any lotions/deodorants the morning of surgery.  Please wear clean clothes to the hospital/surgery center.  Please read over the  fact sheets that you were given.

## 2017-02-16 NOTE — Progress Notes (Addendum)
Anesthesia Chart Review:  Pt is a 71 year old female scheduled for reverse L shoulder arthroplasty on 02/24/2017 with Netta Cedars, M.D.  - PCP is Daiva Eves, MD - Cardiologist is Shelva Majestic, MD, who cleared pt for surgery at last office visit 02/13/17.   PMH includes: CHF, Takotsubo syndrome (EF now recovered), HTN, heart murmur, hyperlipidemia, OSA, COPD, GERD. Former smoker. BMI 55.  Medications include: Lasix, Prilosec  Preoperative labs reviewed.    EKG 02/13/17: NSR. Septal infarct, age undetermined.  Echo 12/26/16:  - Left ventricle: The cavity size was normal. Wall thickness was increased in a pattern of mild LVH. Systolic function was vigorous. The estimated ejection fraction was in the range of 65% to 70%. Wall motion was normal; there were no regional wall motion abnormalities. Doppler parameters are consistent with abnormal left ventricular relaxation (grade 1 diastolic dysfunction). Doppler parameters are consistent with indeterminate ventricular filling pressure. - Aortic valve: Transvalvular velocity was within the normal range. There was no stenosis. There was no regurgitation. - Mitral valve: Transvalvular velocity was within the normal range. There was no evidence for stenosis. There was no regurgitation. - Right ventricle: The cavity size was normal. Wall thickness was normal. Systolic function was normal. - Tricuspid valve: There was trivial regurgitation. - Pulmonic valve: There was no regurgitation. - Pulmonary arteries: Systolic pressure was within the normal range. PA peak pressure: 28 mm Hg (S).  Cardiac cath 02/02/12 at [facility]: by notes, normal coronaries, apical ballooning.   If no changes, I anticipate pt can proceed with surgery as scheduled.   Willeen Cass, FNP-BC [facility]/Anesthesiology Phone: [phone] 02/16/2017 4:55 PM

## 2017-02-23 MED ORDER — DEXTROSE 5 % IV SOLN
3.0000 g | INTRAVENOUS | Status: AC
  Administered 2017-02-24: 3 g via INTRAVENOUS
  Filled 2017-02-23: qty 3000

## 2017-02-24 ENCOUNTER — Encounter (HOSPITAL_COMMUNITY)
Admission: RE | Disposition: A | Discharge status: Home Health | Payer: Self-pay | Source: Ambulatory Visit | Attending: Orthopedic Surgery

## 2017-02-24 ENCOUNTER — Inpatient Hospital Stay (HOSPITAL_COMMUNITY): Payer: Medicare Other

## 2017-02-24 ENCOUNTER — Inpatient Hospital Stay (HOSPITAL_COMMUNITY): Payer: Medicare Other | Admitting: Emergency Medicine

## 2017-02-24 ENCOUNTER — Inpatient Hospital Stay (HOSPITAL_COMMUNITY)
Admission: RE | Admit: 2017-02-24 | Discharge: 2017-02-27 | DRG: 483 | Disposition: A | Discharge status: Home Health | Payer: Medicare Other | Source: Ambulatory Visit | Attending: Orthopedic Surgery | Admitting: Orthopedic Surgery

## 2017-02-24 ENCOUNTER — Encounter (HOSPITAL_COMMUNITY): Payer: Self-pay | Admitting: Anesthesiology

## 2017-02-24 ENCOUNTER — Inpatient Hospital Stay (HOSPITAL_COMMUNITY): Payer: Medicare Other | Admitting: Anesthesiology

## 2017-02-24 DIAGNOSIS — Z471 Aftercare following joint replacement surgery: Secondary | ICD-10-CM | POA: Diagnosis not present

## 2017-02-24 DIAGNOSIS — M19012 Primary osteoarthritis, left shoulder: Secondary | ICD-10-CM | POA: Diagnosis not present

## 2017-02-24 DIAGNOSIS — I509 Heart failure, unspecified: Secondary | ICD-10-CM | POA: Diagnosis not present

## 2017-02-24 DIAGNOSIS — J449 Chronic obstructive pulmonary disease, unspecified: Secondary | ICD-10-CM | POA: Diagnosis present

## 2017-02-24 DIAGNOSIS — Z6841 Body Mass Index (BMI) 40.0 and over, adult: Secondary | ICD-10-CM

## 2017-02-24 DIAGNOSIS — Z87891 Personal history of nicotine dependence: Secondary | ICD-10-CM | POA: Diagnosis not present

## 2017-02-24 DIAGNOSIS — K219 Gastro-esophageal reflux disease without esophagitis: Secondary | ICD-10-CM | POA: Diagnosis not present

## 2017-02-24 DIAGNOSIS — I110 Hypertensive heart disease with heart failure: Secondary | ICD-10-CM | POA: Diagnosis present

## 2017-02-24 DIAGNOSIS — Z972 Presence of dental prosthetic device (complete) (partial): Secondary | ICD-10-CM | POA: Diagnosis not present

## 2017-02-24 DIAGNOSIS — Z96612 Presence of left artificial shoulder joint: Secondary | ICD-10-CM

## 2017-02-24 DIAGNOSIS — Z90710 Acquired absence of both cervix and uterus: Secondary | ICD-10-CM

## 2017-02-24 DIAGNOSIS — J9589 Other postprocedural complications and disorders of respiratory system, not elsewhere classified: Secondary | ICD-10-CM | POA: Diagnosis not present

## 2017-02-24 DIAGNOSIS — Z881 Allergy status to other antibiotic agents status: Secondary | ICD-10-CM | POA: Diagnosis not present

## 2017-02-24 DIAGNOSIS — R509 Fever, unspecified: Secondary | ICD-10-CM

## 2017-02-24 DIAGNOSIS — Z9049 Acquired absence of other specified parts of digestive tract: Secondary | ICD-10-CM | POA: Diagnosis not present

## 2017-02-24 DIAGNOSIS — Z882 Allergy status to sulfonamides status: Secondary | ICD-10-CM | POA: Diagnosis not present

## 2017-02-24 DIAGNOSIS — E785 Hyperlipidemia, unspecified: Secondary | ICD-10-CM | POA: Diagnosis present

## 2017-02-24 DIAGNOSIS — Z79899 Other long term (current) drug therapy: Secondary | ICD-10-CM | POA: Diagnosis not present

## 2017-02-24 DIAGNOSIS — G4730 Sleep apnea, unspecified: Secondary | ICD-10-CM | POA: Diagnosis present

## 2017-02-24 DIAGNOSIS — R269 Unspecified abnormalities of gait and mobility: Secondary | ICD-10-CM | POA: Diagnosis not present

## 2017-02-24 DIAGNOSIS — Z96652 Presence of left artificial knee joint: Secondary | ICD-10-CM | POA: Diagnosis present

## 2017-02-24 DIAGNOSIS — M75102 Unspecified rotator cuff tear or rupture of left shoulder, not specified as traumatic: Secondary | ICD-10-CM | POA: Diagnosis not present

## 2017-02-24 DIAGNOSIS — Z79891 Long term (current) use of opiate analgesic: Secondary | ICD-10-CM | POA: Diagnosis not present

## 2017-02-24 DIAGNOSIS — F329 Major depressive disorder, single episode, unspecified: Secondary | ICD-10-CM | POA: Diagnosis present

## 2017-02-24 DIAGNOSIS — R7981 Abnormal blood-gas level: Secondary | ICD-10-CM

## 2017-02-24 DIAGNOSIS — J9811 Atelectasis: Secondary | ICD-10-CM | POA: Diagnosis not present

## 2017-02-24 DIAGNOSIS — E669 Obesity, unspecified: Secondary | ICD-10-CM | POA: Diagnosis not present

## 2017-02-24 DIAGNOSIS — G8918 Other acute postprocedural pain: Secondary | ICD-10-CM | POA: Diagnosis not present

## 2017-02-24 HISTORY — PX: REVERSE SHOULDER ARTHROPLASTY: SHX5054

## 2017-02-24 SURGERY — ARTHROPLASTY, SHOULDER, TOTAL, REVERSE
Anesthesia: General | Site: Shoulder | Laterality: Left

## 2017-02-24 MED ORDER — ACETAMINOPHEN 325 MG PO TABS
650.0000 mg | ORAL_TABLET | Freq: Four times a day (QID) | ORAL | Status: DC | PRN
  Administered 2017-02-25 – 2017-02-26 (×2): 650 mg via ORAL
  Filled 2017-02-24 (×2): qty 2

## 2017-02-24 MED ORDER — CHLORHEXIDINE GLUCONATE 4 % EX LIQD: 60.0000 mL | Freq: Once | CUTANEOUS | Status: DC

## 2017-02-24 MED ORDER — POLYETHYLENE GLYCOL 3350 17 G PO PACK
17.0000 g | PACK | Freq: Every day | ORAL | Status: DC | PRN
  Administered 2017-02-26: 17 g via ORAL
  Filled 2017-02-24: qty 1

## 2017-02-24 MED ORDER — B COMPLEX-C PO TABS
1.0000 | ORAL_TABLET | Freq: Every day | ORAL | Status: DC
Start: 2017-02-25 — End: 2017-02-27
  Administered 2017-02-25 – 2017-02-27 (×3): 1 via ORAL
  Filled 2017-02-24 (×3): qty 1

## 2017-02-24 MED ORDER — MIDAZOLAM HCL 2 MG/2ML IJ SOLN
INTRAMUSCULAR | Status: AC
  Filled 2017-02-24: qty 2

## 2017-02-24 MED ORDER — METOCLOPRAMIDE HCL 5 MG PO TABS: 5.0000 mg | ORAL_TABLET | Freq: Three times a day (TID) | ORAL | Status: DC | PRN

## 2017-02-24 MED ORDER — DARIFENACIN HYDROBROMIDE ER 15 MG PO TB24
15.0000 mg | ORAL_TABLET | Freq: Every day | ORAL | Status: DC
Start: 2017-02-24 — End: 2017-02-27
  Administered 2017-02-24 – 2017-02-26 (×3): 15 mg via ORAL
  Filled 2017-02-24 (×3): qty 1

## 2017-02-24 MED ORDER — PHENOL 1.4 % MT LIQD: 1.0000 | OROMUCOSAL | Status: DC | PRN

## 2017-02-24 MED ORDER — FENTANYL CITRATE (PF) 100 MCG/2ML IJ SOLN
INTRAMUSCULAR | Status: DC | PRN
  Administered 2017-02-24 (×2): 50 ug via INTRAVENOUS
  Administered 2017-02-24: 25 ug via INTRAVENOUS
  Administered 2017-02-24: 50 ug via INTRAVENOUS

## 2017-02-24 MED ORDER — ADULT MULTIVITAMIN W/MINERALS CH
1.0000 | ORAL_TABLET | Freq: Every day | ORAL | Status: DC
  Administered 2017-02-25 – 2017-02-27 (×3): 1 via ORAL
  Filled 2017-02-24 (×3): qty 1

## 2017-02-24 MED ORDER — PHENYLEPHRINE HCL 10 MG/ML IJ SOLN
INTRAVENOUS | Status: DC | PRN
  Administered 2017-02-24: 25 ug/min via INTRAVENOUS

## 2017-02-24 MED ORDER — LORATADINE 10 MG PO TABS: 10.0000 mg | ORAL_TABLET | Freq: Every day | ORAL | Status: DC | PRN

## 2017-02-24 MED ORDER — EPINEPHRINE PF 1 MG/ML IJ SOLN
INTRAMUSCULAR | Status: DC | PRN
  Administered 2017-02-24: .15 mL

## 2017-02-24 MED ORDER — ONDANSETRON HCL 4 MG/2ML IJ SOLN: 4.0000 mg | Freq: Four times a day (QID) | INTRAMUSCULAR | Status: DC | PRN

## 2017-02-24 MED ORDER — CEFAZOLIN SODIUM-DEXTROSE 2-4 GM/100ML-% IV SOLN
2.0000 g | Freq: Four times a day (QID) | INTRAVENOUS | Status: AC
  Administered 2017-02-24 – 2017-02-25 (×3): 2 g via INTRAVENOUS
  Filled 2017-02-24 (×3): qty 100

## 2017-02-24 MED ORDER — MORPHINE SULFATE (PF) 4 MG/ML IV SOLN: 2.0000 mg | INTRAVENOUS | Status: DC | PRN

## 2017-02-24 MED ORDER — DOCUSATE SODIUM 100 MG PO CAPS
100.0000 mg | ORAL_CAPSULE | Freq: Two times a day (BID) | ORAL | Status: DC
  Administered 2017-02-24 – 2017-02-27 (×6): 100 mg via ORAL
  Filled 2017-02-24 (×6): qty 1

## 2017-02-24 MED ORDER — METOCLOPRAMIDE HCL 5 MG/ML IJ SOLN: 5.0000 mg | Freq: Three times a day (TID) | INTRAMUSCULAR | Status: DC | PRN

## 2017-02-24 MED ORDER — ROCURONIUM BROMIDE 10 MG/ML (PF) SYRINGE
PREFILLED_SYRINGE | INTRAVENOUS | Status: AC
  Filled 2017-02-24: qty 5

## 2017-02-24 MED ORDER — BUPIVACAINE HCL (PF) 0.25 % IJ SOLN
INTRAMUSCULAR | Status: AC
  Filled 2017-02-24: qty 30

## 2017-02-24 MED ORDER — ONDANSETRON HCL 4 MG/2ML IJ SOLN
INTRAMUSCULAR | Status: AC
  Filled 2017-02-24: qty 2

## 2017-02-24 MED ORDER — METHOCARBAMOL 500 MG PO TABS
500.0000 mg | ORAL_TABLET | Freq: Four times a day (QID) | ORAL | Status: DC | PRN
  Administered 2017-02-25 – 2017-02-26 (×3): 500 mg via ORAL
  Filled 2017-02-24 (×3): qty 1

## 2017-02-24 MED ORDER — BUPIVACAINE HCL (PF) 0.25 % IJ SOLN
INTRAMUSCULAR | Status: DC | PRN
  Administered 2017-02-24: 10 mL

## 2017-02-24 MED ORDER — DEXTROSE 5 % IV SOLN
500.0000 mg | Freq: Four times a day (QID) | INTRAVENOUS | Status: DC | PRN
  Filled 2017-02-24: qty 5

## 2017-02-24 MED ORDER — LIDOCAINE 2% (20 MG/ML) 5 ML SYRINGE
INTRAMUSCULAR | Status: AC
  Filled 2017-02-24: qty 5

## 2017-02-24 MED ORDER — SODIUM CHLORIDE 0.9 % IV SOLN
INTRAVENOUS | Status: DC
  Administered 2017-02-24: 21:00:00 via INTRAVENOUS

## 2017-02-24 MED ORDER — ONDANSETRON HCL 4 MG PO TABS: 4.0000 mg | ORAL_TABLET | Freq: Four times a day (QID) | ORAL | Status: DC | PRN

## 2017-02-24 MED ORDER — PROPOFOL 10 MG/ML IV BOLUS
INTRAVENOUS | Status: AC
  Filled 2017-02-24: qty 20

## 2017-02-24 MED ORDER — PANTOPRAZOLE SODIUM 40 MG PO TBEC
80.0000 mg | DELAYED_RELEASE_TABLET | Freq: Every day | ORAL | Status: DC
  Administered 2017-02-25 – 2017-02-27 (×3): 80 mg via ORAL
  Filled 2017-02-24 (×4): qty 2

## 2017-02-24 MED ORDER — METHOCARBAMOL 500 MG PO TABS: 500.0000 mg | ORAL_TABLET | Freq: Three times a day (TID) | ORAL | 1 refills | Status: DC | PRN

## 2017-02-24 MED ORDER — SODIUM CHLORIDE 0.9 % IR SOLN
Status: DC | PRN
  Administered 2017-02-24: 1000 mL

## 2017-02-24 MED ORDER — OMEGA-3-ACID ETHYL ESTERS 1 G PO CAPS
1.0000 g | ORAL_CAPSULE | Freq: Two times a day (BID) | ORAL | Status: DC
  Administered 2017-02-24 – 2017-02-27 (×6): 1 g via ORAL
  Filled 2017-02-24 (×6): qty 1

## 2017-02-24 MED ORDER — LEVOCETIRIZINE DIHYDROCHLORIDE 5 MG PO TABS: 5.0000 mg | ORAL_TABLET | Freq: Every day | ORAL | Status: DC | PRN

## 2017-02-24 MED ORDER — FENTANYL CITRATE (PF) 250 MCG/5ML IJ SOLN
INTRAMUSCULAR | Status: AC
  Filled 2017-02-24: qty 5

## 2017-02-24 MED ORDER — FENTANYL CITRATE (PF) 100 MCG/2ML IJ SOLN: 25.0000 ug | INTRAMUSCULAR | Status: DC | PRN

## 2017-02-24 MED ORDER — LACTATED RINGERS IV SOLN
INTRAVENOUS | Status: DC | PRN
  Administered 2017-02-24: 07:00:00 via INTRAVENOUS

## 2017-02-24 MED ORDER — OXYCODONE HCL 5 MG PO TABS: 10.0000 mg | ORAL_TABLET | ORAL | 0 refills | Status: DC | PRN

## 2017-02-24 MED ORDER — PROPOFOL 10 MG/ML IV BOLUS
INTRAVENOUS | Status: DC | PRN
  Administered 2017-02-24: 190 mg via INTRAVENOUS

## 2017-02-24 MED ORDER — OXYCODONE HCL 5 MG PO TABS
10.0000 mg | ORAL_TABLET | Freq: Four times a day (QID) | ORAL | Status: DC
  Administered 2017-02-24 – 2017-02-26 (×10): 10 mg via ORAL
  Filled 2017-02-24 (×11): qty 2

## 2017-02-24 MED ORDER — SODIUM CHLORIDE 0.9 % IV SOLN
1000.0000 mg | INTRAVENOUS | Status: AC
  Administered 2017-02-24: 1000 mg via INTRAVENOUS
  Filled 2017-02-24: qty 10

## 2017-02-24 MED ORDER — EPHEDRINE SULFATE-NACL 50-0.9 MG/10ML-% IV SOSY
PREFILLED_SYRINGE | INTRAVENOUS | Status: DC | PRN
  Administered 2017-02-24: 10 mg via INTRAVENOUS
  Administered 2017-02-24: 5 mg via INTRAVENOUS
  Administered 2017-02-24: 10 mg via INTRAVENOUS

## 2017-02-24 MED ORDER — FUROSEMIDE 20 MG PO TABS
20.0000 mg | ORAL_TABLET | Freq: Every day | ORAL | Status: DC
  Administered 2017-02-24 – 2017-02-27 (×4): 20 mg via ORAL
  Filled 2017-02-24 (×4): qty 1

## 2017-02-24 MED ORDER — EPHEDRINE 5 MG/ML INJ
INTRAVENOUS | Status: AC
  Filled 2017-02-24: qty 10

## 2017-02-24 MED ORDER — TRANEXAMIC ACID 1000 MG/10ML IV SOLN
1000.0000 mg | Freq: Once | INTRAVENOUS | Status: AC
  Administered 2017-02-24: 1000 mg via INTRAVENOUS
  Filled 2017-02-24: qty 10

## 2017-02-24 MED ORDER — ACETAMINOPHEN 650 MG RE SUPP: 650.0000 mg | Freq: Four times a day (QID) | RECTAL | Status: DC | PRN

## 2017-02-24 MED ORDER — MENTHOL 3 MG MT LOZG: 1.0000 | LOZENGE | OROMUCOSAL | Status: DC | PRN

## 2017-02-24 MED ORDER — SUCCINYLCHOLINE CHLORIDE 20 MG/ML IJ SOLN
INTRAMUSCULAR | Status: DC | PRN
  Administered 2017-02-24: 140 mg via INTRAVENOUS

## 2017-02-24 MED ORDER — SUCCINYLCHOLINE CHLORIDE 200 MG/10ML IV SOSY
PREFILLED_SYRINGE | INTRAVENOUS | Status: AC
  Filled 2017-02-24: qty 10

## 2017-02-24 MED ORDER — VITAMIN D 1000 UNITS PO TABS
1000.0000 [IU] | ORAL_TABLET | Freq: Every day | ORAL | Status: DC
Start: 2017-02-24 — End: 2017-02-27
  Administered 2017-02-24 – 2017-02-26 (×3): 1000 [IU] via ORAL
  Filled 2017-02-24 (×3): qty 1

## 2017-02-24 MED ORDER — EPINEPHRINE PF 1 MG/ML IJ SOLN
INTRAMUSCULAR | Status: AC
  Filled 2017-02-24: qty 1

## 2017-02-24 MED ORDER — ONDANSETRON HCL 4 MG/2ML IJ SOLN
INTRAMUSCULAR | Status: DC | PRN
  Administered 2017-02-24 (×2): 4 mg via INTRAVENOUS

## 2017-02-24 MED ORDER — ASPIRIN 81 MG PO CHEW
81.0000 mg | CHEWABLE_TABLET | Freq: Every day | ORAL | Status: DC
  Administered 2017-02-24 – 2017-02-27 (×4): 81 mg via ORAL
  Filled 2017-02-24 (×4): qty 1

## 2017-02-24 MED ORDER — LIDOCAINE HCL (CARDIAC) 20 MG/ML IV SOLN
INTRAVENOUS | Status: DC | PRN
  Administered 2017-02-24: 100 mg via INTRAVENOUS

## 2017-02-24 MED ORDER — DULOXETINE HCL 60 MG PO CPEP
60.0000 mg | ORAL_CAPSULE | Freq: Every day | ORAL | Status: DC
  Administered 2017-02-25 – 2017-02-27 (×3): 60 mg via ORAL
  Filled 2017-02-24 (×3): qty 1

## 2017-02-24 SURGICAL SUPPLY — 68 items
APL SKNCLS STERI-STRIP NONHPOA (GAUZE/BANDAGES/DRESSINGS) ×1
BENZOIN TINCTURE PRP APPL 2/3 (GAUZE/BANDAGES/DRESSINGS) ×1 IMPLANT
BIT DRILL 5/64X5 DISP (BIT) ×2 IMPLANT
BLADE SAG 18X100X1.27 (BLADE) ×2 IMPLANT
CAPT SHLDR REVTOTAL 1 ×1 IMPLANT
CEMENT HV SMART SET (Cement) ×1 IMPLANT
COVER SURGICAL LIGHT HANDLE (MISCELLANEOUS) ×2 IMPLANT
DRAPE IMP U-DRAPE 54X76 (DRAPES) ×4 IMPLANT
DRAPE INCISE IOBAN 66X45 STRL (DRAPES) ×2 IMPLANT
DRAPE ORTHO SPLIT 77X108 STRL (DRAPES) ×4
DRAPE SURG ORHT 6 SPLT 77X108 (DRAPES) ×2 IMPLANT
DRAPE U-SHAPE 47X51 STRL (DRAPES) ×2 IMPLANT
DRAPE X-RAY CASS 24X20 (DRAPES) IMPLANT
DRSG ADAPTIC 3X8 NADH LF (GAUZE/BANDAGES/DRESSINGS) ×2 IMPLANT
DRSG PAD ABDOMINAL 8X10 ST (GAUZE/BANDAGES/DRESSINGS) ×2 IMPLANT
DURAPREP 26ML APPLICATOR (WOUND CARE) ×2 IMPLANT
ELECT BLADE 4.0 EZ CLEAN MEGAD (MISCELLANEOUS) ×2
ELECT NDL TIP 2.8 STRL (NEEDLE) ×1 IMPLANT
ELECT NEEDLE TIP 2.8 STRL (NEEDLE) ×2 IMPLANT
ELECT REM PT RETURN 9FT ADLT (ELECTROSURGICAL) ×2
ELECTRODE BLDE 4.0 EZ CLN MEGD (MISCELLANEOUS) ×1 IMPLANT
ELECTRODE REM PT RTRN 9FT ADLT (ELECTROSURGICAL) ×1 IMPLANT
GAUZE SPONGE 4X4 12PLY STRL (GAUZE/BANDAGES/DRESSINGS) ×2 IMPLANT
GAUZE SPONGE 4X4 12PLY STRL LF (GAUZE/BANDAGES/DRESSINGS) ×1 IMPLANT
GLOVE BIOGEL PI ORTHO PRO 7.5 (GLOVE) ×1
GLOVE BIOGEL PI ORTHO PRO SZ8 (GLOVE) ×1
GLOVE ORTHO TXT STRL SZ7.5 (GLOVE) ×2 IMPLANT
GLOVE PI ORTHO PRO STRL 7.5 (GLOVE) ×1 IMPLANT
GLOVE PI ORTHO PRO STRL SZ8 (GLOVE) ×1 IMPLANT
GLOVE SURG ORTHO 8.5 STRL (GLOVE) ×2 IMPLANT
GOWN STRL REUS W/ TWL LRG LVL3 (GOWN DISPOSABLE) ×1 IMPLANT
GOWN STRL REUS W/ TWL XL LVL3 (GOWN DISPOSABLE) ×2 IMPLANT
GOWN STRL REUS W/TWL LRG LVL3 (GOWN DISPOSABLE) ×2
GOWN STRL REUS W/TWL XL LVL3 (GOWN DISPOSABLE) ×8
HANDPIECE INTERPULSE COAX TIP (DISPOSABLE)
KIT BASIN OR (CUSTOM PROCEDURE TRAY) ×2 IMPLANT
KIT ROOM TURNOVER OR (KITS) ×2 IMPLANT
MANIFOLD NEPTUNE II (INSTRUMENTS) ×2 IMPLANT
NDL 1/2 CIR MAYO (NEEDLE) ×1 IMPLANT
NDL HYPO 25GX1X1/2 BEV (NEEDLE) ×1 IMPLANT
NEEDLE 1/2 CIR MAYO (NEEDLE) ×2 IMPLANT
NEEDLE HYPO 25GX1X1/2 BEV (NEEDLE) ×2 IMPLANT
NS IRRIG 1000ML POUR BTL (IV SOLUTION) ×2 IMPLANT
PACK SHOULDER (CUSTOM PROCEDURE TRAY) ×2 IMPLANT
PAD ABD 8X10 STRL (GAUZE/BANDAGES/DRESSINGS) ×1 IMPLANT
PAD ARMBOARD 7.5X6 YLW CONV (MISCELLANEOUS) ×4 IMPLANT
SET HNDPC FAN SPRY TIP SCT (DISPOSABLE) IMPLANT
SLING ARM FOAM STRAP LRG (SOFTGOODS) ×1 IMPLANT
SLING ARM FOAM STRAP MED (SOFTGOODS) IMPLANT
SPONGE LAP 18X18 X RAY DECT (DISPOSABLE) ×1 IMPLANT
SPONGE LAP 4X18 X RAY DECT (DISPOSABLE) ×2 IMPLANT
STRIP CLOSURE SKIN 1/2X4 (GAUZE/BANDAGES/DRESSINGS) ×2 IMPLANT
SUCTION FRAZIER HANDLE 10FR (MISCELLANEOUS) ×1
SUCTION TUBE FRAZIER 10FR DISP (MISCELLANEOUS) ×1 IMPLANT
SUT FIBERWIRE #2 38 T-5 BLUE (SUTURE) ×4
SUT MNCRL AB 4-0 PS2 18 (SUTURE) ×2 IMPLANT
SUT VIC AB 0 CT2 27 (SUTURE) ×3 IMPLANT
SUT VIC AB 2-0 CT1 27 (SUTURE) ×4
SUT VIC AB 2-0 CT1 TAPERPNT 27 (SUTURE) ×1 IMPLANT
SUTURE FIBERWR #2 38 T-5 BLUE (SUTURE) ×2 IMPLANT
SYR CONTROL 10ML LL (SYRINGE) ×2 IMPLANT
TAPE CLOTH SURG 6X10 WHT LF (GAUZE/BANDAGES/DRESSINGS) ×1 IMPLANT
TOWEL OR 17X24 6PK STRL BLUE (TOWEL DISPOSABLE) ×2 IMPLANT
TOWEL OR 17X26 10 PK STRL BLUE (TOWEL DISPOSABLE) ×2 IMPLANT
TOWER CARTRIDGE SMART MIX (DISPOSABLE) IMPLANT
TRAY FOLEY W/METER SILVER 16FR (SET/KITS/TRAYS/PACK) IMPLANT
WATER STERILE IRR 1000ML POUR (IV SOLUTION) ×2 IMPLANT
YANKAUER SUCT BULB TIP NO VENT (SUCTIONS) ×2 IMPLANT

## 2017-02-24 NOTE — Brief Op Note (Signed)
02/24/2017  9:21 AM  PATIENT:  Alexis Edwards  71 y.o. female  PRE-OPERATIVE DIAGNOSIS:  Left shoulder osteoarthritis, rotator cuff insufficiency  POST-OPERATIVE DIAGNOSIS:  left shoulder osteoarthritis, rotator cuff insufficiency  PROCEDURE:  Procedure(s): REVERSE LEFT SHOULDER ARTHROPLASTY (Left) DePuy Delta Xtend  SURGEON:  Surgeon(s) and Role:    * Netta Cedars, MD - Primary  PHYSICIAN ASSISTANT:   ASSISTANTS: Ventura Bruns, PA-C   ANESTHESIA:   regional and general  EBL:  Total I/O In: -  Out: 250 [Blood:250]  BLOOD ADMINISTERED:none  DRAINS: none   LOCAL MEDICATIONS USED:  MARCAINE     SPECIMEN:  No Specimen  DISPOSITION OF SPECIMEN:  N/A  COUNTS:  YES  TOURNIQUET:  * No tourniquets in log *  DICTATION: .Other Dictation: Dictation Number [id number]  PLAN OF CARE: Admit to inpatient   PATIENT DISPOSITION:  PACU - hemodynamically stable.   Delay start of Pharmacological VTE agent (>24hrs) due to surgical blood loss or risk of bleeding: not applicable

## 2017-02-24 NOTE — Transfer of Care (Signed)
Immediate Anesthesia Transfer of Care Note  Patient: Alexis Edwards  Procedure(s) Performed: Procedure(s): REVERSE LEFT SHOULDER ARTHROPLASTY (Left)  Patient Location: PACU  Anesthesia Type:GA combined with regional for post-op pain  Level of Consciousness: awake, alert  and oriented  Airway & Oxygen Therapy: Patient Spontanous Breathing and Patient connected to face mask oxygen  Post-op Assessment: Report given to RN, Post -op Vital signs reviewed and stable and Patient moving all extremities X 4  Post vital signs: Reviewed and stable  Last Vitals:  Vitals:   02/24/17 0624  BP: (!) 128/57  Pulse: 72  Resp: 20  Temp: 37.2 C    Last Pain:  Vitals:   02/24/17 0624  TempSrc: Oral         Complications: No apparent anesthesia complications

## 2017-02-24 NOTE — Interval H&P Note (Signed)
History and Physical Interval Note:  02/24/2017 7:26 AM  Alexis Edwards  has presented today for surgery, with the diagnosis of Left shoulder osteoarthritis, rotator cuff insufficiency  The various methods of treatment have been discussed with the patient and family. After consideration of risks, benefits and other options for treatment, the patient has consented to  Procedure(s): REVERSE LEFT SHOULDER ARTHROPLASTY (Left) as a surgical intervention .  The patient's history has been reviewed, patient examined, no change in status, stable for surgery.  I have reviewed the patient's chart and labs.  Questions were answered to the patient's satisfaction.     NORRIS,STEVEN R

## 2017-02-24 NOTE — Discharge Instructions (Signed)
Ice to the shoulder at all times.  Please keep bandage over the incision to cover it for one week, then ok to remove and get the incision wet in the shower.  May remove the sling while seated BUT keep a pillow or rolled blanket behind the elbow to keep the arm across the waist.  May use the left arm for light daily activity.  Do not push your body weight up with the arm and do not reach behind your back  Follow up in two weeks in the office [phone]

## 2017-02-24 NOTE — Anesthesia Procedure Notes (Signed)
Anesthesia Regional Block: Interscalene brachial plexus block   Pre-Anesthetic Checklist: ,, timeout performed, Correct Patient, Correct Site, Correct Laterality, Correct Procedure, Correct Position, site marked, Risks and benefits discussed,  Surgical consent,  Pre-op evaluation,  At surgeon's request and post-op pain management  Laterality: Left  Prep: chloraprep       Needles:  Injection technique: Single-shot  Needle Type: Stimulator Needle - 40          Additional Needles:   Procedures: Doppler guided, Ultrasound guided, Nerve stimulator,,,,  Narrative:  Start time: 02/24/2017 6:55 AM End time: 02/24/2017 7:10 AM Injection made incrementally with aspirations every 5 mL.  Performed by: Personally  Anesthesiologist: Belinda Block

## 2017-02-24 NOTE — Progress Notes (Signed)
Iv infusing on arrival to pacu from or

## 2017-02-24 NOTE — Anesthesia Procedure Notes (Signed)
Procedure Name: Intubation Date/Time: 02/24/2017 7:38 AM Performed by: Kyung Rudd Pre-anesthesia Checklist: Patient identified, Emergency Drugs available, Suction available, Patient being monitored and Timeout performed Patient Re-evaluated:Patient Re-evaluated prior to inductionOxygen Delivery Method: Circle system utilized Preoxygenation: Pre-oxygenation with 100% oxygen Intubation Type: IV induction and Rapid sequence Laryngoscope Size: Mac and 3 Grade View: Grade I Tube type: Oral Tube size: 7.5 mm Number of attempts: 1 Airway Equipment and Method: Stylet Placement Confirmation: ETT inserted through vocal cords under direct vision,  positive ETCO2 and breath sounds checked- equal and bilateral Secured at: 21 cm Tube secured with: Tape Dental Injury: Teeth and Oropharynx as per pre-operative assessment

## 2017-02-24 NOTE — Anesthesia Preprocedure Evaluation (Addendum)
Anesthesia Evaluation  Patient identified by MRN, date of birth, ID band Patient awake    Reviewed: Allergy & Precautions, NPO status , Patient's Chart, lab work & pertinent test results  Airway Mallampati: II  TM Distance: >3 FB     Dental  (+) Edentulous Upper, Edentulous Lower   Pulmonary sleep apnea , pneumonia, COPD, former smoker,    breath sounds clear to auscultation       Cardiovascular hypertension, +CHF   Rhythm:Regular Rate:Normal     Neuro/Psych    GI/Hepatic Neg liver ROS, GERD  ,  Endo/Other  negative endocrine ROS  Renal/GU negative Renal ROS     Musculoskeletal  (+) Arthritis ,   Abdominal   Peds  Hematology   Anesthesia Other Findings   Reproductive/Obstetrics                           Anesthesia Physical Anesthesia Plan  ASA: III  Anesthesia Plan: General   Post-op Pain Management:    Induction:   Airway Management Planned: Oral ETT  Additional Equipment:   Intra-op Plan:   Post-operative Plan: Possible Post-op intubation/ventilation  Informed Consent: I have reviewed the patients History and Physical, chart, labs and discussed the procedure including the risks, benefits and alternatives for the proposed anesthesia with the patient or authorized representative who has indicated his/her understanding and acceptance.   Dental advisory given  Plan Discussed with: CRNA, Anesthesiologist and Surgeon  Anesthesia Plan Comments:        Anesthesia Quick Evaluation

## 2017-02-24 NOTE — Anesthesia Postprocedure Evaluation (Signed)
Anesthesia Post Note  Patient: Alexis Edwards  Procedure(s) Performed: Procedure(s) (LRB): REVERSE LEFT SHOULDER ARTHROPLASTY (Left)  Patient location during evaluation: PACU Anesthesia Type: General Level of consciousness: awake Pain management: pain level controlled Vital Signs Assessment: post-procedure vital signs reviewed and stable Cardiovascular status: stable Anesthetic complications: no       Last Vitals:  Vitals:   02/24/17 1100 02/24/17 1159  BP: (!) 118/96 105/64  Pulse: 99 (!) 101  Resp: 18 18  Temp: 36.8 C 36.4 C    Last Pain:  Vitals:   02/24/17 1247  TempSrc:   PainSc: Asleep                 CHARLENE GREEN

## 2017-02-24 NOTE — Op Note (Signed)
NAME:  LOURDEZ, MCGAHAN                 ACCOUNT NO.:  MEDICAL RECORD NO.:  [id number]  LOCATION:                                 FACILITY:  PHYSICIAN:  Doran Heater. Veverly Fells, M.D.      DATE OF BIRTH:  DATE OF PROCEDURE:  02/24/2017 DATE OF DISCHARGE:                              OPERATIVE REPORT   PREOPERATIVE DIAGNOSIS:  Left shoulder rotator cuff tear arthropathy.  POSTOPERATIVE DIAGNOSIS:  Left shoulder rotator cuff tear arthropathy.  PROCEDURE PERFORMED:  Left shoulder reverse total shoulder arthroplasty using DePuy Delta Xtend prosthesis.  ATTENDING SURGEON:  Doran Heater. Veverly Fells, MD.  ASSISTANT:  Ventura Bruns, [facility], who scrubbed the entire procedure and necessary for satisfactory completion of surgery.  ANESTHESIA:  General anesthesia was used plus interscalene block.  ESTIMATED BLOOD LOSS:  250 mL.  FLUID REPLACEMENT:  1500 mL crystalloid.  INSTRUMENT COUNTS:  Correct.  COMPLICATIONS:  There were no complications.  ANTIBIOTICS:  Perioperative antibiotics were given.  INDICATIONS:  The patient is a 71 year old female with worsening shoulder pain and left-sided dysfunction secondary to rotator cuff tear arthropathy.  The patient has had progressive pain and declining function despite conservative management including modification in activity, injections, and anti-inflammatory medications, pain medications.  The patient presents for reverse total shoulder arthroplasty to restore function and eliminate pain of the shoulder. Informed consent obtained.  DESCRIPTION OF PROCEDURE:  After an adequate level of anesthesia achieved, the patient was positioned in the modified beach-chair position, left shoulder correctly identified, sterilely prepped and draped in usual manner.  Time-out called.  We then initiated surgery using standard deltopectoral approach, starting at the coracoid process extending down to the anterior humerus.  Dissection down through subcutaneous tissues  using Bovie, identified cephalic vein, took it laterally to the deltoid, pectoralis taken medially.  Conjoint tendon was identified and retracted medially.  The biceps was tenodesed in situ using the 0 Vicryl figure-of-eight suture incorporating the pectoralis tendon.  At this time, we went ahead and released the subscapularis.  It was in poor condition, not amenable to repair.  We placed retraction sutures and #2 FiberWire for retraction of the subscapularis remnant to protect the axillary nerve.  We then went ahead and released the inferior humeral capsule at this point, we extended the shoulder, released the biceps tendon.  The remaining rotator cuff superiorly back to the teres minor, which we preserved.  We then entered the proximal humerus with a 6 mm reamer reaming up to a size 12.  We then went ahead and placed a 12 mm intramedullary resection guide and resected our head 10 degrees in retroversion.  Once we had that, we removed osteophytes with a rongeur.  Did our metaphyseal preparation for the size 1 left that was the epi-1 left.  Once we had that reamed, we inserted our trial prosthesis, which she seated fully and was in good position.  We then removed the trial for adequate exposure on the glenoid side, retracted the humerus posteriorly and then did a 360-degree glenoid exposure.  We removed the glenoid capsule and labrum in its entirety, protecting the axillary nerve.  Once we were able to get good visualization,  we removed the remaining cartilage, which was not much with a Cobb elevator.  We placed a central guide pin and then milled, reamed for the base plate. We then drilled our central PEG hole, impacted the base plate in position, secured with a 48 screw locked inferiorly, 36 locked to the base of the coracoid, and 18 nonlocked anteriorly, only 3 screws were used.  Good base plate fixation achieved.  A 38 standard glenosphere was then impacted and screwed into position.   Once that was in place, we double checked to make sure the axillary nerve was free and clear.  We then refocused our attention to the humerus.  We irrigated thoroughly and dried the humeral canal and then used a hybrid fixation technique with a press-fit stem HA coated size 10 stem with an epi-1 left set on the 0 setting and placed in 10 degrees of retroversion.  We placed a small amount of cement in the metaphyseal portion and then pushed the stem down in position and impacted that where it was stable and the appropriate 10 degrees of retroversion.  We then went ahead and trialed. The stem was quite stable with the fins and the distal purchase of the stem, so we went ahead and trialed.  The +3 trial was definitely too loose.  We went ahead, went up to the +9, 38+ 9 poly and impacted that position, reduced the shoulder and had nice secure fixation.  No gapping with external rotation.  Had to pull on it quite a bit to get any gapping within inferior pole and no soft tissue impingement.  We irrigated thoroughly and closed the deltopectoral interval with 0 Vicryl suture.  We did resect the remaining portion of the subscap and then closed deltopectoral with 0 Vicryl suture, followed by 2-0 Vicryl for subcutaneous closure and 4-0 Monocryl for skin.  Steri-Strips applied, followed by sterile dressing.  The patient tolerated surgery well.     Doran Heater. Veverly Fells, M.D.     SRN/MEDQ  D:  02/24/2017  T:  02/24/2017  Job:  [id number]

## 2017-02-25 LAB — BASIC METABOLIC PANEL
Anion gap: 9 (ref 5–15)
BUN: 8 mg/dL (ref 6–20)
CO2: 30 mmol/L (ref 22–32)
Calcium: 8.9 mg/dL (ref 8.9–10.3)
Chloride: 98 mmol/L — ABNORMAL LOW (ref 101–111)
Creatinine, Ser: 1.01 mg/dL — ABNORMAL HIGH (ref 0.44–1.00)
GFR calc Af Amer: >60 mL/min (ref >60)
GFR calc non Af Amer: 55 mL/min — ABNORMAL LOW (ref >60)
Glucose, Bld: 142 mg/dL — ABNORMAL HIGH (ref 65–99)
Potassium: 4.4 mmol/L (ref 3.5–5.1)
Sodium: 137 mmol/L (ref 135–145)

## 2017-02-25 LAB — HEMOGLOBIN AND HEMATOCRIT, BLOOD
HCT: 44.9 % (ref 36.0–46.0)
Hemoglobin: 14.8 g/dL (ref 12.0–15.0)

## 2017-02-25 NOTE — Progress Notes (Signed)
Patient ID: Alexis Edwards, female   DOB: 12/31/1945, 71 y.o.   MRN: [id number] Subjective: 1 Day Post-Op Procedure(s) (LRB): REVERSE LEFT SHOULDER ARTHROPLASTY (Left)    Patient reports pain as moderate.  Difficulty with mobility this am  Objective:   VITALS:   Vitals:   02/25/17 0000 02/25/17 0523  BP: (!) 115/57 135/80  Pulse: 96 96  Resp: 18 18  Temp: 98.5 F (36.9 C) 98.7 F (37.1 C)    Neurovascular intact Incision: dressing C/D/I  LABS No results for input(s): HGB, HCT, WBC, PLT in the last 72 hours.  No results for input(s): NA, K, BUN, CREATININE, GLUCOSE in the last 72 hours.  No results for input(s): LABPT, INR in the last 72 hours.   Assessment/Plan: 1 Day Post-Op Procedure(s) (LRB): REVERSE LEFT SHOULDER ARTHROPLASTY (Left)   Advance diet Up with therapy   Due to mobility issues and pain will plan for her to stay today for therapy and plan for discharge to home tomorrow Dressing change tomorrow prior to discharge

## 2017-02-25 NOTE — Evaluation (Signed)
Occupational Therapy Evaluation Patient Details Name: Alexis Edwards MRN: [id number] DOB: August 04, 1946 Today's Date: 02/25/2017    History of Present Illness Pt is a 71 y.o. female s/p L reverse total shoulder arthroplasty. Pt has a PMH significant for CHF, depression, dislipidemia, HTN, hyperlipidemia, obesity, sleep apnea, and Takotsubo syndrome.   Clinical Impression   On arrival, pt in process of returning to bed with assist from husband. Prior to increase in shoulder pain, pt was independent with ADL and functional mobility; however, recently husband has been assisting at times. Pt currently requires mod assist for UB ADL and max assist for LB ADL. Additionally, she demonstrates decreased balance impacting ability to complete toilet transfers with mod assist. Educated pt and husband on conservative shoulder protocol including dressing/bathing techniques as well as elbow/wrist/hand AROM HEP. Pt would benefit from continued OT services to improve independence with ADL and functional mobility in preparation for return home with 24 hour assistance from husband. Recommend progression of rehab of shoulder per MD. OT will continue to follow acutely.    Follow Up Recommendations  Supervision/Assistance - 24 hour (Progress rehab of shoulder per MD)    Equipment Recommendations  3 in 1 bedside commode    Recommendations for Other Services       Precautions / Restrictions Precautions Precautions: Shoulder Type of Shoulder Precautions: Conservative protocol: sling except for ADL/exercise, AROM elbow/wrist/hand, NWB L UE, and per MD order "OK for gentle ADL" Shoulder Interventions: Shoulder sling/immobilizer;Off for dressing/bathing/exercises Precaution Booklet Issued: Yes (comment) Precaution Comments: Reviewed handout and precautions in full Required Braces or Orthoses: Sling (L shoulder) Restrictions Weight Bearing Restrictions: Yes LUE Weight Bearing: Non weight bearing      Mobility  Bed Mobility Overal bed mobility: Needs Assistance Bed Mobility: Rolling;Sidelying to Sit;Sit to Sidelying Rolling: Mod assist Sidelying to sit: Mod assist     Sit to sidelying: Mod assist General bed mobility comments: Mod assist to manage B LE's and raise trunk.  Transfers Overall transfer level: Needs assistance Equipment used: 1 person hand held assist Transfers: Sit to/from Stand Sit to Stand: Min assist         General transfer comment: Min assist to rise to standing.    Balance Overall balance assessment: Needs assistance Sitting-balance support: No upper extremity supported;Feet supported Sitting balance-Leahy Scale: Good     Standing balance support: No upper extremity supported;Single extremity supported;During functional activity Standing balance-Leahy Scale: Fair Standing balance comment: Able to statically stand without UE support but reliant on UE support for mobility.                           ADL either performed or assessed with clinical judgement   ADL Overall ADL's : Needs assistance/impaired Eating/Feeding: Set up;Sitting   Grooming: Set up;Sitting   Upper Body Bathing: Moderate assistance;Sitting   Lower Body Bathing: Maximal assistance;Sit to/from stand   Upper Body Dressing : Moderate assistance;Sitting   Lower Body Dressing: Maximal assistance;Sit to/from stand   Toilet Transfer: BSC;Stand-pivot;Moderate assistance   Toileting- Clothing Manipulation and Hygiene: Minimal assistance;Sit to/from stand       Functional mobility during ADLs: Moderate assistance General ADL Comments: Pt educated on bathing and dressing techniques as well as shoulder precautions during ADL per MD order. Pt demonstrates decreased balance in standing and with mobility and plan to assess with cane next visit.     Vision Patient Visual Report: No change from baseline Vision Assessment?: No apparent visual deficits  Perception     Praxis       Pertinent Vitals/Pain Pain Assessment: 0-10 Pain Score: 7  Pain Location: L shoulder Pain Descriptors / Indicators: Aching;Constant;Operative site guarding Pain Intervention(s): Monitored during session;Limited activity within patient's tolerance;Repositioned;Ice applied     Hand Dominance     Extremity/Trunk Assessment Upper Extremity Assessment Upper Extremity Assessment: LUE deficits/detail LUE Deficits / Details: Decreased strength and ROM as expected post-operatively. LUE: Unable to fully assess due to immobilization;Unable to fully assess due to pain   Lower Extremity Assessment Lower Extremity Assessment: Generalized weakness       Communication Communication Communication: No difficulties   Cognition Arousal/Alertness: Lethargic;Suspect due to medications Behavior During Therapy: [facility] for tasks assessed/performed Overall Cognitive Status: Within Functional Limits for tasks assessed                                     General Comments       Exercises Exercises: Shoulder Shoulder Exercises Elbow Flexion: AROM;Left;10 reps;Standing Wrist Flexion: AROM;Left;10 reps;Seated Wrist Extension: AROM;Left;10 reps;Seated Digit Composite Flexion: AROM;Left;10 reps;Seated Composite Extension: AROM;Left;10 reps;Seated   Shoulder Instructions Shoulder Instructions Donning/doffing shirt without moving shoulder: Moderate assistance Method for sponge bathing under operated UE: Moderate assistance Donning/doffing sling/immobilizer: Maximal assistance Correct positioning of sling/immobilizer: Minimal assistance ROM for elbow, wrist and digits of operated UE: Min-guard Sling wearing schedule (on at all times/off for ADL's): Supervision/safety Proper positioning of operated UE when showering: Supervision/safety Positioning of UE while sleeping: Minimal assistance    Home Living Family/patient expects to be discharged to:: Private residence Living Arrangements:  Spouse/significant other Available Help at Discharge: Family;Available 24 hours/day Type of Home: House Home Access: Stairs to enter [employer] of Steps: 4   Home Layout: One level     Bathroom Shower/Tub: Walk-in shower         Home Equipment: Environmental consultant - 2 wheels;Cane - single point;Shower seat          Prior Functioning/Environment Level of Independence: Independent        Comments: Independent until shoulder pain increased so much that husband assisting with some ADL.        OT Problem List: Decreased strength;Decreased range of motion;Decreased activity tolerance;Impaired balance (sitting and/or standing);Decreased safety awareness;Decreased knowledge of use of DME or AE;Decreased knowledge of precautions;Impaired UE functional use;Pain      OT Treatment/Interventions: Self-care/ADL training;Therapeutic exercise;Energy conservation;DME and/or AE instruction;Therapeutic activities;Patient/family education;Balance training    OT Goals(Current goals can be found in the care plan section) Acute Rehab OT Goals Patient Stated Goal: to have less pain OT Goal Formulation: With patient/family Time For Goal Achievement: 03/11/17 Potential to Achieve Goals: Good ADL Goals Pt Will Perform Upper Body Dressing: with min assist;with caregiver independent in assisting;sitting Pt Will Perform Lower Body Dressing: with min assist;with caregiver independent in assisting;sit to/from stand Pt Will Transfer to Toilet: with supervision;ambulating;bedside commode Pt Will Perform Toileting - Clothing Manipulation and hygiene: with supervision;sit to/from stand Pt Will Perform Tub/Shower Transfer: Shower transfer;with min guard assist;ambulating;shower seat Pt/caregiver will Perform Home Exercise Program: Left upper extremity;With written HEP provided (elbow/wrist/hand AROM) Additional ADL Goal #1: Pt will complete bed mobility in preparation for ADL/HEP seated at EOB with  supervision for safety.  OT Frequency: Min 2X/week   Barriers to D/C:            Co-evaluation  AM-PAC PT "6 Clicks" Daily Activity     Outcome Measure Help from another person eating meals?: None Help from another person taking care of personal grooming?: None Help from another person toileting, which includes using toliet, bedpan, or urinal?: A Lot Help from another person bathing (including washing, rinsing, drying)?: A Lot Help from another person to put on and taking off regular upper body clothing?: A Lot Help from another person to put on and taking off regular lower body clothing?: A Lot 6 Click Score: 16   End of Session Equipment Utilized During Treatment: Gait belt (L shoulder sling) Nurse Communication: Mobility status;Patient requests pain meds  Activity Tolerance: Patient tolerated treatment well Patient left: in bed;with call bell/phone within reach;with family/visitor present  OT Visit Diagnosis: Pain Pain - Right/Left: Left Pain - part of body: Shoulder                Time: 0165-5374 OT Time Calculation (min): 36 min Charges:  OT General Charges $OT Visit: 1 Procedure OT Evaluation $OT Eval Moderate Complexity: 1 Procedure OT Treatments $Self Care/Home Management : 8-22 mins G-Codes:     Norman Herrlich, MS OTR/L  Pager: [place] A Aquarius Latouche 02/25/2017, 9:37 AM

## 2017-02-26 ENCOUNTER — Inpatient Hospital Stay (HOSPITAL_COMMUNITY): Payer: Medicare Other

## 2017-02-26 MED ORDER — OXYCODONE HCL 5 MG PO TABS
10.0000 mg | ORAL_TABLET | Freq: Four times a day (QID) | ORAL | Status: DC
  Administered 2017-02-27 (×2): 10 mg via ORAL
  Filled 2017-02-26 (×2): qty 2

## 2017-02-26 NOTE — Progress Notes (Signed)
   Subjective:  Patient reports pain as mild.  No complaints of chills or NS.  Denies CP or SOB.  Has been on supplemental O2 here, but denies needing it at home.  Objective:   VITALS:   Vitals:   02/25/17 2046 02/26/17 0455 02/26/17 0600 02/26/17 0742  BP: (!) 108/50 (!) 135/95    Pulse: 98 80  (!) 112  Resp: 18 18    Temp: 98.4 F (36.9 C) (!) 101.2 F (38.4 C) 100.2 F (37.9 C) 99 F (37.2 C)  TempSrc: Oral Oral Oral   SpO2: 98% 90%  90%  Weight:        Neurovascular intact Sensation intact distally Intact pulses distally Incision: dressing C/D/I and changed to Aquacel Sling in place  Lab Results  Component Value Date   WBC 9.8 02/15/2017   HGB 14.8 02/25/2017   HCT 44.9 02/25/2017   MCV 98.7 02/15/2017   PLT 262 02/15/2017   BMET    Component Value Date/Time   NA 137 02/25/2017 0755   K 4.4 02/25/2017 0755   CL 98 (L) 02/25/2017 0755   CO2 30 02/25/2017 0755   GLUCOSE 142 (H) 02/25/2017 0755   BUN 8 02/25/2017 0755   CREATININE 1.01 (H) 02/25/2017 0755   CREATININE 1.13 (H) 06/21/2013 0927   CALCIUM 8.9 02/25/2017 0755   GFRNONAA 55 (L) 02/25/2017 0755   GFRAA >60 02/25/2017 0755     Assessment/Plan: 2 Days Post-Op   Active Problems:   S/P shoulder replacement, left   Would dc supplemental O2 and encourage pulm toilet with goal of dc home today Afebrile on recent vitals, spike likely related to atelectasis post op NWB LUE, sling, dressing changed today, and should remain in place. Dc home today if able and stable from pulm standpoint    Nicholes Stairs 02/26/2017, 9:27 AM   Geralynn Rile, MD [phone]

## 2017-02-26 NOTE — Evaluation (Signed)
Physical Therapy Evaluation Patient Details Name: Alexis Edwards MRN: [id number] DOB: 22-Mar-1946 Today's Date: 02/26/2017   History of Present Illness  Pt is a 71 y.o. female s/p L reverse total shoulder arthroplasty. Pt has a PMH significant for CHF, depression, dislipidemia, HTN, hyperlipidemia, obesity, sleep apnea, and Takotsubo syndrome.  Clinical Impression   Patient is s/p above surgery resulting in functional limitations due to the deficits listed below (see PT Problem List). Presents with functional dependencies; We discussed possibility of SNF for post-acute rehab, and pt and husband very much indicated they would rather dc home; I emphasized that at this functional level she MUST have husband near/holding her WHENEVER she is standing or making transitional movements;  Patient will benefit from skilled PT to increase their independence and safety with mobility to allow discharge to the venue listed below.    Please see also previous PT note re: supplemental O2 requirements with ambulation       Home health PT;Supervision/Assistance - 24 hour;Other (comment) (as well as HHOT)    Equipment Recommendations   (Oxygen)    Recommendations for Other Services       Precautions / Restrictions Precautions Precautions: Shoulder;Fall Type of Shoulder Precautions: Conservative protocol: sling except for ADL/exercise, AROM elbow/wrist/hand, NWB L UE, and per MD order "OK for gentle ADL" Shoulder Interventions: Shoulder sling/immobilizer;Off for dressing/bathing/exercises Precaution Booklet Issued: Yes (comment) Precaution Comments: Reviewed handout and precautions in full Required Braces or Orthoses: Sling Restrictions Weight Bearing Restrictions: Yes LUE Weight Bearing: Non weight bearing      Mobility  Bed Mobility               General bed mobility comments: OOB in chair on arrival  Transfers Overall transfer level: Needs assistance Equipment used: Straight cane;1  person hand held assist Transfers: Sit to/from Stand Sit to Stand: Mod assist         General transfer comment: Heavy mod assist for rise to standing.  Ambulation/Gait Ambulation/Gait assistance: Min assist Ambulation Distance (Feet): 25 Feet (x2) Assistive device: Straight cane Gait Pattern/deviations: Step-through pattern;Decreased step length - right;Decreased step length - left;Decreased stride length   Gait velocity interpretation: Below normal speed for age/gender General Gait Details: Cues to self-monitor for activity tolerance; min assist for balance during amb  Stairs            Wheelchair Mobility    Modified Rankin (Stroke Patients Only)       Balance Overall balance assessment: Needs assistance Sitting-balance support: Feet supported;Single extremity supported Sitting balance-Leahy Scale: Fair     Standing balance support: During functional activity;Single extremity supported Standing balance-Leahy Scale: Poor Standing balance comment: Relies on single UE support for standing balance at all times.                             Pertinent Vitals/Pain Pain Assessment: Faces Faces Pain Scale: Hurts little more Pain Location: L shoulder Pain Descriptors / Indicators: Aching;Constant;Operative site guarding Pain Intervention(s): Monitored during session    Home Living Family/patient expects to be discharged to:: Private residence Living Arrangements: Spouse/significant other Available Help at Discharge: Family;Available 24 hours/day Type of Home: House Home Access: Stairs to enter Entrance Stairs-Rails: Psychiatric nurse of Steps: 4 Home Layout: One level Home Equipment: Walker - 2 wheels;Cane - single point;Shower seat      Prior Function Level of Independence: Independent         Comments: Independent until shoulder pain  increased so much that husband assisting with some ADL.     Hand Dominance         Extremity/Trunk Assessment   Upper Extremity Assessment Upper Extremity Assessment: Defer to OT evaluation    Lower Extremity Assessment Lower Extremity Assessment: Generalized weakness    Cervical / Trunk Assessment Cervical / Trunk Assessment: Other exceptions Cervical / Trunk Exceptions: trunk mobility limited by body habitus  Communication   Communication: No difficulties  Cognition Arousal/Alertness: Awake/alert Behavior During Therapy: WFL for tasks assessed/performed Overall Cognitive Status: Within Functional Limits for tasks assessed                                 General Comments: Increased processing time due to lethargy.      General Comments      Exercises Shoulder Exercises Elbow Flexion: AROM;Left;10 reps;Standing Wrist Flexion: AROM;Left;10 reps;Seated Wrist Extension: AROM;Left;10 reps;Seated Digit Composite Flexion: AROM;Left;10 reps;Seated Composite Extension: AROM;Left;10 reps;Seated Donning/doffing shirt without moving shoulder: Maximal assistance Method for sponge bathing under operated UE: Moderate assistance Donning/doffing sling/immobilizer: Maximal assistance Correct positioning of sling/immobilizer: Moderate assistance ROM for elbow, wrist and digits of operated UE: Minimal assistance Sling wearing schedule (on at all times/off for ADL's): Supervision/safety Proper positioning of operated UE when showering: Supervision/safety Positioning of UE while sleeping: Minimal assistance   Assessment/Plan    PT Assessment Patient needs continued PT services  PT Problem List Decreased strength;Decreased range of motion;Decreased activity tolerance;Decreased balance;Decreased mobility;Decreased coordination;Decreased knowledge of use of DME;Decreased safety awareness;Decreased knowledge of precautions;Pain;Cardiopulmonary status limiting activity       PT Treatment Interventions DME instruction;Gait training;Stair training;Functional  mobility training;Therapeutic activities;Therapeutic exercise;Balance training;Patient/family education    PT Goals (Current goals can be found in the Care Plan section)  Acute Rehab PT Goals Patient Stated Goal: to have less pain PT Goal Formulation: With patient Time For Goal Achievement: 03/05/17 Potential to Achieve Goals: Good    Frequency Min 4X/week   Barriers to discharge        Co-evaluation               AM-PAC PT "6 Clicks" Daily Activity  Outcome Measure Difficulty turning over in bed (including adjusting bedclothes, sheets and blankets)?: Total Difficulty moving from lying on back to sitting on the side of the bed? : Total Difficulty sitting down on and standing up from a chair with arms (e.g., wheelchair, bedside commode, etc,.)?: Total Help needed moving to and from a bed to chair (including a wheelchair)?: A Lot Help needed walking in hospital room?: A Lot Help needed climbing 3-5 steps with a railing? : A Lot 6 Click Score: 9    End of Session Equipment Utilized During Treatment: Gait belt;Oxygen Activity Tolerance: Patient tolerated treatment well Patient left: in chair;with call bell/phone within reach;with family/visitor present Nurse Communication: Mobility status PT Visit Diagnosis: Unsteadiness on feet (R26.81);Other abnormalities of gait and mobility (R26.89);Pain Pain - Right/Left: Left Pain - part of body: Shoulder    Time: 8469-6295 PT Time Calculation (min) (ACUTE ONLY): 28 min   Charges:   PT Evaluation $PT Eval Moderate Complexity: 1 Procedure PT Treatments $Gait Training: 8-22 mins   PT G Codes:        Roney Marion, PT  Acute Rehabilitation Services Pager [phone] Office [place] 02/26/2017, 2:43 PM

## 2017-02-26 NOTE — Progress Notes (Signed)
PT consult recommended by OT this morning after patient showing increased difficulty with function and mobility. PT evaluated patient and recommend home health PT in addition to already recommended OT and home equipment. PT also performed a home O2 oxygen needs evaluation and noted that patient was 87% on room air while ambulating, and only achieved normal oxygenation above 92% while on 3LPM oxygen. Paged covering physician for [place] who stated it was OK for patient to stay an additional night to set up home O2 and HHPT/OT and obtain DME.

## 2017-02-26 NOTE — Progress Notes (Signed)
Received in report this morning that patient had run a temp of 101.2 which dropped to 100.2 after removing excess blankets from patient and helping her wash up. It was also noted that the patient dropped her oxygen saturation on room air down to 86% and she needed to be put back on 3 Lpm oxygen. Paged Dr. Veverly Fells via the answering service, awaiting call back, will place patient back on continuous pulse ox monitoring and encourage incentive spirometer use.

## 2017-02-26 NOTE — Progress Notes (Addendum)
Occupational Therapy Treatment Patient Details Name: Alexis Edwards MRN: [id number] DOB: 08/02/1946 Today's Date: 02/26/2017    History of present illness Pt is a 71 y.o. female s/p L reverse total shoulder arthroplasty. Pt has a PMH significant for CHF, depression, dislipidemia, HTN, hyperlipidemia, obesity, sleep apnea, and Takotsubo syndrome.   OT comments  Pt demonstrating increased difficulty with functional mobility and ADL this session requiring mod assist for ambulating toilet transfer, min assist for standing grooming tasks, and max assist for UB ADL. Pt with increased lethargy likely due to medications this session. Additionally limited by O2 desaturation on 3L O2 (see below for details). Recommend PT consult for evaluation of mobility. Discussed current functional status with pt and husband and they are in agreement that pt would benefit from further therapy services post-acute D/C. Updated D/C recommendations for home health OT services in order to maximize independence with ADL and functional mobility. Will continue to follow.   Follow Up Recommendations  Supervision/Assistance - 24 hour;Home health OT  Equipment Recommendations  3 in 1 bedside commode    Recommendations for Other Services PT consult    Precautions / Restrictions Precautions Precautions: Shoulder Type of Shoulder Precautions: Conservative protocol: sling except for ADL/exercise, AROM elbow/wrist/hand, NWB L UE, and per MD order "OK for gentle ADL" Shoulder Interventions: Shoulder sling/immobilizer;Off for dressing/bathing/exercises Precaution Booklet Issued: Yes (comment) Precaution Comments: Reviewed handout and precautions in full Required Braces or Orthoses: Sling (L shoulder) Restrictions Weight Bearing Restrictions: Yes LUE Weight Bearing: Non weight bearing       Mobility Bed Mobility               General bed mobility comments: OOB in chair on arrival  Transfers Overall transfer  level: Needs assistance Equipment used: Straight cane;1 person hand held assist Transfers: Sit to/from Stand Sit to Stand: Mod assist         General transfer comment: Heavy mod assist for rise to standing.    Balance Overall balance assessment: Needs assistance Sitting-balance support: Feet supported;Single extremity supported Sitting balance-Leahy Scale: Fair     Standing balance support: During functional activity;Single extremity supported Standing balance-Leahy Scale: Poor Standing balance comment: Relies on single UE support for standing balance at all times.                           ADL either performed or assessed with clinical judgement   ADL Overall ADL's : Needs assistance/impaired     Grooming: Moderate assistance;Standing           Upper Body Dressing : Maximal assistance;Sitting Upper Body Dressing Details (indicate cue type and reason): max assist for sling     Toilet Transfer: Moderate assistance;Ambulation;BSC (cane) Toilet Transfer Details (indicate cue type and reason): Mod assist for mobility this session. Toileting- Clothing Manipulation and Hygiene: Moderate assistance;Sit to/from stand       Functional mobility during ADLs: Moderate assistance;Cane General ADL Comments: Pt with decline in functional mobility independence this session requiring increased assistance for toilet transfers and UB dressing. Discussed with pt and family and husband reports concern for her safety post-acute D/C if only he is available to assist. Pt with O2 desaturation on 3LO2 at rest (ranging from 87-92%) but able to maintain 92-93% during ambulation to bathroom.     Vision   Vision Assessment?: No apparent visual deficits   Perception     Praxis      Cognition Arousal/Alertness: Lethargic;Suspect due to medications  Behavior During Therapy: WFL for tasks assessed/performed Overall Cognitive Status: Within Functional Limits for tasks assessed                                  General Comments: Increased processing time due to lethargy.        Exercises Exercises: Shoulder Shoulder Exercises Elbow Flexion: AROM;Left;10 reps;Standing Wrist Flexion: AROM;Left;10 reps;Seated Wrist Extension: AROM;Left;10 reps;Seated Digit Composite Flexion: AROM;Left;10 reps;Seated Composite Extension: AROM;Left;10 reps;Seated   Shoulder Instructions Shoulder Instructions Donning/doffing shirt without moving shoulder: Maximal assistance Method for sponge bathing under operated UE: Moderate assistance Donning/doffing sling/immobilizer: Maximal assistance Correct positioning of sling/immobilizer: Moderate assistance ROM for elbow, wrist and digits of operated UE: Minimal assistance Sling wearing schedule (on at all times/off for ADL's): Supervision/safety Proper positioning of operated UE when showering: Supervision/safety Positioning of UE while sleeping: Minimal assistance     General Comments      Pertinent Vitals/ Pain       Pain Assessment: Faces Faces Pain Scale: Hurts little more Pain Location: L shoulder Pain Descriptors / Indicators: Aching;Constant;Operative site guarding Pain Intervention(s): Limited activity within patient's tolerance;Monitored during session;Repositioned  Home Living                                          Prior Functioning/Environment              Frequency  Min 2X/week        Progress Toward Goals  OT Goals(current goals can now be found in the care plan section)  Progress towards OT goals: Not progressing toward goals - comment (decline in mobility this session)  Acute Rehab OT Goals Patient Stated Goal: to have less pain OT Goal Formulation: With patient/family Time For Goal Achievement: 03/11/17 Potential to Achieve Goals: Good ADL Goals Pt Will Perform Upper Body Dressing: with min assist;with caregiver independent in assisting;sitting Pt Will Perform  Lower Body Dressing: with min assist;with caregiver independent in assisting;sit to/from stand Pt Will Transfer to Toilet: with supervision;ambulating;bedside commode Pt Will Perform Toileting - Clothing Manipulation and hygiene: with supervision;sit to/from stand Pt Will Perform Tub/Shower Transfer: Shower transfer;with min guard assist;ambulating;shower seat Pt/caregiver will Perform Home Exercise Program: Left upper extremity;With written HEP provided (elbow/wrist/hand AROM) Additional ADL Goal #1: Pt will complete bed mobility in preparation for ADL/HEP seated at EOB with supervision for safety.  Plan Discharge plan needs to be updated    Co-evaluation                 AM-PAC PT "6 Clicks" Daily Activity     Outcome Measure   Help from another person eating meals?: None Help from another person taking care of personal grooming?: A Lot Help from another person toileting, which includes using toliet, bedpan, or urinal?: A Lot Help from another person bathing (including washing, rinsing, drying)?: A Lot Help from another person to put on and taking off regular upper body clothing?: A Lot Help from another person to put on and taking off regular lower body clothing?: A Lot 6 Click Score: 14    End of Session Equipment Utilized During Treatment: Gait belt;Oxygen (L shoulder sling; 3LO2)  OT Visit Diagnosis: Pain;Other abnormalities of gait and mobility (R26.89) Pain - Right/Left: Left Pain - part of body: Shoulder   Activity Tolerance Patient tolerated treatment well  Patient Left in bed;with call bell/phone within reach;with family/visitor present   Nurse Communication Mobility status (PT consult recommended)        Time: 7639-4320 OT Time Calculation (min): 41 min  Charges: OT General Charges $OT Visit: 1 Procedure OT Treatments $Self Care/Home Management : 23-37 mins $Therapeutic Exercise: 8-22 mins  Norman Herrlich, MS OTR/L  Pager: [place] 02/26/2017, 2:05 PM

## 2017-02-26 NOTE — Progress Notes (Signed)
Physical Therapy Note  (Full PT eval note to follow)  SATURATION QUALIFICATIONS: (This note is used to comply with regulatory documentation for home oxygen)  Patient Saturations on Room Air at Rest = 90%  Patient Saturations on Room Air while Ambulating = 87%  Patient Saturations on 3 Liters of oxygen while Ambulating = 92-94%  Please briefly explain why patient needs home oxygen: Patient requires supplemental oxygen to maintain oxygen saturations at acceptable, safe levels with physical activity.  Roney Marion, [place]  Acute Rehabilitation Services Pager [phone] Office [phone]

## 2017-02-27 ENCOUNTER — Encounter (HOSPITAL_COMMUNITY): Payer: Self-pay | Admitting: Orthopedic Surgery

## 2017-02-27 NOTE — Progress Notes (Signed)
Occupational Therapy Treatment Patient Details Name: Alexis Edwards MRN: [id number] DOB: [birth year] Today's Date: 02/27/2017    History of present illness Pt is a 71 y.o. female s/p L reverse total shoulder arthroplasty. Pt has a PMH significant for CHF, depression, dislipidemia, HTN, hyperlipidemia, obesity, sleep apnea, and Takotsubo syndrome.   OT comments  Pt progressing well toward OT goals this session. Able to complete toilet transfers with min handheld assist and complete UB dressing with mod assist. Able to demonstrate technique for UB bathing with min assist this session and verbalizes understanding of need for assistance 24 hours/day at home. Continue to recommend home health OT post-acute D/C in order to maximize return to PLOF and safety at home. Will continue to follow acutely.   Follow Up Recommendations  Supervision/Assistance - 24 hour;Home health OT    Equipment Recommendations  3 in 1 bedside commode    Recommendations for Other Services PT consult    Precautions / Restrictions Precautions Precautions: Shoulder;Fall Type of Shoulder Precautions: Conservative protocol: sling except for ADL/exercise, AROM elbow/wrist/hand, NWB L UE, and per MD order "OK for gentle ADL" Shoulder Interventions: Shoulder sling/immobilizer;Off for dressing/bathing/exercises Precaution Booklet Issued: Yes (comment) Precaution Comments: Reviewed handout and precautions in full Required Braces or Orthoses: Sling Restrictions Weight Bearing Restrictions: Yes LUE Weight Bearing: Non weight bearing       Mobility Bed Mobility Overal bed mobility: Needs Assistance Bed Mobility: Rolling;Sidelying to Sit;Sit to Sidelying Rolling: Mod assist Sidelying to sit: Mod assist          Transfers Overall transfer level: Needs assistance Equipment used: Straight cane;1 person hand held assist Transfers: Sit to/from Stand Sit to Stand: Mod assist         General transfer comment:  Heavy mod assist for rise to standing.    Balance Overall balance assessment: Needs assistance Sitting-balance support: Feet supported;Single extremity supported Sitting balance-Leahy Scale: Good     Standing balance support: During functional activity;Single extremity supported Standing balance-Leahy Scale: Poor Standing balance comment: Relies on single UE support for standing balance at all times.                           ADL either performed or assessed with clinical judgement   ADL Overall ADL's : Needs assistance/impaired Eating/Feeding: Set up;Sitting       Upper Body Bathing: Minimal assistance;Sitting       Upper Body Dressing : Moderate assistance;Sitting       Toilet Transfer: Minimal assistance;Ambulation;BSC (BSC over toilet)   Toileting- Clothing Manipulation and Hygiene: Maximal assistance;Sit to/from stand Toileting - Clothing Manipulation Details (indicate cue type and reason): Reports husband will cover this     Functional mobility during ADLs: Minimal assistance (1 person handheld assist) General ADL Comments: Pt more alert this session. On 4L O2 on OT arrival with SpO2 fluctuating between 88-95% on room monitor. On RA at rest, able to maintain SpO2 93%. During seated ADL able to sustain SpO2 93%. On ambulation to bathroom and with standing ADL desaturated to 88% on RA and rebounded to 93% when seated. Returned 4L O2 at end of session and able to sustain 95% SpO2.      Vision   Vision Assessment?: No apparent visual deficits   Perception     Praxis      Cognition Arousal/Alertness: Awake/alert Behavior During Therapy: WFL for tasks assessed/performed Overall Cognitive Status: Within Functional Limits for tasks assessed  General Comments: Much more alert this session.        Exercises Exercises: Shoulder Shoulder Exercises Elbow Flexion: AROM;Left;10 reps;Seated Wrist Flexion:  AROM;Left;10 reps;Seated Wrist Extension: AROM;Left;10 reps;Seated Digit Composite Flexion: AROM;Left;10 reps;Seated Composite Extension: AROM;Left;10 reps;Seated   Shoulder Instructions Shoulder Instructions Donning/doffing shirt without moving shoulder: Moderate assistance Method for sponge bathing under operated UE: Minimal assistance Donning/doffing sling/immobilizer: Moderate assistance Correct positioning of sling/immobilizer: Supervision/safety ROM for elbow, wrist and digits of operated UE: Supervision/safety Sling wearing schedule (on at all times/off for ADL's): Supervision/safety Proper positioning of operated UE when showering: Supervision/safety Positioning of UE while sleeping: Minimal assistance     General Comments      Pertinent Vitals/ Pain       Pain Assessment: Faces Faces Pain Scale: Hurts a little bit Pain Location: L shoulder Pain Descriptors / Indicators: Aching;Constant;Operative site guarding Pain Intervention(s): Monitored during session  Home Living                                          Prior Functioning/Environment              Frequency  Min 2X/week        Progress Toward Goals  OT Goals(current goals can now be found in the care plan section)  Progress towards OT goals: Progressing toward goals  Acute Rehab OT Goals Patient Stated Goal: to have less pain OT Goal Formulation: With patient/family Time For Goal Achievement: 03/11/17 Potential to Achieve Goals: Good ADL Goals Pt Will Perform Upper Body Dressing: with min assist;with caregiver independent in assisting;sitting Pt Will Perform Lower Body Dressing: with min assist;with caregiver independent in assisting;sit to/from stand Pt Will Transfer to Toilet: with supervision;ambulating;bedside commode Pt Will Perform Toileting - Clothing Manipulation and hygiene: with supervision;sit to/from stand Pt Will Perform Tub/Shower Transfer: Shower transfer;with min  guard assist;ambulating;shower seat Pt/caregiver will Perform Home Exercise Program: Left upper extremity;With written HEP provided (elbow/wrist/hand AROM) Additional ADL Goal #1: Pt will complete bed mobility in preparation for ADL/HEP seated at EOB with supervision for safety.  Plan Discharge plan remains appropriate    Co-evaluation                 AM-PAC PT "6 Clicks" Daily Activity     Outcome Measure   Help from another person eating meals?: None Help from another person taking care of personal grooming?: A Little Help from another person toileting, which includes using toliet, bedpan, or urinal?: A Lot Help from another person bathing (including washing, rinsing, drying)?: A Lot Help from another person to put on and taking off regular upper body clothing?: A Lot Help from another person to put on and taking off regular lower body clothing?: A Lot 6 Click Score: 15    End of Session Equipment Utilized During Treatment: Gait belt;Oxygen (L shoulder sling; 4LO2)  OT Visit Diagnosis: Pain;Other abnormalities of gait and mobility (R26.89) Pain - Right/Left: Left Pain - part of body: Shoulder   Activity Tolerance Patient tolerated treatment well   Patient Left in bed;with call bell/phone within reach;with family/visitor present   Nurse Communication Mobility status (PT consult recommended)        Time: 6213-0865 OT Time Calculation (min): 30 min  Charges: OT General Charges $OT Visit: 1 Procedure OT Treatments $Self Care/Home Management : 8-22 mins $Therapeutic Exercise: 8-22 mins  Norman Herrlich, MS OTR/L  Pager:  [place] 02/27/2017, 9:03 AM

## 2017-02-27 NOTE — Care Management Note (Signed)
Case Management Note  Patient Details  Name: Alexis Edwards MRN: [id number] Date of Birth: [birth year]  Subjective/Objective:     71 yr old female s/p left total shoulder arthroplasty.                Action/Plan: Case manager spoke with patient and her husband concerning discharge plans. CM spoke with PA concerning therapist request for patient to have oxygen for home. At this time there is no need. Patient is saturating well. Case manager offered choice for [place]. Referral was called to Danny Lawless, [place]. Patient will have family support at discharge.   Expected Discharge Date:  02/27/17               Expected Discharge Plan:  [place]  In-House Referral:     Discharge planning Services  CM Consult  Post Acute Care Choice:  Home Health Choice offered to:  Patient, Spouse  DME Arranged:  N/A DME Agency:  NA  HH Arranged:  PT, OT HH Agency:  [facility]  Status of Service:  Completed, signed off  If discussed at [place] of Stay Meetings, dates discussed:    Additional Comments:  Ninfa Meeker, RN 02/27/2017, 9:53 AM

## 2017-02-27 NOTE — Progress Notes (Signed)
   Subjective: 3 Days Post-Op Procedure(s) (LRB): REVERSE LEFT SHOULDER ARTHROPLASTY (Left)  Pt feeling much better today Oxygenation has improved after figuring out her nose was clogged  Currently 98% on 2 liters Pain is mild Ready for d/c home Patient reports pain as mild.  Objective:   VITALS:   Vitals:   02/27/17 0438 02/27/17 0856  BP: (!) 104/46   Pulse: 76 71  Resp: 18   Temp: 98.6 F (37 C)     Left shoulder incision healing well nv intact distally No rashes or edema  LABS  Recent Labs  02/25/17 0755  HGB 14.8  HCT 44.9     Recent Labs  02/25/17 0755  NA 137  K 4.4  BUN 8  CREATININE 1.01*  GLUCOSE 142*     Assessment/Plan: 3 Days Post-Op Procedure(s) (LRB): REVERSE LEFT SHOULDER ARTHROPLASTY (Left) D/c home today Do not feel strongly about home oxygen Pulmonary toilet at home hourly f/u in 2 weeks    Merla Riches, MPAS, PA-C  02/27/2017, 9:35 AM

## 2017-02-27 NOTE — Discharge Summary (Signed)
Physician Discharge Summary   Patient ID: Alexis Edwards MRN: [id number] DOB/AGE: 28-Sep-1946 71 y.o.  Admit date: 02/24/2017 Discharge date: 02/27/2017  Admission Diagnoses:  Active Problems:   S/P shoulder replacement, left   Discharge Diagnoses:  Same   Surgeries: Procedure(s): REVERSE LEFT SHOULDER ARTHROPLASTY on 02/24/2017   Consultants: PT/OT  Discharged Condition: Stable  Hospital Course: Alexis Edwards is an 71 y.o. female who was admitted 02/24/2017 with a chief complaint of left shoulder pain, and found to have a diagnosis of left shoulder rotator cuff arthropathy.  They were brought to the operating room on 02/24/2017 and underwent the above named procedures.    The patient had an uncomplicated hospital course and was stable for discharge.  Recent vital signs:  Vitals:   02/27/17 0438 02/27/17 0856  BP: (!) 104/46   Pulse: 76 71  Resp: 18   Temp: 98.6 F (37 C)     Recent laboratory studies:  Results for orders placed or performed during the hospital encounter of 02/24/17  Hemoglobin and hematocrit, blood  Result Value Ref Range   Hemoglobin 14.8 12.0 - 15.0 g/dL   HCT 44.9 36.0 - 73.5 %  Basic metabolic panel  Result Value Ref Range   Sodium 137 135 - 145 mmol/L   Potassium 4.4 3.5 - 5.1 mmol/L   Chloride 98 (L) 101 - 111 mmol/L   CO2 30 22 - 32 mmol/L   Glucose, Bld 142 (H) 65 - 99 mg/dL   BUN 8 6 - 20 mg/dL   Creatinine, Ser 1.01 (H) 0.44 - 1.00 mg/dL   Calcium 8.9 8.9 - 10.3 mg/dL   GFR calc non Af Amer 55 (L) >60 mL/min   GFR calc Af Amer >60 >60 mL/min   Anion gap 9 5 - 15    Discharge Medications:   Allergies as of 02/27/2017      Reactions   Macrobid [nitrofurantoin] Itching, Rash   Sulfa Antibiotics Itching, Rash      Medication List    TAKE these medications   aspirin 81 MG chewable tablet Chew by mouth daily.   B-complex with vitamin C tablet Take 1 tablet by mouth daily. PLAIN vitamin B complex without Vit C   CALCIUM  PO Take 1 tablet by mouth 2 (two) times daily.   DULoxetine 60 MG capsule Commonly known as:  CYMBALTA Take 60 mg by mouth daily.   furosemide 20 MG tablet Commonly known as:  LASIX Take 20 mg by mouth daily.   levocetirizine 5 MG tablet Commonly known as:  XYZAL Take 5 mg by mouth daily as needed for allergies.   loratadine 10 MG tablet Commonly known as:  CLARITIN Take 10 mg by mouth daily as needed for allergies.   methocarbamol 500 MG tablet Commonly known as:  ROBAXIN Take 1 tablet (500 mg total) by mouth 3 (three) times daily as needed.   multivitamin with minerals Tabs tablet Take 1 tablet by mouth daily.   omega-3 acid ethyl esters 1 g capsule Commonly known as:  LOVAZA Take 1 g by mouth 2 (two) times daily.   omeprazole 20 MG capsule Commonly known as:  PRILOSEC Take 20 mg by mouth daily.   Oxycodone HCl 10 MG Tabs Take 10 mg by mouth every 6 (six) hours. What changed:  Another medication with the same name was added. Make sure you understand how and when to take each.   oxyCODONE 5 MG immediate release tablet Commonly known as:  ROXICODONE Take  2 tablets (10 mg total) by mouth every 4 (four) hours as needed for severe pain. What changed:  You were already taking a medication with the same name, and this prescription was added. Make sure you understand how and when to take each.   solifenacin 10 MG tablet Commonly known as:  VESICARE Take 10 mg by mouth at bedtime.   VITAMIN C PO Take 1 tablet by mouth 2 (two) times daily.   VITAMIN D PO Take 1 tablet by mouth at bedtime.            Durable Medical Equipment        Start     Ordered   02/27/17 [phone]  For home use only DME oxygen  Once    Comments:  SATURATION QUALIFICATIONS: (This note is used to comply with regulatory documentation for home oxygen)  Patient Saturations on Room Air at Rest = 90%  Patient Saturations on Room Air while Ambulating = 87%  Patient Saturations on 3 Liters of  oxygen while Ambulating = 92-94%  Please briefly explain why patient needs home oxygen: Patient requires supplemental oxygen to maintain oxygen saturations at acceptable, safe levels with physical activity.   Question Answer Comment  Mode or (Route) Nasal cannula   Liters per Minute 2   Oxygen conserving device Yes   Oxygen delivery system Gas      02/27/17 0856      Diagnostic Studies: Dg Chest Port 1 View  Result Date: 02/26/2017 CLINICAL DATA:  Status post left shoulder arthroplasty. EXAM: PORTABLE CHEST 1 VIEW COMPARISON:  12/23/2016 FINDINGS: Normal heart size. No pleural effusion or edema. Scar versus atelectasis noted in the left base. Previous left shoulder total arthroplasty. IMPRESSION: 1. Scar versus atelectasis in the left base. Electronically Signed   By: Kerby Moors M.D.   On: 02/26/2017 09:05   Dg Shoulder Left Port  Result Date: 02/24/2017 CLINICAL DATA:  Postop day 0 left shoulder arthroplasty. EXAM: Portable LEFT SHOULDER - 1 VIEW COMPARISON:  Preoperative left shoulder CT 12/23/2016. FINDINGS: Left shoulder arthroplasty with anatomic alignment and no acute complicating features. Calcification adjacent to the humeral neck which is likely dystrophic. Acromioclavicular joint intact with mild degenerative changes. IMPRESSION: Left shoulder arthroplasty with anatomic alignment and no acute complicating features. Electronically Signed   By: Evangeline Dakin M.D.   On: 02/24/2017 10:18    Disposition: 01-Home or Self Care  Discharge Instructions    Call MD / Call 911    Complete by:  As directed    If you experience chest pain or shortness of breath, CALL 911 and be transported to the hospital emergency room.  If you develope a fever above 101 F, pus (white drainage) or increased drainage or redness at the wound, or calf pain, call your surgeon's office.   Constipation Prevention    Complete by:  As directed    Drink plenty of fluids.  Prune juice may be helpful.  You  may use a stool softener, such as Colace (over the counter) 100 mg twice a day.  Use MiraLax (over the counter) for constipation as needed.   Diet - low sodium heart healthy    Complete by:  As directed    Increase activity slowly as tolerated    Complete by:  As directed       Follow-up Information    Netta Cedars, MD. Call in 2 weeks.   Specialty:  Orthopedic Surgery Why:  [phone] Contact information: [place]  [address] [address] [phone]            Signed: Ventura Bruns 02/27/2017, 9:38 AM

## 2017-03-02 DIAGNOSIS — M1990 Unspecified osteoarthritis, unspecified site: Secondary | ICD-10-CM | POA: Diagnosis not present

## 2017-03-02 DIAGNOSIS — Z96612 Presence of left artificial shoulder joint: Secondary | ICD-10-CM | POA: Diagnosis not present

## 2017-03-02 DIAGNOSIS — M545 Low back pain: Secondary | ICD-10-CM | POA: Diagnosis not present

## 2017-03-02 DIAGNOSIS — Z471 Aftercare following joint replacement surgery: Secondary | ICD-10-CM | POA: Diagnosis not present

## 2017-03-02 DIAGNOSIS — Z7982 Long term (current) use of aspirin: Secondary | ICD-10-CM | POA: Diagnosis not present

## 2017-03-08 DIAGNOSIS — M1990 Unspecified osteoarthritis, unspecified site: Secondary | ICD-10-CM | POA: Diagnosis not present

## 2017-03-08 DIAGNOSIS — Z96612 Presence of left artificial shoulder joint: Secondary | ICD-10-CM | POA: Diagnosis not present

## 2017-03-08 DIAGNOSIS — Z7982 Long term (current) use of aspirin: Secondary | ICD-10-CM | POA: Diagnosis not present

## 2017-03-08 DIAGNOSIS — Z471 Aftercare following joint replacement surgery: Secondary | ICD-10-CM | POA: Diagnosis not present

## 2017-03-08 DIAGNOSIS — M545 Low back pain: Secondary | ICD-10-CM | POA: Diagnosis not present

## 2017-03-09 DIAGNOSIS — Z471 Aftercare following joint replacement surgery: Secondary | ICD-10-CM | POA: Diagnosis not present

## 2017-03-09 DIAGNOSIS — Z96612 Presence of left artificial shoulder joint: Secondary | ICD-10-CM | POA: Diagnosis not present

## 2017-03-10 DIAGNOSIS — Z7982 Long term (current) use of aspirin: Secondary | ICD-10-CM | POA: Diagnosis not present

## 2017-03-10 DIAGNOSIS — Z471 Aftercare following joint replacement surgery: Secondary | ICD-10-CM | POA: Diagnosis not present

## 2017-03-10 DIAGNOSIS — M545 Low back pain: Secondary | ICD-10-CM | POA: Diagnosis not present

## 2017-03-10 DIAGNOSIS — Z96612 Presence of left artificial shoulder joint: Secondary | ICD-10-CM | POA: Diagnosis not present

## 2017-03-10 DIAGNOSIS — M1990 Unspecified osteoarthritis, unspecified site: Secondary | ICD-10-CM | POA: Diagnosis not present

## 2017-03-15 DIAGNOSIS — M1990 Unspecified osteoarthritis, unspecified site: Secondary | ICD-10-CM | POA: Diagnosis not present

## 2017-03-15 DIAGNOSIS — M545 Low back pain: Secondary | ICD-10-CM | POA: Diagnosis not present

## 2017-03-15 DIAGNOSIS — Z471 Aftercare following joint replacement surgery: Secondary | ICD-10-CM | POA: Diagnosis not present

## 2017-03-15 DIAGNOSIS — Z96612 Presence of left artificial shoulder joint: Secondary | ICD-10-CM | POA: Diagnosis not present

## 2017-03-15 DIAGNOSIS — Z7982 Long term (current) use of aspirin: Secondary | ICD-10-CM | POA: Diagnosis not present

## 2017-03-16 DIAGNOSIS — M5136 Other intervertebral disc degeneration, lumbar region: Secondary | ICD-10-CM | POA: Diagnosis not present

## 2017-03-16 DIAGNOSIS — M4310 Spondylolisthesis, site unspecified: Secondary | ICD-10-CM | POA: Diagnosis not present

## 2017-03-16 DIAGNOSIS — G8929 Other chronic pain: Secondary | ICD-10-CM | POA: Diagnosis not present

## 2017-03-16 DIAGNOSIS — M25512 Pain in left shoulder: Secondary | ICD-10-CM | POA: Diagnosis not present

## 2017-03-17 DIAGNOSIS — M545 Low back pain: Secondary | ICD-10-CM | POA: Diagnosis not present

## 2017-03-17 DIAGNOSIS — M1990 Unspecified osteoarthritis, unspecified site: Secondary | ICD-10-CM | POA: Diagnosis not present

## 2017-03-17 DIAGNOSIS — Z7982 Long term (current) use of aspirin: Secondary | ICD-10-CM | POA: Diagnosis not present

## 2017-03-17 DIAGNOSIS — Z471 Aftercare following joint replacement surgery: Secondary | ICD-10-CM | POA: Diagnosis not present

## 2017-03-17 DIAGNOSIS — Z96612 Presence of left artificial shoulder joint: Secondary | ICD-10-CM | POA: Diagnosis not present

## 2017-03-22 DIAGNOSIS — Z96612 Presence of left artificial shoulder joint: Secondary | ICD-10-CM | POA: Diagnosis not present

## 2017-03-22 DIAGNOSIS — M545 Low back pain: Secondary | ICD-10-CM | POA: Diagnosis not present

## 2017-03-22 DIAGNOSIS — Z471 Aftercare following joint replacement surgery: Secondary | ICD-10-CM | POA: Diagnosis not present

## 2017-03-22 DIAGNOSIS — M1990 Unspecified osteoarthritis, unspecified site: Secondary | ICD-10-CM | POA: Diagnosis not present

## 2017-03-22 DIAGNOSIS — Z7982 Long term (current) use of aspirin: Secondary | ICD-10-CM | POA: Diagnosis not present

## 2017-03-24 DIAGNOSIS — M545 Low back pain: Secondary | ICD-10-CM | POA: Diagnosis not present

## 2017-03-24 DIAGNOSIS — M1990 Unspecified osteoarthritis, unspecified site: Secondary | ICD-10-CM | POA: Diagnosis not present

## 2017-03-24 DIAGNOSIS — Z471 Aftercare following joint replacement surgery: Secondary | ICD-10-CM | POA: Diagnosis not present

## 2017-03-24 DIAGNOSIS — Z96612 Presence of left artificial shoulder joint: Secondary | ICD-10-CM | POA: Diagnosis not present

## 2017-03-24 DIAGNOSIS — Z7982 Long term (current) use of aspirin: Secondary | ICD-10-CM | POA: Diagnosis not present

## 2017-03-28 DIAGNOSIS — M545 Low back pain: Secondary | ICD-10-CM | POA: Diagnosis not present

## 2017-03-28 DIAGNOSIS — Z471 Aftercare following joint replacement surgery: Secondary | ICD-10-CM | POA: Diagnosis not present

## 2017-03-28 DIAGNOSIS — Z96612 Presence of left artificial shoulder joint: Secondary | ICD-10-CM | POA: Diagnosis not present

## 2017-03-28 DIAGNOSIS — Z7982 Long term (current) use of aspirin: Secondary | ICD-10-CM | POA: Diagnosis not present

## 2017-03-28 DIAGNOSIS — M1990 Unspecified osteoarthritis, unspecified site: Secondary | ICD-10-CM | POA: Diagnosis not present

## 2017-03-30 DIAGNOSIS — Z471 Aftercare following joint replacement surgery: Secondary | ICD-10-CM | POA: Diagnosis not present

## 2017-03-30 DIAGNOSIS — M545 Low back pain: Secondary | ICD-10-CM | POA: Diagnosis not present

## 2017-03-30 DIAGNOSIS — Z96612 Presence of left artificial shoulder joint: Secondary | ICD-10-CM | POA: Diagnosis not present

## 2017-03-30 DIAGNOSIS — Z7982 Long term (current) use of aspirin: Secondary | ICD-10-CM | POA: Diagnosis not present

## 2017-03-30 DIAGNOSIS — M1990 Unspecified osteoarthritis, unspecified site: Secondary | ICD-10-CM | POA: Diagnosis not present

## 2017-04-06 DIAGNOSIS — Z96612 Presence of left artificial shoulder joint: Secondary | ICD-10-CM | POA: Diagnosis not present

## 2017-04-06 DIAGNOSIS — Z471 Aftercare following joint replacement surgery: Secondary | ICD-10-CM | POA: Diagnosis not present

## 2017-05-02 DIAGNOSIS — Z6841 Body Mass Index (BMI) 40.0 and over, adult: Secondary | ICD-10-CM | POA: Diagnosis not present

## 2017-05-02 DIAGNOSIS — E785 Hyperlipidemia, unspecified: Secondary | ICD-10-CM | POA: Diagnosis not present

## 2017-05-02 DIAGNOSIS — Z1389 Encounter for screening for other disorder: Secondary | ICD-10-CM | POA: Diagnosis not present

## 2017-05-02 DIAGNOSIS — Z0000 Encounter for general adult medical examination without abnormal findings: Secondary | ICD-10-CM | POA: Diagnosis not present

## 2017-05-02 DIAGNOSIS — Z136 Encounter for screening for cardiovascular disorders: Secondary | ICD-10-CM | POA: Diagnosis not present

## 2017-05-02 DIAGNOSIS — Z9181 History of falling: Secondary | ICD-10-CM | POA: Diagnosis not present

## 2017-05-02 DIAGNOSIS — Z23 Encounter for immunization: Secondary | ICD-10-CM | POA: Diagnosis not present

## 2017-05-02 DIAGNOSIS — Z1211 Encounter for screening for malignant neoplasm of colon: Secondary | ICD-10-CM | POA: Diagnosis not present

## 2017-05-15 ENCOUNTER — Other Ambulatory Visit: Payer: Self-pay

## 2017-05-16 DIAGNOSIS — F329 Major depressive disorder, single episode, unspecified: Secondary | ICD-10-CM | POA: Diagnosis not present

## 2017-05-16 DIAGNOSIS — I509 Heart failure, unspecified: Secondary | ICD-10-CM | POA: Diagnosis not present

## 2017-05-18 DIAGNOSIS — Z471 Aftercare following joint replacement surgery: Secondary | ICD-10-CM | POA: Diagnosis not present

## 2017-05-18 DIAGNOSIS — Z96612 Presence of left artificial shoulder joint: Secondary | ICD-10-CM | POA: Diagnosis not present

## 2017-05-18 DIAGNOSIS — M19012 Primary osteoarthritis, left shoulder: Secondary | ICD-10-CM | POA: Diagnosis not present

## 2017-07-04 DIAGNOSIS — E039 Hypothyroidism, unspecified: Secondary | ICD-10-CM | POA: Diagnosis not present

## 2017-07-04 DIAGNOSIS — R2681 Unsteadiness on feet: Secondary | ICD-10-CM | POA: Diagnosis not present

## 2017-07-04 DIAGNOSIS — R7303 Prediabetes: Secondary | ICD-10-CM | POA: Diagnosis not present

## 2017-07-04 DIAGNOSIS — I509 Heart failure, unspecified: Secondary | ICD-10-CM | POA: Diagnosis not present

## 2017-07-04 DIAGNOSIS — F329 Major depressive disorder, single episode, unspecified: Secondary | ICD-10-CM | POA: Diagnosis not present

## 2017-07-04 DIAGNOSIS — Z6841 Body Mass Index (BMI) 40.0 and over, adult: Secondary | ICD-10-CM | POA: Diagnosis not present

## 2017-07-04 DIAGNOSIS — R5382 Chronic fatigue, unspecified: Secondary | ICD-10-CM | POA: Diagnosis not present

## 2017-07-10 DIAGNOSIS — M6281 Muscle weakness (generalized): Secondary | ICD-10-CM | POA: Diagnosis not present

## 2017-07-10 DIAGNOSIS — R2681 Unsteadiness on feet: Secondary | ICD-10-CM | POA: Diagnosis not present

## 2017-07-10 DIAGNOSIS — M25561 Pain in right knee: Secondary | ICD-10-CM | POA: Diagnosis not present

## 2017-07-13 DIAGNOSIS — R2681 Unsteadiness on feet: Secondary | ICD-10-CM | POA: Diagnosis not present

## 2017-07-13 DIAGNOSIS — M25561 Pain in right knee: Secondary | ICD-10-CM | POA: Diagnosis not present

## 2017-07-13 DIAGNOSIS — M6281 Muscle weakness (generalized): Secondary | ICD-10-CM | POA: Diagnosis not present

## 2017-07-18 DIAGNOSIS — R2681 Unsteadiness on feet: Secondary | ICD-10-CM | POA: Diagnosis not present

## 2017-07-18 DIAGNOSIS — M25561 Pain in right knee: Secondary | ICD-10-CM | POA: Diagnosis not present

## 2017-07-18 DIAGNOSIS — M6281 Muscle weakness (generalized): Secondary | ICD-10-CM | POA: Diagnosis not present

## 2017-07-20 DIAGNOSIS — M25561 Pain in right knee: Secondary | ICD-10-CM | POA: Diagnosis not present

## 2017-07-20 DIAGNOSIS — R2681 Unsteadiness on feet: Secondary | ICD-10-CM | POA: Diagnosis not present

## 2017-07-20 DIAGNOSIS — M6281 Muscle weakness (generalized): Secondary | ICD-10-CM | POA: Diagnosis not present

## 2017-07-24 DIAGNOSIS — R2681 Unsteadiness on feet: Secondary | ICD-10-CM | POA: Diagnosis not present

## 2017-07-24 DIAGNOSIS — M6281 Muscle weakness (generalized): Secondary | ICD-10-CM | POA: Diagnosis not present

## 2017-07-24 DIAGNOSIS — M25561 Pain in right knee: Secondary | ICD-10-CM | POA: Diagnosis not present

## 2017-07-25 DIAGNOSIS — Z23 Encounter for immunization: Secondary | ICD-10-CM | POA: Diagnosis not present

## 2017-07-26 DIAGNOSIS — M6281 Muscle weakness (generalized): Secondary | ICD-10-CM | POA: Diagnosis not present

## 2017-07-26 DIAGNOSIS — M25561 Pain in right knee: Secondary | ICD-10-CM | POA: Diagnosis not present

## 2017-07-26 DIAGNOSIS — R2681 Unsteadiness on feet: Secondary | ICD-10-CM | POA: Diagnosis not present

## 2017-07-31 DIAGNOSIS — M25561 Pain in right knee: Secondary | ICD-10-CM | POA: Diagnosis not present

## 2017-07-31 DIAGNOSIS — R2681 Unsteadiness on feet: Secondary | ICD-10-CM | POA: Diagnosis not present

## 2017-07-31 DIAGNOSIS — M6281 Muscle weakness (generalized): Secondary | ICD-10-CM | POA: Diagnosis not present

## 2017-08-08 DIAGNOSIS — M6281 Muscle weakness (generalized): Secondary | ICD-10-CM | POA: Diagnosis not present

## 2017-08-08 DIAGNOSIS — R2681 Unsteadiness on feet: Secondary | ICD-10-CM | POA: Diagnosis not present

## 2017-08-08 DIAGNOSIS — M25561 Pain in right knee: Secondary | ICD-10-CM | POA: Diagnosis not present

## 2017-08-09 DIAGNOSIS — M5136 Other intervertebral disc degeneration, lumbar region: Secondary | ICD-10-CM | POA: Diagnosis not present

## 2017-08-09 DIAGNOSIS — G894 Chronic pain syndrome: Secondary | ICD-10-CM | POA: Diagnosis not present

## 2017-08-09 DIAGNOSIS — M4310 Spondylolisthesis, site unspecified: Secondary | ICD-10-CM | POA: Diagnosis not present

## 2017-08-09 DIAGNOSIS — Z79891 Long term (current) use of opiate analgesic: Secondary | ICD-10-CM | POA: Diagnosis not present

## 2017-08-14 DIAGNOSIS — M25561 Pain in right knee: Secondary | ICD-10-CM | POA: Diagnosis not present

## 2017-08-14 DIAGNOSIS — R2681 Unsteadiness on feet: Secondary | ICD-10-CM | POA: Diagnosis not present

## 2017-08-14 DIAGNOSIS — M6281 Muscle weakness (generalized): Secondary | ICD-10-CM | POA: Diagnosis not present

## 2017-08-15 DIAGNOSIS — M25561 Pain in right knee: Secondary | ICD-10-CM | POA: Diagnosis not present

## 2017-08-15 DIAGNOSIS — R2681 Unsteadiness on feet: Secondary | ICD-10-CM | POA: Diagnosis not present

## 2017-08-15 DIAGNOSIS — M6281 Muscle weakness (generalized): Secondary | ICD-10-CM | POA: Diagnosis not present

## 2017-08-17 DIAGNOSIS — R2681 Unsteadiness on feet: Secondary | ICD-10-CM | POA: Diagnosis not present

## 2017-08-17 DIAGNOSIS — M25561 Pain in right knee: Secondary | ICD-10-CM | POA: Diagnosis not present

## 2017-08-17 DIAGNOSIS — M6281 Muscle weakness (generalized): Secondary | ICD-10-CM | POA: Diagnosis not present

## 2017-10-10 DIAGNOSIS — Z1231 Encounter for screening mammogram for malignant neoplasm of breast: Secondary | ICD-10-CM | POA: Diagnosis not present

## 2017-10-10 DIAGNOSIS — Z6841 Body Mass Index (BMI) 40.0 and over, adult: Secondary | ICD-10-CM | POA: Diagnosis not present

## 2017-10-10 DIAGNOSIS — I739 Peripheral vascular disease, unspecified: Secondary | ICD-10-CM | POA: Diagnosis not present

## 2017-10-13 ENCOUNTER — Other Ambulatory Visit: Payer: Self-pay | Admitting: Family Medicine

## 2017-10-13 DIAGNOSIS — Z139 Encounter for screening, unspecified: Secondary | ICD-10-CM

## 2017-10-25 ENCOUNTER — Other Ambulatory Visit: Payer: Self-pay

## 2017-10-25 DIAGNOSIS — I70219 Atherosclerosis of native arteries of extremities with intermittent claudication, unspecified extremity: Secondary | ICD-10-CM

## 2017-11-28 DIAGNOSIS — E785 Hyperlipidemia, unspecified: Secondary | ICD-10-CM | POA: Diagnosis not present

## 2017-11-28 DIAGNOSIS — F329 Major depressive disorder, single episode, unspecified: Secondary | ICD-10-CM | POA: Diagnosis not present

## 2017-11-28 DIAGNOSIS — Z6841 Body Mass Index (BMI) 40.0 and over, adult: Secondary | ICD-10-CM | POA: Diagnosis not present

## 2017-11-28 DIAGNOSIS — I509 Heart failure, unspecified: Secondary | ICD-10-CM | POA: Diagnosis not present

## 2017-11-28 DIAGNOSIS — K219 Gastro-esophageal reflux disease without esophagitis: Secondary | ICD-10-CM | POA: Diagnosis not present

## 2017-11-28 DIAGNOSIS — E039 Hypothyroidism, unspecified: Secondary | ICD-10-CM | POA: Diagnosis not present

## 2017-11-28 DIAGNOSIS — F339 Major depressive disorder, recurrent, unspecified: Secondary | ICD-10-CM | POA: Diagnosis not present

## 2017-12-04 ENCOUNTER — Encounter: Payer: Medicare Other | Admitting: Surgery

## 2017-12-04 ENCOUNTER — Encounter (HOSPITAL_COMMUNITY): Payer: Medicare Other

## 2017-12-08 DIAGNOSIS — R5382 Chronic fatigue, unspecified: Secondary | ICD-10-CM | POA: Diagnosis not present

## 2017-12-08 DIAGNOSIS — M791 Myalgia, unspecified site: Secondary | ICD-10-CM | POA: Diagnosis not present

## 2017-12-08 DIAGNOSIS — Z6841 Body Mass Index (BMI) 40.0 and over, adult: Secondary | ICD-10-CM | POA: Diagnosis not present

## 2017-12-08 DIAGNOSIS — N3281 Overactive bladder: Secondary | ICD-10-CM | POA: Diagnosis not present

## 2017-12-13 DIAGNOSIS — M545 Low back pain: Secondary | ICD-10-CM | POA: Diagnosis not present

## 2017-12-13 DIAGNOSIS — G894 Chronic pain syndrome: Secondary | ICD-10-CM | POA: Diagnosis not present

## 2018-01-22 ENCOUNTER — Encounter: Payer: Medicare Other | Admitting: Surgery

## 2018-01-22 ENCOUNTER — Inpatient Hospital Stay (HOSPITAL_COMMUNITY): Admission: RE | Admit: 2018-01-22 | Payer: Medicare Other | Source: Ambulatory Visit

## 2018-01-29 IMAGING — DX DG SHOULDER 1V*L*
1 series · 1 of 1 positions shown · non-contrast
Comparison: Preoperative left shoulder CT 12/23/2016.

CLINICAL DATA: Postop day 0 left shoulder arthroplasty.

EXAM:
Portable LEFT SHOULDER - 1 VIEW

[shoulder ap]
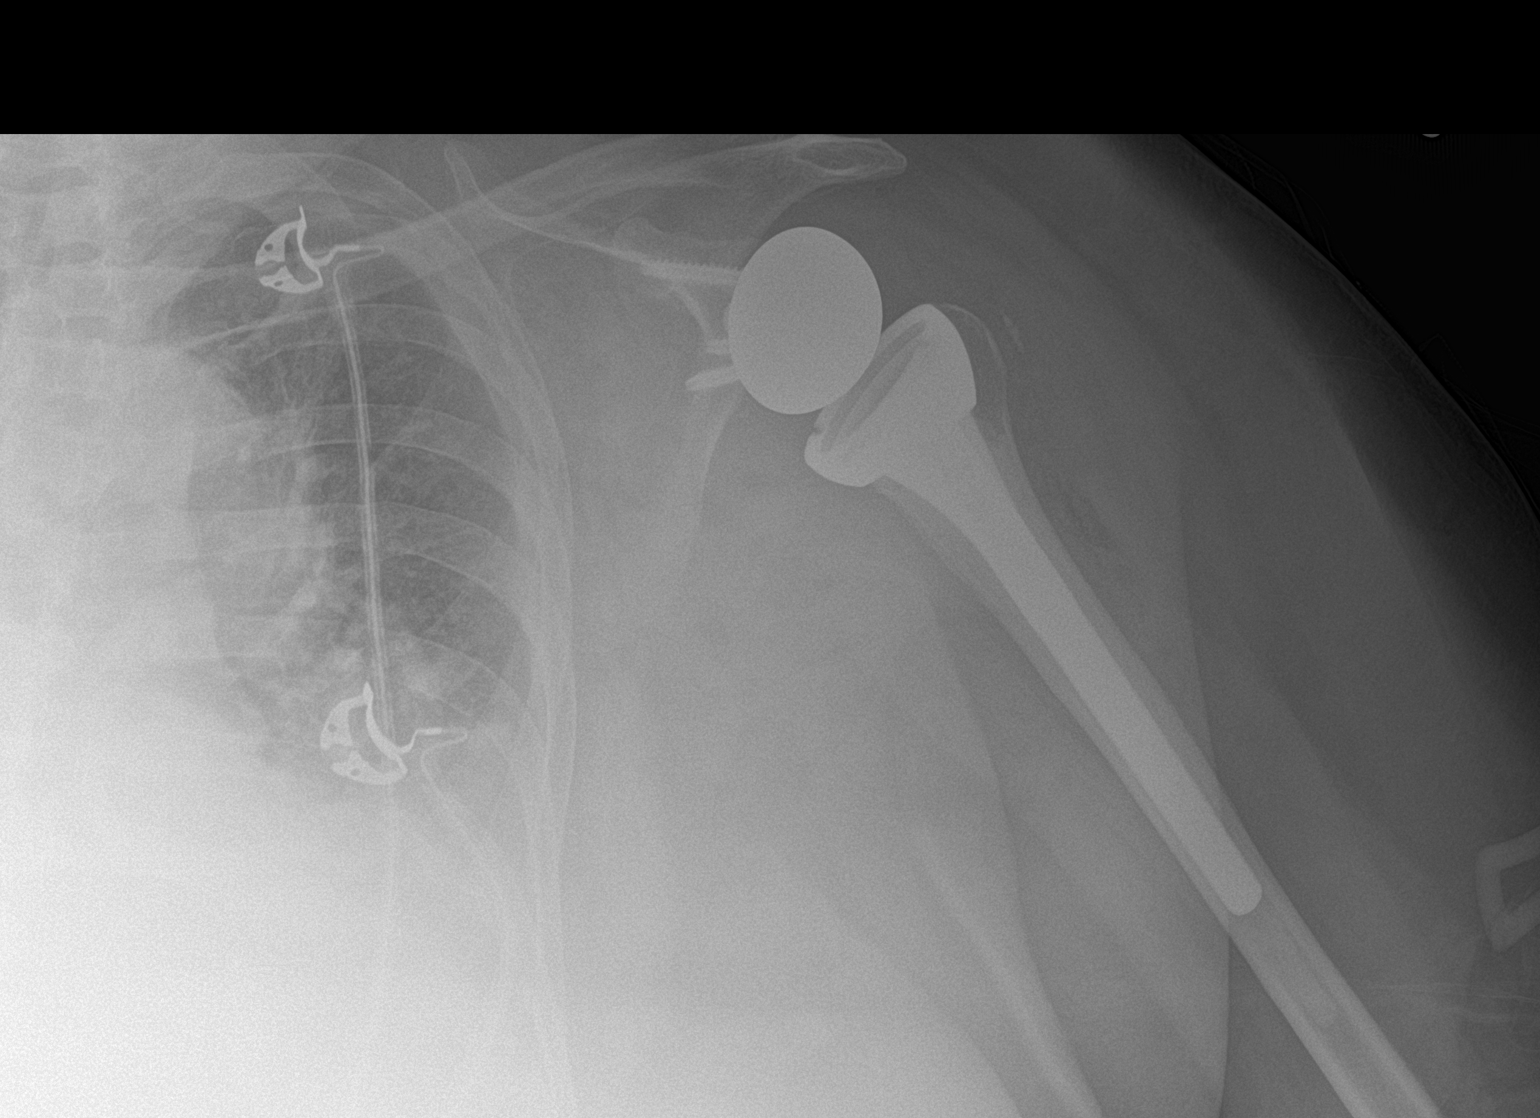

[1 of 1 positions shown; findings below may reference images not displayed]

FINDINGS: Left shoulder arthroplasty with anatomic alignment and no acute
complicating features. Calcification adjacent to the humeral neck
which is likely dystrophic. Acromioclavicular joint intact with mild
degenerative changes.
IMPRESSION: Left shoulder arthroplasty with anatomic alignment and no acute
complicating features.

## 2018-02-13 DIAGNOSIS — K5903 Drug induced constipation: Secondary | ICD-10-CM | POA: Diagnosis not present

## 2018-02-13 DIAGNOSIS — Z6841 Body Mass Index (BMI) 40.0 and over, adult: Secondary | ICD-10-CM | POA: Diagnosis not present

## 2018-02-13 DIAGNOSIS — F329 Major depressive disorder, single episode, unspecified: Secondary | ICD-10-CM | POA: Diagnosis not present

## 2018-03-27 DIAGNOSIS — Z6841 Body Mass Index (BMI) 40.0 and over, adult: Secondary | ICD-10-CM | POA: Diagnosis not present

## 2018-03-27 DIAGNOSIS — F329 Major depressive disorder, single episode, unspecified: Secondary | ICD-10-CM | POA: Diagnosis not present

## 2018-03-27 DIAGNOSIS — R5382 Chronic fatigue, unspecified: Secondary | ICD-10-CM | POA: Diagnosis not present

## 2018-03-29 ENCOUNTER — Other Ambulatory Visit: Payer: Self-pay

## 2018-03-29 ENCOUNTER — Encounter (HOSPITAL_COMMUNITY): Payer: Self-pay | Admitting: Emergency Medicine

## 2018-03-29 DIAGNOSIS — Z5321 Procedure and treatment not carried out due to patient leaving prior to being seen by health care provider: Secondary | ICD-10-CM | POA: Diagnosis not present

## 2018-03-29 DIAGNOSIS — R1032 Left lower quadrant pain: Secondary | ICD-10-CM | POA: Diagnosis present

## 2018-03-29 NOTE — ED Triage Notes (Addendum)
Pt from home with left flank pain x 2 days. Pt states pain started out intermittent but is now constant. Pt is afebrile and is not tachycardic. Pt reports her urine is dark brown. Pt took oxycodone 10 and 2 extra strength tylenol around 2000

## 2018-03-30 ENCOUNTER — Other Ambulatory Visit: Payer: Self-pay

## 2018-03-30 ENCOUNTER — Emergency Department (HOSPITAL_COMMUNITY)
Admission: EM | Admit: 2018-03-30 | Discharge: 2018-03-30 | Discharge status: Against Medical Advice | Payer: Medicare Other | Attending: Emergency Medicine | Admitting: Emergency Medicine

## 2018-03-30 LAB — URINALYSIS, ROUTINE W REFLEX MICROSCOPIC
Bilirubin Urine: NEGATIVE
Glucose, UA: NEGATIVE mg/dL
Ketones, ur: 5 mg/dL — AB
Nitrite: NEGATIVE
Protein, ur: 30 mg/dL — AB
RBC / HPF: >50 RBC/hpf — ABNORMAL HIGH (ref 0–5)
Specific Gravity, Urine: 1.025 (ref 1.005–1.030)
WBC, UA: >50 WBC/hpf — ABNORMAL HIGH (ref 0–5)
pH: 5 (ref 5.0–8.0)

## 2018-03-30 NOTE — ED Notes (Signed)
Follow up call made  Calling her md  03/30/18  1125  s coble rn

## 2018-03-30 NOTE — ED Notes (Signed)
Pt husband upset about wait time. He was made aware that we have gotten her urine processed and are working to provide excellent care for her.

## 2018-03-30 NOTE — ED Notes (Signed)
Pt returned pt's stickers and armband and stated that he was going elsewhere and did not want to wait any longer.

## 2018-04-02 DIAGNOSIS — N3091 Cystitis, unspecified with hematuria: Secondary | ICD-10-CM | POA: Diagnosis not present

## 2018-04-02 DIAGNOSIS — Z6841 Body Mass Index (BMI) 40.0 and over, adult: Secondary | ICD-10-CM | POA: Diagnosis not present

## 2018-04-16 DIAGNOSIS — Z1339 Encounter for screening examination for other mental health and behavioral disorders: Secondary | ICD-10-CM | POA: Diagnosis not present

## 2018-04-16 DIAGNOSIS — F330 Major depressive disorder, recurrent, mild: Secondary | ICD-10-CM | POA: Diagnosis not present

## 2018-04-16 DIAGNOSIS — K5903 Drug induced constipation: Secondary | ICD-10-CM | POA: Diagnosis not present

## 2018-04-27 ENCOUNTER — Ambulatory Visit
Admission: RE | Admit: 2018-04-27 | Discharge: 2018-04-27 | Disposition: A | Discharge status: Home / Self Care | Payer: Medicare Other | Source: Ambulatory Visit | Attending: Family Medicine | Admitting: Family Medicine

## 2018-04-27 DIAGNOSIS — Z1231 Encounter for screening mammogram for malignant neoplasm of breast: Secondary | ICD-10-CM | POA: Diagnosis not present

## 2018-04-27 DIAGNOSIS — Z139 Encounter for screening, unspecified: Secondary | ICD-10-CM

## 2018-05-01 ENCOUNTER — Other Ambulatory Visit: Payer: Self-pay | Admitting: Family Medicine

## 2018-05-01 DIAGNOSIS — R928 Other abnormal and inconclusive findings on diagnostic imaging of breast: Secondary | ICD-10-CM

## 2018-05-03 ENCOUNTER — Ambulatory Visit
Admission: RE | Admit: 2018-05-03 | Discharge: 2018-05-03 | Disposition: A | Discharge status: Home / Self Care | Payer: Medicare Other | Source: Ambulatory Visit | Attending: Family Medicine | Admitting: Family Medicine

## 2018-05-03 DIAGNOSIS — R928 Other abnormal and inconclusive findings on diagnostic imaging of breast: Secondary | ICD-10-CM | POA: Diagnosis not present

## 2018-05-03 DIAGNOSIS — N6012 Diffuse cystic mastopathy of left breast: Secondary | ICD-10-CM | POA: Diagnosis not present

## 2018-05-10 DIAGNOSIS — Z136 Encounter for screening for cardiovascular disorders: Secondary | ICD-10-CM | POA: Diagnosis not present

## 2018-05-10 DIAGNOSIS — E785 Hyperlipidemia, unspecified: Secondary | ICD-10-CM | POA: Diagnosis not present

## 2018-05-10 DIAGNOSIS — Z139 Encounter for screening, unspecified: Secondary | ICD-10-CM | POA: Diagnosis not present

## 2018-05-10 DIAGNOSIS — Z6841 Body Mass Index (BMI) 40.0 and over, adult: Secondary | ICD-10-CM | POA: Diagnosis not present

## 2018-05-10 DIAGNOSIS — Z1331 Encounter for screening for depression: Secondary | ICD-10-CM | POA: Diagnosis not present

## 2018-05-10 DIAGNOSIS — Z0000 Encounter for general adult medical examination without abnormal findings: Secondary | ICD-10-CM | POA: Diagnosis not present

## 2018-05-10 DIAGNOSIS — E669 Obesity, unspecified: Secondary | ICD-10-CM | POA: Diagnosis not present

## 2018-05-10 DIAGNOSIS — Z9181 History of falling: Secondary | ICD-10-CM | POA: Diagnosis not present

## 2018-05-11 DIAGNOSIS — N898 Other specified noninflammatory disorders of vagina: Secondary | ICD-10-CM | POA: Diagnosis not present

## 2018-05-11 DIAGNOSIS — Z6841 Body Mass Index (BMI) 40.0 and over, adult: Secondary | ICD-10-CM | POA: Diagnosis not present

## 2018-05-11 DIAGNOSIS — K625 Hemorrhage of anus and rectum: Secondary | ICD-10-CM | POA: Diagnosis not present

## 2018-05-15 ENCOUNTER — Other Ambulatory Visit: Payer: Self-pay | Admitting: Family Medicine

## 2018-05-15 DIAGNOSIS — K625 Hemorrhage of anus and rectum: Secondary | ICD-10-CM

## 2018-05-15 DIAGNOSIS — R1032 Left lower quadrant pain: Secondary | ICD-10-CM

## 2018-05-17 ENCOUNTER — Other Ambulatory Visit: Payer: Self-pay | Admitting: Nurse Practitioner

## 2018-05-17 DIAGNOSIS — K625 Hemorrhage of anus and rectum: Secondary | ICD-10-CM

## 2018-05-17 DIAGNOSIS — R1032 Left lower quadrant pain: Secondary | ICD-10-CM

## 2018-05-30 ENCOUNTER — Ambulatory Visit
Admission: RE | Admit: 2018-05-30 | Discharge: 2018-05-30 | Disposition: A | Discharge status: Home / Self Care | Payer: Medicare Other | Source: Ambulatory Visit | Attending: Nurse Practitioner | Admitting: Nurse Practitioner

## 2018-05-30 DIAGNOSIS — R1032 Left lower quadrant pain: Secondary | ICD-10-CM

## 2018-05-30 DIAGNOSIS — K5730 Diverticulosis of large intestine without perforation or abscess without bleeding: Secondary | ICD-10-CM | POA: Diagnosis not present

## 2018-05-30 DIAGNOSIS — K625 Hemorrhage of anus and rectum: Secondary | ICD-10-CM

## 2018-05-30 MED ORDER — IOPAMIDOL (ISOVUE-300) INJECTION 61%
100.0000 mL | Freq: Once | INTRAVENOUS | Status: AC | PRN
  Administered 2018-05-30: 100 mL via INTRAVENOUS

## 2018-06-01 DIAGNOSIS — N3281 Overactive bladder: Secondary | ICD-10-CM | POA: Diagnosis not present

## 2018-06-01 DIAGNOSIS — M8580 Other specified disorders of bone density and structure, unspecified site: Secondary | ICD-10-CM | POA: Diagnosis not present

## 2018-06-01 DIAGNOSIS — Z6841 Body Mass Index (BMI) 40.0 and over, adult: Secondary | ICD-10-CM | POA: Diagnosis not present

## 2018-06-01 DIAGNOSIS — N200 Calculus of kidney: Secondary | ICD-10-CM | POA: Diagnosis not present

## 2018-06-01 DIAGNOSIS — E785 Hyperlipidemia, unspecified: Secondary | ICD-10-CM | POA: Diagnosis not present

## 2018-06-01 DIAGNOSIS — K5792 Diverticulitis of intestine, part unspecified, without perforation or abscess without bleeding: Secondary | ICD-10-CM | POA: Diagnosis not present

## 2018-06-08 ENCOUNTER — Other Ambulatory Visit: Payer: Self-pay | Admitting: Family Medicine

## 2018-06-08 ENCOUNTER — Ambulatory Visit
Admission: RE | Admit: 2018-06-08 | Discharge: 2018-06-08 | Disposition: A | Discharge status: Home / Self Care | Payer: Medicare Other | Source: Ambulatory Visit | Attending: Family Medicine | Admitting: Family Medicine

## 2018-06-08 DIAGNOSIS — N132 Hydronephrosis with renal and ureteral calculous obstruction: Secondary | ICD-10-CM | POA: Diagnosis not present

## 2018-06-08 DIAGNOSIS — Z6841 Body Mass Index (BMI) 40.0 and over, adult: Secondary | ICD-10-CM | POA: Diagnosis not present

## 2018-06-08 DIAGNOSIS — N200 Calculus of kidney: Secondary | ICD-10-CM | POA: Diagnosis not present

## 2018-06-08 DIAGNOSIS — K5792 Diverticulitis of intestine, part unspecified, without perforation or abscess without bleeding: Secondary | ICD-10-CM | POA: Diagnosis not present

## 2018-06-08 MED ORDER — IOPAMIDOL (ISOVUE-300) INJECTION 61%
100.0000 mL | Freq: Once | INTRAVENOUS | Status: AC | PRN
  Administered 2018-06-08: 100 mL via INTRAVENOUS

## 2018-06-12 DIAGNOSIS — K5732 Diverticulitis of large intestine without perforation or abscess without bleeding: Secondary | ICD-10-CM | POA: Diagnosis not present

## 2018-06-14 DIAGNOSIS — N200 Calculus of kidney: Secondary | ICD-10-CM | POA: Diagnosis not present

## 2018-06-14 DIAGNOSIS — K5730 Diverticulosis of large intestine without perforation or abscess without bleeding: Secondary | ICD-10-CM | POA: Diagnosis not present

## 2018-06-14 DIAGNOSIS — K5792 Diverticulitis of intestine, part unspecified, without perforation or abscess without bleeding: Secondary | ICD-10-CM | POA: Diagnosis not present

## 2018-06-19 DIAGNOSIS — K5732 Diverticulitis of large intestine without perforation or abscess without bleeding: Secondary | ICD-10-CM | POA: Diagnosis not present

## 2018-06-19 DIAGNOSIS — Z6841 Body Mass Index (BMI) 40.0 and over, adult: Secondary | ICD-10-CM | POA: Diagnosis not present

## 2018-06-20 DIAGNOSIS — R197 Diarrhea, unspecified: Secondary | ICD-10-CM | POA: Diagnosis not present

## 2018-06-28 DIAGNOSIS — N824 Other female intestinal-genital tract fistulae: Secondary | ICD-10-CM | POA: Diagnosis present

## 2018-06-28 DIAGNOSIS — M1990 Unspecified osteoarthritis, unspecified site: Secondary | ICD-10-CM | POA: Diagnosis present

## 2018-06-28 DIAGNOSIS — D494 Neoplasm of unspecified behavior of bladder: Secondary | ICD-10-CM | POA: Diagnosis not present

## 2018-06-28 DIAGNOSIS — I252 Old myocardial infarction: Secondary | ICD-10-CM | POA: Diagnosis not present

## 2018-06-28 DIAGNOSIS — Z87891 Personal history of nicotine dependence: Secondary | ICD-10-CM | POA: Diagnosis not present

## 2018-06-28 DIAGNOSIS — N823 Fistula of vagina to large intestine: Secondary | ICD-10-CM | POA: Diagnosis not present

## 2018-06-28 DIAGNOSIS — Z882 Allergy status to sulfonamides status: Secondary | ICD-10-CM | POA: Diagnosis not present

## 2018-06-28 DIAGNOSIS — Z7982 Long term (current) use of aspirin: Secondary | ICD-10-CM | POA: Diagnosis not present

## 2018-06-28 DIAGNOSIS — K5792 Diverticulitis of intestine, part unspecified, without perforation or abscess without bleeding: Secondary | ICD-10-CM | POA: Diagnosis not present

## 2018-06-28 DIAGNOSIS — K5732 Diverticulitis of large intestine without perforation or abscess without bleeding: Secondary | ICD-10-CM | POA: Diagnosis not present

## 2018-06-28 DIAGNOSIS — Z6841 Body Mass Index (BMI) 40.0 and over, adult: Secondary | ICD-10-CM | POA: Diagnosis not present

## 2018-06-28 DIAGNOSIS — N820 Vesicovaginal fistula: Secondary | ICD-10-CM | POA: Diagnosis not present

## 2018-06-28 DIAGNOSIS — N3289 Other specified disorders of bladder: Secondary | ICD-10-CM | POA: Diagnosis present

## 2018-06-28 DIAGNOSIS — Z79899 Other long term (current) drug therapy: Secondary | ICD-10-CM | POA: Diagnosis not present

## 2018-06-28 DIAGNOSIS — N201 Calculus of ureter: Secondary | ICD-10-CM | POA: Diagnosis not present

## 2018-06-28 DIAGNOSIS — J449 Chronic obstructive pulmonary disease, unspecified: Secondary | ICD-10-CM | POA: Diagnosis not present

## 2018-06-28 DIAGNOSIS — N329 Bladder disorder, unspecified: Secondary | ICD-10-CM | POA: Diagnosis not present

## 2018-07-07 DIAGNOSIS — I10 Essential (primary) hypertension: Secondary | ICD-10-CM | POA: Diagnosis not present

## 2018-07-07 DIAGNOSIS — G9389 Other specified disorders of brain: Secondary | ICD-10-CM | POA: Diagnosis not present

## 2018-07-07 DIAGNOSIS — N3000 Acute cystitis without hematuria: Secondary | ICD-10-CM | POA: Diagnosis not present

## 2018-07-07 DIAGNOSIS — J9600 Acute respiratory failure, unspecified whether with hypoxia or hypercapnia: Secondary | ICD-10-CM | POA: Diagnosis not present

## 2018-07-07 DIAGNOSIS — R0902 Hypoxemia: Secondary | ICD-10-CM | POA: Diagnosis not present

## 2018-07-07 DIAGNOSIS — Z87891 Personal history of nicotine dependence: Secondary | ICD-10-CM | POA: Diagnosis not present

## 2018-07-07 DIAGNOSIS — R569 Unspecified convulsions: Secondary | ICD-10-CM | POA: Diagnosis not present

## 2018-07-07 DIAGNOSIS — J984 Other disorders of lung: Secondary | ICD-10-CM | POA: Diagnosis not present

## 2018-07-07 DIAGNOSIS — R404 Transient alteration of awareness: Secondary | ICD-10-CM | POA: Diagnosis not present

## 2018-07-07 DIAGNOSIS — I252 Old myocardial infarction: Secondary | ICD-10-CM | POA: Diagnosis not present

## 2018-07-07 DIAGNOSIS — W19XXXA Unspecified fall, initial encounter: Secondary | ICD-10-CM | POA: Diagnosis not present

## 2018-07-07 DIAGNOSIS — R0602 Shortness of breath: Secondary | ICD-10-CM | POA: Diagnosis not present

## 2018-07-07 DIAGNOSIS — R410 Disorientation, unspecified: Secondary | ICD-10-CM | POA: Diagnosis not present

## 2018-07-07 DIAGNOSIS — R4182 Altered mental status, unspecified: Secondary | ICD-10-CM | POA: Diagnosis not present

## 2018-07-08 ENCOUNTER — Inpatient Hospital Stay (HOSPITAL_COMMUNITY): Payer: Medicare Other

## 2018-07-08 ENCOUNTER — Inpatient Hospital Stay (HOSPITAL_COMMUNITY)
Admission: AD | Admit: 2018-07-08 | Discharge: 2018-07-13 | DRG: 054 | Disposition: A | Discharge status: Home / Self Care | Payer: Medicare Other | Source: Other Acute Inpatient Hospital | Attending: Family Medicine | Admitting: Family Medicine

## 2018-07-08 ENCOUNTER — Encounter (HOSPITAL_COMMUNITY): Payer: Self-pay | Admitting: Radiology

## 2018-07-08 DIAGNOSIS — R569 Unspecified convulsions: Secondary | ICD-10-CM | POA: Diagnosis not present

## 2018-07-08 DIAGNOSIS — S82401A Unspecified fracture of shaft of right fibula, initial encounter for closed fracture: Secondary | ICD-10-CM | POA: Diagnosis present

## 2018-07-08 DIAGNOSIS — G939 Disorder of brain, unspecified: Secondary | ICD-10-CM | POA: Diagnosis not present

## 2018-07-08 DIAGNOSIS — E785 Hyperlipidemia, unspecified: Secondary | ICD-10-CM | POA: Diagnosis present

## 2018-07-08 DIAGNOSIS — G9340 Encephalopathy, unspecified: Secondary | ICD-10-CM

## 2018-07-08 DIAGNOSIS — R52 Pain, unspecified: Secondary | ICD-10-CM

## 2018-07-08 DIAGNOSIS — G4733 Obstructive sleep apnea (adult) (pediatric): Secondary | ICD-10-CM

## 2018-07-08 DIAGNOSIS — Z4659 Encounter for fitting and adjustment of other gastrointestinal appliance and device: Secondary | ICD-10-CM | POA: Diagnosis not present

## 2018-07-08 DIAGNOSIS — Z882 Allergy status to sulfonamides status: Secondary | ICD-10-CM | POA: Diagnosis not present

## 2018-07-08 DIAGNOSIS — E669 Obesity, unspecified: Secondary | ICD-10-CM | POA: Diagnosis present

## 2018-07-08 DIAGNOSIS — J449 Chronic obstructive pulmonary disease, unspecified: Secondary | ICD-10-CM | POA: Diagnosis present

## 2018-07-08 DIAGNOSIS — R001 Bradycardia, unspecified: Secondary | ICD-10-CM | POA: Diagnosis not present

## 2018-07-08 DIAGNOSIS — R0902 Hypoxemia: Secondary | ICD-10-CM | POA: Diagnosis not present

## 2018-07-08 DIAGNOSIS — I110 Hypertensive heart disease with heart failure: Secondary | ICD-10-CM | POA: Diagnosis present

## 2018-07-08 DIAGNOSIS — Z7982 Long term (current) use of aspirin: Secondary | ICD-10-CM

## 2018-07-08 DIAGNOSIS — Z888 Allergy status to other drugs, medicaments and biological substances status: Secondary | ICD-10-CM

## 2018-07-08 DIAGNOSIS — G40401 Other generalized epilepsy and epileptic syndromes, not intractable, with status epilepticus: Secondary | ICD-10-CM | POA: Diagnosis present

## 2018-07-08 DIAGNOSIS — L8990 Pressure ulcer of unspecified site, unspecified stage: Secondary | ICD-10-CM | POA: Diagnosis present

## 2018-07-08 DIAGNOSIS — G936 Cerebral edema: Secondary | ICD-10-CM | POA: Diagnosis not present

## 2018-07-08 DIAGNOSIS — Z9049 Acquired absence of other specified parts of digestive tract: Secondary | ICD-10-CM

## 2018-07-08 DIAGNOSIS — J9811 Atelectasis: Secondary | ICD-10-CM | POA: Diagnosis not present

## 2018-07-08 DIAGNOSIS — Z90710 Acquired absence of both cervix and uterus: Secondary | ICD-10-CM

## 2018-07-08 DIAGNOSIS — G9389 Other specified disorders of brain: Secondary | ICD-10-CM

## 2018-07-08 DIAGNOSIS — I509 Heart failure, unspecified: Secondary | ICD-10-CM | POA: Diagnosis not present

## 2018-07-08 DIAGNOSIS — F329 Major depressive disorder, single episode, unspecified: Secondary | ICD-10-CM | POA: Diagnosis present

## 2018-07-08 DIAGNOSIS — Z96612 Presence of left artificial shoulder joint: Secondary | ICD-10-CM | POA: Diagnosis present

## 2018-07-08 DIAGNOSIS — R739 Hyperglycemia, unspecified: Secondary | ICD-10-CM | POA: Diagnosis not present

## 2018-07-08 DIAGNOSIS — X58XXXA Exposure to other specified factors, initial encounter: Secondary | ICD-10-CM | POA: Diagnosis present

## 2018-07-08 DIAGNOSIS — Z96652 Presence of left artificial knee joint: Secondary | ICD-10-CM | POA: Diagnosis present

## 2018-07-08 DIAGNOSIS — J9601 Acute respiratory failure with hypoxia: Secondary | ICD-10-CM

## 2018-07-08 DIAGNOSIS — Z6841 Body Mass Index (BMI) 40.0 and over, adult: Secondary | ICD-10-CM

## 2018-07-08 DIAGNOSIS — I6529 Occlusion and stenosis of unspecified carotid artery: Secondary | ICD-10-CM | POA: Diagnosis present

## 2018-07-08 DIAGNOSIS — Z8249 Family history of ischemic heart disease and other diseases of the circulatory system: Secondary | ICD-10-CM

## 2018-07-08 DIAGNOSIS — Z87891 Personal history of nicotine dependence: Secondary | ICD-10-CM

## 2018-07-08 DIAGNOSIS — Z87442 Personal history of urinary calculi: Secondary | ICD-10-CM

## 2018-07-08 DIAGNOSIS — K219 Gastro-esophageal reflux disease without esophagitis: Secondary | ICD-10-CM | POA: Diagnosis present

## 2018-07-08 DIAGNOSIS — Z978 Presence of other specified devices: Secondary | ICD-10-CM | POA: Diagnosis not present

## 2018-07-08 DIAGNOSIS — Z79899 Other long term (current) drug therapy: Secondary | ICD-10-CM

## 2018-07-08 DIAGNOSIS — M1990 Unspecified osteoarthritis, unspecified site: Secondary | ICD-10-CM | POA: Diagnosis present

## 2018-07-08 DIAGNOSIS — S82891A Other fracture of right lower leg, initial encounter for closed fracture: Secondary | ICD-10-CM | POA: Diagnosis not present

## 2018-07-08 DIAGNOSIS — G8929 Other chronic pain: Secondary | ICD-10-CM | POA: Diagnosis not present

## 2018-07-08 DIAGNOSIS — I5022 Chronic systolic (congestive) heart failure: Secondary | ICD-10-CM | POA: Diagnosis present

## 2018-07-08 DIAGNOSIS — T380X5A Adverse effect of glucocorticoids and synthetic analogues, initial encounter: Secondary | ICD-10-CM | POA: Diagnosis not present

## 2018-07-08 DIAGNOSIS — D72829 Elevated white blood cell count, unspecified: Secondary | ICD-10-CM | POA: Diagnosis not present

## 2018-07-08 DIAGNOSIS — R011 Cardiac murmur, unspecified: Secondary | ICD-10-CM | POA: Diagnosis present

## 2018-07-08 DIAGNOSIS — D496 Neoplasm of unspecified behavior of brain: Secondary | ICD-10-CM | POA: Diagnosis not present

## 2018-07-08 DIAGNOSIS — Z833 Family history of diabetes mellitus: Secondary | ICD-10-CM

## 2018-07-08 DIAGNOSIS — Z79891 Long term (current) use of opiate analgesic: Secondary | ICD-10-CM

## 2018-07-08 DIAGNOSIS — Z9911 Dependence on respirator [ventilator] status: Secondary | ICD-10-CM

## 2018-07-08 DIAGNOSIS — Z809 Family history of malignant neoplasm, unspecified: Secondary | ICD-10-CM

## 2018-07-08 DIAGNOSIS — J9600 Acute respiratory failure, unspecified whether with hypoxia or hypercapnia: Secondary | ICD-10-CM | POA: Diagnosis not present

## 2018-07-08 DIAGNOSIS — D320 Benign neoplasm of cerebral meninges: Principal | ICD-10-CM | POA: Diagnosis present

## 2018-07-08 DIAGNOSIS — S82831A Other fracture of upper and lower end of right fibula, initial encounter for closed fracture: Secondary | ICD-10-CM | POA: Diagnosis not present

## 2018-07-08 LAB — CBC
HCT: 40 % (ref 36.0–46.0)
Hemoglobin: 13.6 g/dL (ref 12.0–15.0)
MCH: 33.4 pg (ref 26.0–34.0)
MCHC: 34 g/dL (ref 30.0–36.0)
MCV: 98.3 fL (ref 78.0–100.0)
Platelets: 241 10*3/uL (ref 150–400)
RBC: 4.07 MIL/uL (ref 3.87–5.11)
RDW: 11.5 % (ref 11.5–15.5)
WBC: 10.3 10*3/uL (ref 4.0–10.5)

## 2018-07-08 LAB — COMPREHENSIVE METABOLIC PANEL
ALT: 18 U/L (ref 0–44)
AST: 21 U/L (ref 15–41)
Albumin: 3.4 g/dL — ABNORMAL LOW (ref 3.5–5.0)
Alkaline Phosphatase: 51 U/L (ref 38–126)
Anion gap: 12 (ref 5–15)
BUN: 12 mg/dL (ref 8–23)
CO2: 24 mmol/L (ref 22–32)
Calcium: 8.9 mg/dL (ref 8.9–10.3)
Chloride: 103 mmol/L (ref 98–111)
Creatinine, Ser: 1.05 mg/dL — ABNORMAL HIGH (ref 0.44–1.00)
GFR calc Af Amer: 60 mL/min — ABNORMAL LOW (ref >60)
GFR calc non Af Amer: 52 mL/min — ABNORMAL LOW (ref >60)
Glucose, Bld: 169 mg/dL — ABNORMAL HIGH (ref 70–99)
Potassium: 4.2 mmol/L (ref 3.5–5.1)
Sodium: 139 mmol/L (ref 135–145)
Total Bilirubin: 1.3 mg/dL — ABNORMAL HIGH (ref 0.3–1.2)
Total Protein: 6.3 g/dL — ABNORMAL LOW (ref 6.5–8.1)

## 2018-07-08 LAB — POCT I-STAT 3, ART BLOOD GAS (G3+)
Acid-Base Excess: 1 mmol/L (ref 0.0–2.0)
Bicarbonate: 25 mmol/L (ref 20.0–28.0)
O2 Saturation: 100 %
Patient temperature: 36.6
TCO2: 26 mmol/L (ref 22–32)
pCO2 arterial: 37.8 mmHg (ref 32.0–48.0)
pH, Arterial: 7.426 (ref 7.350–7.450)
pO2, Arterial: 165 mmHg — ABNORMAL HIGH (ref 83.0–108.0)

## 2018-07-08 LAB — BASIC METABOLIC PANEL
Anion gap: 9 (ref 5–15)
BUN: 21 mg/dL (ref 8–23)
CO2: 24 mmol/L (ref 22–32)
Calcium: 8.6 mg/dL — ABNORMAL LOW (ref 8.9–10.3)
Chloride: 107 mmol/L (ref 98–111)
Creatinine, Ser: 0.86 mg/dL (ref 0.44–1.00)
GFR calc Af Amer: >60 mL/min (ref >60)
GFR calc non Af Amer: >60 mL/min (ref >60)
Glucose, Bld: 167 mg/dL — ABNORMAL HIGH (ref 70–99)
Potassium: 3.9 mmol/L (ref 3.5–5.1)
Sodium: 140 mmol/L (ref 135–145)

## 2018-07-08 LAB — PROCALCITONIN: Procalcitonin: <0.1 ng/mL

## 2018-07-08 LAB — GLUCOSE, CAPILLARY
Glucose-Capillary: 119 mg/dL — ABNORMAL HIGH (ref 70–99)
Glucose-Capillary: 120 mg/dL — ABNORMAL HIGH (ref 70–99)
Glucose-Capillary: 124 mg/dL — ABNORMAL HIGH (ref 70–99)
Glucose-Capillary: 129 mg/dL — ABNORMAL HIGH (ref 70–99)
Glucose-Capillary: 155 mg/dL — ABNORMAL HIGH (ref 70–99)
Glucose-Capillary: 174 mg/dL — ABNORMAL HIGH (ref 70–99)

## 2018-07-08 LAB — MAGNESIUM: Magnesium: 1.8 mg/dL (ref 1.7–2.4)

## 2018-07-08 LAB — MRSA PCR SCREENING: MRSA by PCR: NEGATIVE

## 2018-07-08 LAB — TSH: TSH: 1.032 u[IU]/mL (ref 0.350–4.500)

## 2018-07-08 MED ORDER — HEPARIN SODIUM (PORCINE) 5000 UNIT/ML IJ SOLN
5000.0000 [IU] | Freq: Three times a day (TID) | INTRAMUSCULAR | Status: DC
  Administered 2018-07-08 – 2018-07-13 (×16): 5000 [IU] via SUBCUTANEOUS
  Filled 2018-07-08 (×16): qty 1

## 2018-07-08 MED ORDER — FENTANYL CITRATE (PF) 100 MCG/2ML IJ SOLN
50.0000 ug | INTRAMUSCULAR | Status: DC | PRN
  Administered 2018-07-08 (×2): 50 ug via INTRAVENOUS
  Filled 2018-07-08: qty 2

## 2018-07-08 MED ORDER — CHLORHEXIDINE GLUCONATE 0.12% ORAL RINSE (MEDLINE KIT)
15.0000 mL | Freq: Two times a day (BID) | OROMUCOSAL | Status: DC
  Administered 2018-07-08 – 2018-07-11 (×6): 15 mL via OROMUCOSAL

## 2018-07-08 MED ORDER — PRO-STAT SUGAR FREE PO LIQD
30.0000 mL | Freq: Two times a day (BID) | ORAL | Status: DC
  Administered 2018-07-08 – 2018-07-09 (×2): 30 mL
  Filled 2018-07-08 (×2): qty 30

## 2018-07-08 MED ORDER — DEXAMETHASONE SODIUM PHOSPHATE 4 MG/ML IJ SOLN
4.0000 mg | Freq: Four times a day (QID) | INTRAMUSCULAR | Status: DC
  Administered 2018-07-08 – 2018-07-13 (×21): 4 mg via INTRAVENOUS
  Filled 2018-07-08 (×21): qty 1

## 2018-07-08 MED ORDER — FENTANYL CITRATE (PF) 100 MCG/2ML IJ SOLN
50.0000 ug | INTRAMUSCULAR | Status: DC | PRN
  Administered 2018-07-09: 50 ug via INTRAVENOUS
  Filled 2018-07-08 (×3): qty 2

## 2018-07-08 MED ORDER — PANTOPRAZOLE SODIUM 40 MG PO PACK
40.0000 mg | PACK | Freq: Every day | ORAL | Status: DC
  Administered 2018-07-09: 40 mg
  Filled 2018-07-08: qty 20

## 2018-07-08 MED ORDER — LEVETIRACETAM IN NACL 1000 MG/100ML IV SOLN
1000.0000 mg | Freq: Two times a day (BID) | INTRAVENOUS | Status: DC
  Administered 2018-07-08 – 2018-07-10 (×5): 1000 mg via INTRAVENOUS
  Filled 2018-07-08 (×7): qty 100

## 2018-07-08 MED ORDER — SODIUM CHLORIDE 0.9 % IV SOLN
250.0000 mL | INTRAVENOUS | Status: DC | PRN
Start: 2018-07-08 — End: 2018-07-13
  Administered 2018-07-08: 250 mL via INTRAVENOUS
  Administered 2018-07-09: 10 mL via INTRAVENOUS

## 2018-07-08 MED ORDER — ALBUTEROL SULFATE (2.5 MG/3ML) 0.083% IN NEBU: 2.5000 mg | INHALATION_SOLUTION | RESPIRATORY_TRACT | Status: DC | PRN

## 2018-07-08 MED ORDER — MAGNESIUM SULFATE 2 GM/50ML IV SOLN
2.0000 g | Freq: Once | INTRAVENOUS | Status: AC
  Administered 2018-07-08: 2 g via INTRAVENOUS
  Filled 2018-07-08: qty 50

## 2018-07-08 MED ORDER — MIDAZOLAM HCL 2 MG/2ML IJ SOLN
1.0000 mg | INTRAMUSCULAR | Status: DC | PRN
  Administered 2018-07-08 – 2018-07-09 (×2): 1 mg via INTRAVENOUS
  Filled 2018-07-08 (×2): qty 2

## 2018-07-08 MED ORDER — INSULIN ASPART 100 UNIT/ML ~~LOC~~ SOLN
0.0000 [IU] | SUBCUTANEOUS | Status: DC
  Administered 2018-07-08: 3 [IU] via SUBCUTANEOUS
  Administered 2018-07-08 – 2018-07-09 (×3): 2 [IU] via SUBCUTANEOUS
  Administered 2018-07-09 (×2): 3 [IU] via SUBCUTANEOUS
  Administered 2018-07-09: 2 [IU] via SUBCUTANEOUS
  Administered 2018-07-09 – 2018-07-10 (×2): 3 [IU] via SUBCUTANEOUS
  Administered 2018-07-10: 5 [IU] via SUBCUTANEOUS
  Administered 2018-07-10: 3 [IU] via SUBCUTANEOUS
  Administered 2018-07-10 – 2018-07-11 (×4): 2 [IU] via SUBCUTANEOUS
  Administered 2018-07-11: 3 [IU] via SUBCUTANEOUS
  Administered 2018-07-11: 2 [IU] via SUBCUTANEOUS
  Administered 2018-07-11: 3 [IU] via SUBCUTANEOUS
  Administered 2018-07-11: 5 [IU] via SUBCUTANEOUS
  Administered 2018-07-12 (×3): 2 [IU] via SUBCUTANEOUS
  Administered 2018-07-12: 5 [IU] via SUBCUTANEOUS
  Administered 2018-07-12: 3 [IU] via SUBCUTANEOUS
  Administered 2018-07-12 – 2018-07-13 (×2): 2 [IU] via SUBCUTANEOUS
  Administered 2018-07-13: 3 [IU] via SUBCUTANEOUS
  Administered 2018-07-13: 2 [IU] via SUBCUTANEOUS
  Administered 2018-07-13: 3 [IU] via SUBCUTANEOUS

## 2018-07-08 MED ORDER — MIDAZOLAM HCL 2 MG/2ML IJ SOLN
1.0000 mg | INTRAMUSCULAR | Status: DC | PRN
  Administered 2018-07-08 (×2): 1 mg via INTRAVENOUS
  Filled 2018-07-08 (×3): qty 2

## 2018-07-08 MED ORDER — GADOBUTROL 1 MMOL/ML IV SOLN
10.0000 mL | Freq: Once | INTRAVENOUS | Status: AC | PRN
  Administered 2018-07-08: 10 mL via INTRAVENOUS

## 2018-07-08 MED ORDER — ORAL CARE MOUTH RINSE
15.0000 mL | OROMUCOSAL | Status: DC
  Administered 2018-07-08 – 2018-07-09 (×13): 15 mL via OROMUCOSAL

## 2018-07-08 MED ORDER — VITAL HIGH PROTEIN PO LIQD
1000.0000 mL | ORAL | Status: DC
  Administered 2018-07-08: 1000 mL
  Filled 2018-07-08: qty 1000

## 2018-07-08 NOTE — Progress Notes (Signed)
Sputum culture collected. Sent to lab.  

## 2018-07-08 NOTE — Progress Notes (Signed)
Pt transported to MRI via ventilator with no complications noted.  

## 2018-07-08 NOTE — H&P (Signed)
Alexis Edwards  HER:[id number] DOB: 04-13-1946 DOA: 07/08/2018 PCP: Leonides Sake, MD    LOS: 0 days   Reason for Consult / Chief Complaint:  Mass of brain with surrounding edema Intubated  Consulting MD and date:  Ulla Gallo- EDP at [facility]   HPI/Summary of hospital stay:  72 yr old female with PMHx significant for CHF, previous Takotsubo episode in 2010,  Depression, HLD, HTN, Obesity, Obstructive Sleep Apnea presented to [facility] ED on 07/07/18 with chief complaint of altered mental status. Per ED documentation pt's family reports that around noon she was confused and by 1600 she had her first episode of generalized tonic clonic seizure for 1 min duration. EMS was called and on arrival pt was post ictal followed by moments of confusion mixed with episodes of clarity. The second  witnessed episode of seizure occurred at 17:52 in [place] ED and was described as staring to the right with jerking of all extremities and face. At 19:07 pt was noted to be more somnolent with sonorous respirations and difficult to arouse and a decision was made to intubate.    Vitals in [place] ED:  BP ranged from 137/16mmHg to 166/47mmHg  RR12-18 O2 Sat 93-100% and Temp 98.6 to 99.2 deg F   Subjective:  07/08/2018  - remains intubaed. Not on pressors. On versed and fent prn. Fio2 50%. Per RN significant brain edema with 45mm shift. No seizures. 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 rate 20, height 5\' 2"  (1.575 m), weight 104.6 kg, SpO2 100 %.    Vent Mode: PRVC FiO2 (%):  [50 %-60 %] 50 % Set Rate:  [18 bmp] 18 bmp Vt Set:  [420 mL] 420 mL PEEP:  [5 cmH20] 5 cmH20 Plateau Pressure:  [13 cmH20] 13 cmH20   Intake/Output Summary (Last 24 hours) at 07/08/2018 1253 Last data  filed at 07/08/2018 1200 Gross per 24 hour  Intake 223.3 ml  Output 550 ml  Net -326.7 ml   Filed Weights   07/08/18 0000  Weight: 104.6 kg   General Appearance:  Looks criticall ill  On vent. Obese Head:  Normocephalic, without obvious abnormality, atraumatic Eyes:  PERRL - yes, conjunctiva/corneas - clear     Ears:  Normal external ear canals, both ears Nose:  G tube - no Throat:  ETT TUBE - yes , OG tube - yes Neck:  Supple,  No enlargement/tenderness/nodules Lungs: Clear to auscultation bilaterally, Ventilator   Synchrony - yes Heart:  S1 and S2 normal, no murmur, CVP - no.  Pressors - no Abdomen:  Soft, no masses, no organomegaly Genitalia / Rectal:  Not done Extremities:  Extremities- intact Skin:  ntact in exposed areas . Neurologic:  Sedation - prn -> RASS - -3 . Moves all 4s - yes. CAM-ICU - na . Orientation - na but gqve thumbs up on command       LABS    PULMONARY Recent Labs  Lab 07/08/18 0107  PHART 7.426  PCO2ART 37.8  PO2ART 165.0*  HCO3 25.0  TCO2 26  O2SAT 100.0    CBC Recent Labs  Lab 07/08/18 0138  HGB 13.6  HCT 40.0  WBC 10.3  PLT 241    COAGULATION No results for input(s): INR in the last 168 hours.  CARDIAC  No results for input(s): TROPONINI in the last 168 hours. No results for input(s): PROBNP in the last 168 hours.   CHEMISTRY Recent Labs  Lab 07/08/18 0138  NA 139  K 4.2  CL 103  CO2 24  GLUCOSE 169*  BUN 12  CREATININE 1.05*  CALCIUM 8.9  MG 1.8   Estimated Creatinine Clearance: 55 mL/min (A) (by C-G formula based on SCr of 1.05 mg/dL (H)).   LIVER Recent Labs  Lab 07/08/18 0138  AST 21  ALT 18  ALKPHOS 51  BILITOT 1.3*  PROT 6.3*  ALBUMIN 3.4*     INFECTIOUS Recent Labs  Lab 07/08/18 0138  PROCALCITON <0.10     ENDOCRINE CBG (last 3)  Recent Labs    07/08/18 0407 07/08/18 0813 07/08/18 1157  GLUCAP 155* 119* 129*         IMAGING x48h  - image(s) personally visualized  -    highlighted in bold Mr Brain W Wo Contrast  Result Date: 07/08/2018 CLINICAL DATA:  Brain mass. EXAM: MRI HEAD WITHOUT AND WITH CONTRAST TECHNIQUE: Multiplanar, multiecho pulse sequences of the brain and surrounding structures were obtained without and with intravenous contrast. CONTRAST:  10 cc Gadavist intravenous COMPARISON:  Head CT from yesterday FINDINGS: Brain: 28 x 32 x 24 mm avidly enhancing mass along the high and anterior left frontal convexity. This is favored to be extra-axial and this is primarily based on apparent claw sign with cortex buckled anterior and posterior to the mass on sagittal T1 weighted imaging. Deep to the mass a cortical rim or CSF cleft is not visualized but this may be related to compression. There is suggestion of a dural tail on postcontrast imaging. The mass has central vessels and is primarily isointense to cortex on T2 weighted imaging. No second mass is seen. Vasogenic edema is with midline shift and subfalcine herniation. No entrapment. No ACA infarct. Vascular: Major flow voids and vascular enhancements are preserved. Skull and upper cervical spine: No evidence of marrow lesion Sinuses/Orbits: Negative IMPRESSION: 1. 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 2. Severe vasogenic edema with subfalcine herniation. Electronically Signed   By: Monte Fantasia M.D.   On: 07/08/2018 10:47   Dg Chest Port 1 View  Result Date: 07/08/2018 CLINICAL DATA:  Life support line placement. EXAM: PORTABLE CHEST 1 VIEW COMPARISON:  Chest radiograph July 07, 2018 at 1946 hours. FINDINGS: Endotracheal tube tip projects 18 mm above the carina. Nasogastric tube past GE junction, distal tip out of field of view. Mild cardiomegaly. Pulmonary vascular congestion and interstitial prominence. Low inspiratory examination with bibasilar strandy densities. Small suspected LEFT pleural effusion. No pneumothorax. LEFT shoulder arthroplasty. IMPRESSION:  Endotracheal tube tip projects 1.8 cm above the carina. Mild cardiomegaly with mild interstitial prominence, potentially related to low inspiratory examination. Bibasilar atelectasis and small suspected LEFT pleural effusion. Electronically Signed   By: Elon Alas M.D.   On: 07/08/2018 00:53   Dg Abd Portable 1v  Result Date: 07/08/2018 CLINICAL DATA:  Orogastric tube placement. EXAM: PORTABLE ABDOMEN - 1 VIEW COMPARISON:  Abdominal radiograph June 29, 2018 FINDINGS: Nasogastric tube tip projects in mid stomach. Partially imaged LEFT nephroureteral stent. Surgical clips in the included right abdomen compatible with cholecystectomy. Included bowel gas pattern is nondilated and nonobstructive. Linear lucency LEFT mid abdomen. IMPRESSION: 1. Nasogastric tube tip projecting in mid abdomen. LEFT nephroureteral stent. 2. Linear lucency LEFT abdomen, potentially external to the patient though, subcutaneous gas not excluded.  3. Nonspecific bowel gas pattern. Electronically Signed   By: Elon Alas M.D.   On: 07/08/2018 00:54     Recent Results (from the past 240 hour(s))  MRSA PCR Screening     Status: None   Collection Time: 07/08/18 12:33 AM  Result Value Ref Range Status   MRSA by PCR NEGATIVE NEGATIVE Final    Comment:        The GeneXpert MRSA Assay (FDA approved for NASAL specimens only), is one component of a comprehensive MRSA colonization surveillance program. It is not intended to diagnose MRSA infection nor to guide or monitor treatment for MRSA infections. Performed at [facility], [place] [address] [place], [place] [address]   Culture, respiratory (non-expectorated)     Status: None (Preliminary result)   Collection Time: 07/08/18  1:08 AM  Result Value Ref Range Status   Specimen Description TRACHEAL ASPIRATE  Final   Special Requests NONE  Final   Gram Stain   Final    NO WBC SEEN RARE GRAM POSITIVE COCCI Performed at [facility], [place] [address] [place],  [address]    Culture PENDING  Incomplete   Report Status PENDING  Incomplete     Consults: date of consult/date signed off & final recs:   Neurology: Cheral Marker MD awaiting recs  Procedures: None at this time  Significant Diagnostic Tests: CT Head  FINDINGS: Brain: Left anterior frontal mass lesion measuring 2.1 x 3.3 x 3.3 cm (AP x ML x CC series 2, image 13 and series 401, image 43). The mass has a large dural base and associated hypertrophy of the inner table of calvarium. There is mass effect on the adjacent brain and extensive edema throughout the left frontal lobe extending into the anterior basal ganglia and crossing genu of corpus callosum to the contralateral right frontal lobe. Mass effect from the lesion and edema results in 11 mm of left-to-right midline shift and partial effacement of the frontal horns of lateral ventricles. No downward herniation. No acute stroke or hemorrhage identified.  Vascular: Calcific atherosclerosis of the carotid siphons. No hyperdense vessels identified. Skull: Normal. Negative for fracture or focal lesion. Sinuses/Orbits: No acute finding. Other: None. IMPRESSION: Left anterior frontal mass lesion measuring 3.3 cm, findings favor a dural base meningioma, but dural metastasis or cortical brain lesion is possible. MRI of the brain with and without contrast is recommended to further characterize. Extensive edema throughout the adjacent left frontal lobe. Associated mass effect with 11 mm of left-to-right midline shift.  Micro Data: Blood cx x 2 pending   Antimicrobials:  Not currently on antibiotics  Resolved Hospital Problem list    Assessment & Plan:   NEURO: Altered mental status with new onset seizures secondary to Left anterior frontal mass lesion 3.3cm with extensive edema Loaded with AED- Keppra post seizure activity continue with Keppra 1000mg  BID  07/08/2018  - follows commands prn sedtaion. RASS still  -3  PLAN Per neuro   CARDIAC: Hemodynamically stable H/o CHF Last EF 65-70% EKG QTc 424 PR 170 low voltage QRS Cardiac Panel (last 3 results)   - 07/08/2018 - nil acute  PLAN  - monitor  PULMONARY: Acute hypoxic respiratory failure in the setting of acute encephalopathy - Intubated on Mechanical ventilation   07/08/2018 - > does not meet criteria for SBT/Extubation in setting of Acute Respiratory Failure due to acute obtundation  Plan Full vent support   ID: No active signs of infection   plan  Hold antibiotics at this time   Endocrine: Check TSH No H/o insulin dependence BG currently 169mg /dl  Continue to monitor if BG >180mg /dl check Hgb A1c and start on ICU glycemic protocol   GI: NPO H/o diverticulosis  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 06/28/18 per Family OGT- in place, evaluated with KUB projecting into mid abdomen  PLANB Start TF GI PPx started  Heme: Stable  Plan - PRBC for hgb </= 6.9gm%    - exceptions are   -  if ACS susepcted/confirmed then transfuse for hgb </= 8.0gm%,  or    -  active bleeding with hemodynamic instability, then transfuse regardless of hemoglobin value   At at all times try to transfuse 1 unit prbc as possible with exception of active hemorrhage    RENAL H/o Left nephroureteral stent Placed During surgery on 06/28/18 for obstructing stone at [facility]- per family Abdominal incision intact with staples healing well Creat 1.05 from 1.01   Plan monitor   Disposition / Summary of Today's Plan 07/08/18   Keep in ICU.     DVT prophylaxis: SCDs GI prophylaxis: Protonix Diet: NPO Mobility:Bed rest Code Status: FULL Family Communication: son and grandaughter updated.      ATTESTATION & SIGNATURE   The patient is critically ill with multiple organ systems failure and requires high complexity decision making for assessment and support, frequent evaluation and titration of therapies, application of advanced  monitoring technologies and extensive interpretation of multiple databases.   Critical Care Time devoted to patient care services described in this note is  30  Minutes. This time reflects time of care of this signee Dr Brand Males. This critical care time does not reflect procedure time, or teaching time or supervisory time of PA/NP/Med student/Med Resident etc but could involve care discussion time     Dr. Brand Males, M.D., [facility].C.P Pulmonary and Critical Care Medicine Staff Physician [facility] Pager: [phone], If no answer or between  15:00h - 7:00h: call [phone]  07/08/2018 1:05 PM

## 2018-07-08 NOTE — Consult Note (Signed)
Neurosurgery Consultation  Reason for Consult: Brain tumor, seizures Referring Physician: Scatliffe  CC: Siezures  HPI: This is a 72 y.o. woman that presents with status epilepticus. She had what sound like focal seizures that generalized and then did not return to baseline in between seizures, c/w status. 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 Per her family, her hospitalization and surgical course were uncomplicated except for requiring placement of a ureteral stent. Further history limited due to patient being intubated.  ROS: A 14 point ROS was performed and is negative except as noted in the HPI.   PMHx:  Past Medical History:  Diagnosis Date  . Arthritis   . CHF (congestive heart failure) ([place])   . COPD (chronic obstructive pulmonary disease) ([place])   . Depression   . Dyslipidemia   . GERD (gastroesophageal reflux disease)   . Heart murmur   . History of kidney stones   . HTN (hypertension)   . Hyperlipidemia   . Obesity    BMI 55  . Pneumonia    hx  . Sleep apnea    does not use cpap  . Sleep apnea   . Takotsubo syndrome April 2013   apical ballooning, [facility]- [place]   FamHx:  Family History  Problem Relation Age of Onset  . Coronary artery disease Brother 50       stent  . Diabetes Mother   . Hypertension Mother   . Cancer Mother   . Coronary artery disease Sister   . Diabetes Sister   . Cancer Sister    SocHx:  reports that she quit smoking about 31 years ago. Her smoking use included cigarettes. She has a 20.00 pack-year smoking history. She has never used smokeless tobacco. She reports that she drinks alcohol. She reports that she does not use drugs.  Exam: Vital signs in last 24 hours: Temp:  [97.3 F (36.3 C)-98.6 F (37 C)] 97.5 F (36.4 C) (09/15 0800) Pulse Rate:  [57-76] 60 (09/15 0800) Resp:  [16-21] 18 (09/15 0800) BP: (112-148)/(45-122) 125/113 (09/15 0800) SpO2:  [97 %-100 %] 99 % (09/15  0800) FiO2 (%):  [50 %-60 %] 50 % (09/15 0410) Weight:  [104.6 kg] 104.6 kg (09/15 0000) General: Intubated, lying in hospital bed Head: normocephalic and atruamatic HEENT: neck supple Pulmonary: intubated, symmetric chest rise bilaterally Cardiac: RRR Abdomen: obese S NT ND, incision c/d/i Extremities: warm and well perfused x4 Neuro: Intubated, eyes open to voice, FCx4 with grossly symmetric strength  Assessment and Plan: 72 y.o. woman with status epilepticus, intubated. 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  -no acute neurosurgical intervention. If patient's seizures are controlled with medication, will delay surgery for a few weeks to allow her to recover from her recent surgery and this hospital stay -continue dex 4q6 x24h, can decrease to 4bid thereafter if blood sugars well controlled on that dose -discussed plan of care with family -please call with any concerns or questions  Judith Part, MD 07/08/18 10:05 AM [facility]

## 2018-07-08 NOTE — H&P (Addendum)
Alexis Edwards  FIE:[id number] DOB: [birth year] DOA: 07/08/2018 PCP: Leonides Sake, MD    LOS: 0 days   Reason for Consult / Chief Complaint:  Mass of brain with surrounding edema Intubated  Consulting MD and date:  Ulla Gallo- EDP at [facility]   HPI/Summary of hospital stay:  72 yr old female with PMHx significant for CHF, previous Takotsubo episode in 2010,  Depression, HLD, HTN, Obesity, Obstructive Sleep Apnea presented to [facility] ED on 07/07/18 with chief complaint of altered mental status. Per ED documentation pt's family reports that around noon she was confused and by 1600 she had her first episode of generalized tonic clonic seizure for 1 min duration. EMS was called and on arrival pt was post ictal followed by moments of confusion mixed with episodes of clarity. The second  witnessed episode of seizure occurred at 17:52 in [place] ED and was described as staring to the right with jerking of all extremities and face. At 19:07 pt was noted to be more somnolent with sonorous respirations and difficult to arouse and a decision was made to intubate.    Vitals in [facility] ED:  BP ranged from 137/44mmHg to 166/51mmHg  RR12-18 O2 Sat 93-100% and Temp 98.6 to 99.2 deg F   Subjective:  Patient currently intubated. In no apparent distress awake and responds to name.   Objective   Pulse 63, temperature 98.6 F (37 C), temperature source Core (Comment), resp. rate 20, height 5\' 2"  (1.575 m), weight 104.6 kg, SpO2 100 %.    Vent Mode: PRVC FiO2 (%):  [60 %] 60 % Set Rate:  [18 bmp] 18 bmp Vt Set:  [420 mL] 420 mL PEEP:  [5 cmH20] 5 cmH20 Plateau Pressure:  [13 cmH20] 13 cmH20  No intake or output data in the 24 hours ending 07/08/18 0212 Filed Weights   07/08/18 0000  Weight: 104.6 kg    Examination: General: intubated  HENT: NCAT ETT in oropharynx Lungs: no rhonci no wheezes decreased breath sounds at left base Cardiovascular: S1 and S2 appreciated with no  appreciable murmurs Abdomen: soft non tender + BS abdominal incision noted with staples healing well no evidence of dehisence Extremities: SCDs in place, no edema noted in lower ext  upper ext soft mitts b/l Neuro: not on continuous sedation, opens eyes to name , follows commands GU: foley catheter in place  Consults: date of consult/date signed off & final recs:   Neurology: Cheral Marker MD awaiting recs  Procedures: None at this time  Significant Diagnostic Tests: CT Head  FINDINGS: Brain: Left anterior frontal mass lesion measuring 2.1 x 3.3 x 3.3 cm (AP x ML x CC series 2, image 13 and series 401, image 43). The mass has a large dural base and associated hypertrophy of the inner table of calvarium. There is mass effect on the adjacent brain and extensive edema throughout the left frontal lobe extending into the anterior basal ganglia and crossing genu of corpus callosum to the contralateral right frontal lobe. Mass effect from the lesion and edema results in 11 mm of left-to-right midline shift and partial effacement of the frontal horns of lateral ventricles. No downward herniation. No acute stroke or hemorrhage identified.  Vascular: Calcific atherosclerosis of the carotid siphons. No hyperdense vessels identified. Skull: Normal. Negative for fracture or focal lesion. Sinuses/Orbits: No acute finding. Other: None. IMPRESSION: Left anterior frontal mass lesion measuring 3.3 cm, findings favor a dural base meningioma, but dural metastasis or cortical  brain lesion is possible. MRI of the brain with and without contrast is recommended to further characterize. Extensive edema throughout the adjacent left frontal lobe. Associated mass effect with 11 mm of left-to-right midline shift.  Micro Data: Blood cx x 2 pending   Antimicrobials:  Not currently on antibiotics  Resolved Hospital Problem list    Assessment & Plan:   NEURO: Altered mental status with new onset seizures  secondary to Left anterior frontal mass lesion 3.3cm with extensive edema Not on continuous sedation MRI pending Loaded with AED- Keppra post seizure activity continue with Keppra 1000mg  BID Discontinue all medications that lower seizure threshold Decadron IV started at outside facility Neuro consulted Neurosurgery consulted Will f/u their recommendations  CARDIAC: Hemodynamically stable H/o CHF Last EF 65-70% EKG QTc 424 PR 170 low voltage QRS Cardiac Panel (last 3 results)  PULMONARY: Acute hypoxic respiratory failure in the setting of acute encephalopathy Intubated on Mechanical ventilation ETT read as 1.8 cm above the carina will pull back CXR shows Mild cardiomegaly with mild interstitial prominence, potentially related to low inspiratory examination.  Bibasilar atelectasis and small suspected LEFT pleural effusion TV 8cc/kg  FiO2 60% ( titrate to keep Sat >92%) with PEEP @ 5 ABG: 7.426/37/165/25 VAP Aspiration precautions Daily sedation vacation with SBT when appropiate  ID: No active signs of infection WBC 10.3 Afebrile No cultures drawn at [facility] Blood cx x2 drawn on arrival  Hold antibiotics at this time Trend WBC, and fever curve MRSA PCR - pending  Endocrine: Check TSH No H/o insulin dependence BG currently 169mg /dl  Continue to monitor if BG >180mg /dl check Hgb A1c and start on ICU glycemic protocol   GI: NPO H/o diverticulosis  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 06/28/18 per Family OGT- in place, evaluated with KUB projecting into mid abdomen Currently not on TF GI PPx started  Heme: hemoglobin 13.6 platelets 241 If Hgb<7 transfuse PRBCs .Jenetta Downer POS No signs of Any active bleeding No h/o coagulopathy DVT PPx-> if not why is it not given  RENAL H/o Left nephroureteral stent Placed During surgery on 06/28/18 for obstructing stone at [facility]- per family Abdominal incision intact with staples healing well Creat 1.05 from 1.01 Lab Results    Component Value Date   CREATININE 1.01 (H) 02/25/2017   CREATININE 1.03 (H) 02/15/2017   CREATININE 1.13 (H) 06/21/2013  Goal electrolytes: K= 4 Mg 2 Phos 2    Disposition / Summary of Today's Plan 07/08/18   Admitted to ICU at [facility]    DVT prophylaxis: SCDs GI prophylaxis: Protonix Diet: NPO Mobility:Bed rest Code Status: FULL Family Communication: at bedside and explained clinical condition and current care plan in detail  Labs   CBC: No results for input(s): WBC, NEUTROABS, HGB, HCT, MCV, PLT in the last 168 hours. Basic Metabolic Panel: No results for input(s): NA, K, CL, CO2, GLUCOSE, BUN, CREATININE, CALCIUM, MG, PHOS in the last 168 hours. GFR: CrCl cannot be calculated (Patient's most recent lab result is older than the maximum 21 days allowed.). No results for input(s): PROCALCITON, WBC, LATICACIDVEN in the last 168 hours. Liver Function Tests: No results for input(s): AST, ALT, ALKPHOS, BILITOT, PROT, ALBUMIN in the last 168 hours. No results for input(s): LIPASE, AMYLASE in the last 168 hours. No results for input(s): AMMONIA in the last 168 hours. ABG    Component Value Date/Time   PHART 7.426 07/08/2018 0107   PCO2ART 37.8 07/08/2018 0107   PO2ART 165.0 (H) 07/08/2018 0107   HCO3 25.0 07/08/2018  0107   TCO2 26 07/08/2018 0107   O2SAT 100.0 07/08/2018 0107    Coagulation Profile: No results for input(s): INR, PROTIME in the last 168 hours. Cardiac Enzymes: No results for input(s): CKTOTAL, CKMB, CKMBINDEX, TROPONINI in the last 168 hours. HbA1C: No results found for: HGBA1C CBG: No results for input(s): GLUCAP in the last 168 hours.   Review of Systems:   Marland KitchenMarland KitchenReview of Systems  Unable to perform ROS: Critical illness   Pt intubated and acute encephalopathy w/ brain mass s/p seizures on sedation Past medical history  She,  has a past medical history of Arthritis, CHF (congestive heart failure) ([place]), COPD (chronic obstructive pulmonary  disease) ([place]), Depression, Dyslipidemia, GERD (gastroesophageal reflux disease), Heart murmur, History of kidney stones, HTN (hypertension), Hyperlipidemia, Obesity, Pneumonia, Sleep apnea, Sleep apnea, and Takotsubo syndrome (April 2013).   Surgical History    Past Surgical History:  Procedure Laterality Date  . ABDOMINAL HYSTERECTOMY    . CARDIAC CATHETERIZATION    . CHOLECYSTECTOMY  2/10  . REVERSE SHOULDER ARTHROPLASTY Left 02/24/2017   Procedure: REVERSE LEFT SHOULDER ARTHROPLASTY;  Surgeon: Netta Cedars, MD;  Location: [facility];  Service: Orthopedics;  Laterality: Left;  . TOTAL KNEE ARTHROPLASTY  5/12   Lt     Social History   Social History   Socioeconomic History  . Marital status: Married    Spouse name: Not on file  . Number of children: Not on file  . Years of education: Not on file  . Highest education level: Not on file  Occupational History  . Not on file  Social Needs  . Financial resource strain: Not on file  . Food insecurity:    Worry: Not on file    Inability: Not on file  . Transportation needs:    Medical: Not on file    Non-medical: Not on file  Tobacco Use  . Smoking status: Former Smoker    Packs/day: 1.00    Years: 20.00    Pack years: 20.00    Types: Cigarettes    Last attempt to quit: 10/22/1986    Years since quitting: 31.7  . Smokeless tobacco: Never Used  Substance and Sexual Activity  . Alcohol use: Yes    Comment: rarely  . Drug use: No  . Sexual activity: Never  Lifestyle  . Physical activity:    Days per week: Not on file    Minutes per session: Not on file  . Stress: Not on file  Relationships  . Social connections:    Talks on phone: Not on file    Gets together: Not on file    Attends religious service: Not on file    Active member of club or organization: Not on file    Attends meetings of clubs or organizations: Not on file    Relationship status: Not on file  . Intimate partner violence:    Fear of current or ex  partner: Not on file    Emotionally abused: Not on file    Physically abused: Not on file    Forced sexual activity: Not on file  Other Topics Concern  . Not on file  Social History Narrative  . Not on file  ,  reports that she quit smoking about 31 years ago. Her smoking use included cigarettes. She has a 20.00 pack-year smoking history. She has never used smokeless tobacco. She reports that she drinks alcohol. She reports that she does not use drugs.   Family history  Her family history includes Cancer in her mother and sister; Coronary artery disease in her sister; Coronary artery disease (age of onset: 64) in her brother; Diabetes in her mother and sister; Hypertension in her mother.   Allergies Allergies  Allergen Reactions  . Macrobid [Nitrofurantoin] Itching and Rash  . Sulfa Antibiotics Itching and Rash    Home meds  Prior to Admission medications   Medication Sig Start Date End Date Taking? Authorizing Provider  aspirin 81 MG chewable tablet Chew by mouth daily.    [provider]  buPROPion (WELLBUTRIN SR) 100 MG 12 hr tablet Take 100 mg by mouth 2 (two) times daily.    [provider]  cholecalciferol (VITAMIN D) 1000 units tablet Take 1,000 Units by mouth daily.    [provider]  DULoxetine (CYMBALTA) 60 MG capsule Take 60 mg by mouth daily.    [provider]  methocarbamol (ROBAXIN) 500 MG tablet Take 1 tablet (500 mg total) by mouth 3 (three) times daily as needed. Patient not taking: Reported on 03/30/2018 02/24/17   Netta Cedars, MD  Multiple Vitamin (MULTIVITAMIN WITH MINERALS) TABS tablet Take 1 tablet by mouth daily.    [provider]  omeprazole (PRILOSEC) 20 MG capsule Take 20 mg by mouth daily.    [provider]  oxyCODONE (ROXICODONE) 5 MG immediate release tablet Take 2 tablets (10 mg total) by mouth every 4 (four) hours as needed for severe pain. Patient not taking: Reported on 03/30/2018 02/24/17   Netta Cedars, MD  Oxycodone HCl 10 MG TABS Take 10 mg by mouth every 6 (six) hours.     [provider]  solifenacin (VESICARE) 10 MG tablet Take 10 mg by mouth at bedtime.     [provider]     .I have discussed the plan of care with the medical team (including consultants, NP, RN and Elink and patient's family members.  Signed Dr Seward [facility]

## 2018-07-08 NOTE — Consult Note (Addendum)
NEURO HOSPITALIST CONSULT NOTE   Requestig physician: Dr. Gilford Raid  Reason for Consult: Mass seen on CT head  History obtained from:   Chart and Husband  HPI:                                                                                                                                          Alexis Edwards is an 72 y.o. female who presented to OSH on Saturday after having new onset seizures at home. LKN was at noon, followed by confusion and new onset seizure at 4 PM. The first seizure lasted for about 1 minute. Husband describes several spells, each lasting about one minute, the worst of which consisted of violent jerking of her upper extremities with the UEs being held close to her body in flexion. This was followed by somnolence. The other spells occurred when aroused at which point she would exhibit coarse tremors of her upper extremities. The husband is a poor historian and it is unclear if the tremulousness occurred before or after the GTC seizure described above.   On arrival of EMS to the home the patient was postictal and had waxing/waning level of consciousness. She was taken to the [facility] ED where she had a second seizure at 1752, consisting of staring to the right with jerking of her face and all extremities. About one hour later, she became more somnolent with sonorous respirations and with difficulty to arouse; she was then intubated.   CT head at OSH revealed a small left anterior frontal mass lesion, with extensive edema throughout the adjacent brain parenchyma of the left frontal lobe; there was associated mass effect noted, with 11 mm of left to right midline shift. Per the radiology report, it was felt most likely to be a meningioma, but differential also included a cortically based lesion or a dural metastasis.    On arrival to [facility], she could follow commands and respond to her name.   CT head: Left anterior frontal mass lesion measuring 3.3 cm,  findings favor a dural base meningioma, but dural metastasis or cortical brain lesion is possible. MRI of the brain with and without contrast is recommended to further characterize. Extensive edema throughout the adjacent left frontal lobe. Associated mass effect with 11 mm of left-to-right midline shift.  Past Medical History:  Diagnosis Date  . Arthritis   . CHF (congestive heart failure) ([place])   . COPD (chronic obstructive pulmonary disease) ([place])   . Depression   . Dyslipidemia   . GERD (gastroesophageal reflux disease)   . Heart murmur   . History of kidney stones   . HTN (hypertension)   . Hyperlipidemia   . Obesity    BMI 55  . Pneumonia    hx  . Sleep apnea  does not use cpap  . Sleep apnea   . Takotsubo syndrome April 2013   apical ballooning, [facility]- [place]    Past Surgical History:  Procedure Laterality Date  . ABDOMINAL HYSTERECTOMY    . CARDIAC CATHETERIZATION    . CHOLECYSTECTOMY  2/10  . REVERSE SHOULDER ARTHROPLASTY Left 02/24/2017   Procedure: REVERSE LEFT SHOULDER ARTHROPLASTY;  Surgeon: Netta Cedars, MD;  Location: [facility];  Service: Orthopedics;  Laterality: Left;  . TOTAL KNEE ARTHROPLASTY  5/12   Lt    Family History  Problem Relation Age of Onset  . Coronary artery disease Brother 50       stent  . Diabetes Mother   . Hypertension Mother   . Cancer Mother   . Coronary artery disease Sister   . Diabetes Sister   . Cancer Sister               Social History:  reports that she quit smoking about 31 years ago. Her smoking use included cigarettes. She has a 20.00 pack-year smoking history. She has never used smokeless tobacco. She reports that she drinks alcohol. She reports that she does not use drugs.  Allergies  Allergen Reactions  . Macrobid [Nitrofurantoin] Itching and Rash  . Sulfa Antibiotics Itching and Rash    MEDICATIONS:                                                                                                                      Prior to Admission:  Medications Prior to Admission  Medication Sig Dispense Refill Last Dose  . aspirin 81 MG chewable tablet Chew by mouth daily.   03/29/2018 at Unknown time  . buPROPion (WELLBUTRIN SR) 100 MG 12 hr tablet Take 100 mg by mouth 2 (two) times daily.   03/29/2018 at Unknown time  . cholecalciferol (VITAMIN D) 1000 units tablet Take 1,000 Units by mouth daily.   03/29/2018 at Unknown time  . DULoxetine (CYMBALTA) 60 MG capsule Take 60 mg by mouth daily.   03/29/2018 at Unknown time  . methocarbamol (ROBAXIN) 500 MG tablet Take 1 tablet (500 mg total) by mouth 3 (three) times daily as needed. (Patient not taking: Reported on 03/30/2018) 60 tablet 1 Not Taking at Unknown time  . Multiple Vitamin (MULTIVITAMIN WITH MINERALS) TABS tablet Take 1 tablet by mouth daily.   03/29/2018 at Unknown time  . omeprazole (PRILOSEC) 20 MG capsule Take 20 mg by mouth daily.   03/29/2018 at Unknown time  . oxyCODONE (ROXICODONE) 5 MG immediate release tablet Take 2 tablets (10 mg total) by mouth every 4 (four) hours as needed for severe pain. (Patient not taking: Reported on 03/30/2018) 40 tablet 0 Not Taking at Unknown time  . Oxycodone HCl 10 MG TABS Take 10 mg by mouth every 6 (six) hours.    03/29/2018 at Unknown time  . solifenacin (VESICARE) 10 MG tablet Take 10 mg by mouth at bedtime.    Past Week at Unknown time  Scheduled: . chlorhexidine gluconate (MEDLINE KIT)  15 mL Mouth Rinse BID  . heparin  5,000 Units Subcutaneous Q8H  . insulin aspart  0-15 Units Subcutaneous Q4H  . mouth rinse  15 mL Mouth Rinse 10 times per day  . pantoprazole sodium  40 mg Per Tube Daily   Continuous: . sodium chloride 250 mL (07/08/18 0334)  . levETIRAcetam       ROS:                                                                                                                                       Unable to obtain due to intubation and drowsy to somnolent state.   Pulse 63, temperature 98.6 F (37 C),  temperature source Core (Comment), resp. rate 20, height 5' 2"  (1.575 m), weight 104.6 kg, SpO2 100 %.   General Examination:                                                                                                       Physical Exam  HEENT-  [place]/AT   Lungs- Intubated Extremities- Warm and well perfused   Neurological Examination Mental Status: Drowsy to somnolent in the context of recent Versed dose of 1 mg. She requires extended stimulation to awaken. After awakening, she will follow basic motor commands. She also will make eye contact. She is somewhat agitated when awakened. She rapidly falls asleep.  Cranial Nerves: II: Pupils equal. Will gaze at examiner's face and make eye contact briefly. Not cooperative to visual field testing.   III,IV, VI: Will track to left and right conjugately.  VII: Unable to assess facial droop in the context of intubation.  VIII: hearing intact to voice IX,X: Intubated XI: Head at midline XII: Intubated Motor: Will briskly elevate both arms and each leg antigravity to command without asymmetry.  Sensory: Reacts to FT bilateral lower extremities.  Deep Tendon Reflexes: Hypoactive throughout Plantars: Unable to assess due to brisk withdrawal of entire leg to plantar stimulation.  Cerebellar/Gait: Unable to assess   Lab Results: Basic Metabolic Panel: No results for input(s): NA, K, CL, CO2, GLUCOSE, BUN, CREATININE, CALCIUM, MG, PHOS in the last 168 hours.  CBC: No results for input(s): WBC, NEUTROABS, HGB, HCT, MCV, PLT in the last 168 hours.  Cardiac Enzymes: No results for input(s): CKTOTAL, CKMB, CKMBINDEX, TROPONINI in the last 168 hours.  Lipid Panel: No results for input(s): CHOL, TRIG, HDL, CHOLHDL, VLDL, LDLCALC in the last 168 hours.  Imaging:  No results found.  Assessment: 72 year old female presenting to OSH on Saturday with new onset seizures. Imaging revealed an intracranial mass with associated edema in the left frontal  lobe.  1. CT head at OSH revealed a small left anterior frontal mass lesion, with extensive edema throughout the adjacent brain parenchyma of the left frontal lobe; there was associated mass effect noted, with 11 mm of left to right midline shift. Per the radiology report, it was felt most likely to be a meningioma, but differential also included a cortically based lesion or a dural metastasis.   2. Neurological exam is nonlateralizing. A drowsy to somnolent state is noted. The patient is able to follow simple commands. No jerking, twitching or other adventitious movements suggestive of seizure are noted.   Recommendations: 1. Loaded with 1000 mg Keppra at OSH. Continue Keppra at 1000 mg IV BID 2. EEG (ordered) 3. MRI brain with and without contrast. If the lesion is revealed to most likely be a metastasis, then CT of chest/abdomen/pelvis should be ordered. 4. Wellbutrin has been discontinued. This medication may lower the seizure threshold.   40 minutes spent in the evaluation and management of this critically ill patient.   Electronically signed: Dr. Kerney Elbe 07/08/2018, 12:31 AM

## 2018-07-08 NOTE — Progress Notes (Signed)
Same-day progress note Patient seen and examined. Patient is completely awake and following all commands although she remains intubated. On examination, she has no cranial nerve deficits, follows all commands, antigravity in both upper extremities, 3/5 in left lower extremity 2/5 in right lower extremity.  No sensory loss.  No coordination deficits.  MRI brain completed and shows a 3.2 cm solitary left frontal mass that is favored to be extra-axial and a meningioma with severe vasogenic edema and subfalcine herniation.  An EEG is pending at this time  Recommendations: -Continue with Keppra and dexamethasone - EEG has been ordered and is pending but based on patient's exam it does not appear that she is in status at this time. - Further recommendations per neurosurgery. -Maintain seizure precautions -We will need neuro-oncology follow-up as an outpatient.  Neurology services will be available as needed.  Please call with questions.

## 2018-07-09 ENCOUNTER — Inpatient Hospital Stay (HOSPITAL_COMMUNITY): Payer: Medicare Other

## 2018-07-09 DIAGNOSIS — G9340 Encephalopathy, unspecified: Secondary | ICD-10-CM

## 2018-07-09 DIAGNOSIS — J9601 Acute respiratory failure with hypoxia: Secondary | ICD-10-CM

## 2018-07-09 LAB — CBC WITH DIFFERENTIAL/PLATELET
Abs Immature Granulocytes: 0.1 10*3/uL (ref 0.0–0.1)
Basophils Absolute: 0 10*3/uL (ref 0.0–0.1)
Basophils Relative: 0 %
Eosinophils Absolute: 0 10*3/uL (ref 0.0–0.7)
Eosinophils Relative: 0 %
HCT: 39.9 % (ref 36.0–46.0)
Hemoglobin: 13.5 g/dL (ref 12.0–15.0)
Immature Granulocytes: 1 %
Lymphocytes Relative: 6 %
Lymphs Abs: 0.9 10*3/uL (ref 0.7–4.0)
MCH: 33.3 pg (ref 26.0–34.0)
MCHC: 33.8 g/dL (ref 30.0–36.0)
MCV: 98.3 fL (ref 78.0–100.0)
Monocytes Absolute: 0.7 10*3/uL (ref 0.1–1.0)
Monocytes Relative: 5 %
Neutro Abs: 12.5 10*3/uL — ABNORMAL HIGH (ref 1.7–7.7)
Neutrophils Relative %: 88 %
Platelets: 270 10*3/uL (ref 150–400)
RBC: 4.06 MIL/uL (ref 3.87–5.11)
RDW: 11.5 % (ref 11.5–15.5)
WBC: 14.3 10*3/uL — ABNORMAL HIGH (ref 4.0–10.5)

## 2018-07-09 LAB — GLUCOSE, CAPILLARY
Glucose-Capillary: 155 mg/dL — ABNORMAL HIGH (ref 70–99)
Glucose-Capillary: 163 mg/dL — ABNORMAL HIGH (ref 70–99)
Glucose-Capillary: 129 mg/dL — ABNORMAL HIGH (ref 70–99)
Glucose-Capillary: 131 mg/dL — ABNORMAL HIGH (ref 70–99)
Glucose-Capillary: 117 mg/dL — ABNORMAL HIGH (ref 70–99)

## 2018-07-09 LAB — HEPATIC FUNCTION PANEL
ALT: 17 U/L (ref 0–44)
AST: 15 U/L (ref 15–41)
Albumin: 3.1 g/dL — ABNORMAL LOW (ref 3.5–5.0)
Alkaline Phosphatase: 48 U/L (ref 38–126)
Bilirubin, Direct: 0.2 mg/dL (ref 0.0–0.2)
Indirect Bilirubin: 0.8 mg/dL (ref 0.3–0.9)
Total Bilirubin: 1 mg/dL (ref 0.3–1.2)
Total Protein: 6.3 g/dL — ABNORMAL LOW (ref 6.5–8.1)

## 2018-07-09 LAB — HEMOGLOBIN A1C
Hgb A1c MFr Bld: 5.1 % (ref 4.8–5.6)
Mean Plasma Glucose: 100 mg/dL

## 2018-07-09 LAB — PHOSPHORUS: Phosphorus: 3.2 mg/dL (ref 2.5–4.6)

## 2018-07-09 LAB — URINE CULTURE

## 2018-07-09 LAB — LACTIC ACID, PLASMA: Lactic Acid, Venous: 1 mmol/L (ref 0.5–1.9)

## 2018-07-09 LAB — MAGNESIUM: Magnesium: 2.1 mg/dL (ref 1.7–2.4)

## 2018-07-09 LAB — TROPONIN I: Troponin I: <0.03 ng/mL (ref <0.03)

## 2018-07-09 MED ORDER — MELATONIN 3 MG PO TABS
3.0000 mg | ORAL_TABLET | Freq: Once | ORAL | Status: AC
  Administered 2018-07-09: 3 mg via ORAL
  Filled 2018-07-09: qty 1

## 2018-07-09 MED ORDER — NON FORMULARY: 3.0000 mg | Freq: Once | Status: DC

## 2018-07-09 MED ORDER — ORAL CARE MOUTH RINSE
15.0000 mL | Freq: Two times a day (BID) | OROMUCOSAL | Status: DC
  Administered 2018-07-09 – 2018-07-11 (×4): 15 mL via OROMUCOSAL

## 2018-07-09 NOTE — Progress Notes (Signed)
Neurosurgery Service Progress Note  Subjective: No acute events overnight.   Objective: Vitals:   07/09/18 0345 07/09/18 0400 07/09/18 0500 07/09/18 0600  BP: 119/85 (!) 112/52 (!) 111/55 (!) 122/59  Pulse: 63 61 (!) 56 (!) 56  Resp: 18 18 18 18   Temp:  98.1 F (36.7 C) 97.7 F (36.5 C) (!) 97.5 F (36.4 C)  TempSrc:      SpO2: 98% 95% 95% 95%  Weight:    104.2 kg  Height:    5' 2"  (1.575 m)   Temp (24hrs), Avg:97.8 F (36.6 C), Min:97 F (36.1 C), Max:98.4 F (36.9 C)  CBC Latest Ref Rng & Units 07/09/2018 07/08/2018 02/25/2017  WBC 4.0 - 10.5 K/uL 14.3(H) 10.3 -  Hemoglobin 12.0 - 15.0 g/dL 13.5 13.6 14.8  Hematocrit 36.0 - 46.0 % 39.9 40.0 44.9  Platelets 150 - 400 K/uL 270 241 -   BMP Latest Ref Rng & Units 07/08/2018 07/08/2018 02/25/2017  Glucose 70 - 99 mg/dL 167(H) 169(H) 142(H)  BUN 8 - 23 mg/dL 21 12 8   Creatinine 0.44 - 1.00 mg/dL 0.86 1.05(H) 1.01(H)  Sodium 135 - 145 mmol/L 140 139 137  Potassium 3.5 - 5.1 mmol/L 3.9 4.2 4.4  Chloride 98 - 111 mmol/L 107 103 98(L)  CO2 22 - 32 mmol/L 24 24 30   Calcium 8.9 - 10.3 mg/dL 8.6(L) 8.9 8.9    Intake/Output Summary (Last 24 hours) at 07/09/2018 0703 Last data filed at 07/09/2018 0616 Gross per 24 hour  Intake 818.05 ml  Output 1090 ml  Net -271.95 ml    Current Facility-Administered Medications:  .  0.9 %  sodium chloride infusion, 250 mL, Intravenous, PRN, Omar Person, NP, Last Rate: 10 mL/hr at 07/09/18 0600 .  albuterol (PROVENTIL) (2.5 MG/3ML) 0.083% nebulizer solution 2.5 mg, 2.5 mg, Nebulization, Q4H PRN, Omar Person, NP .  chlorhexidine gluconate (MEDLINE KIT) (PERIDEX) 0.12 % solution 15 mL, 15 mL, Mouth Rinse, BID, Scatliffe, Kristen D, MD, 15 mL at 07/08/18 1934 .  dexamethasone (DECADRON) injection 4 mg, 4 mg, Intravenous, Q6H, Amie Portland, MD, 4 mg at 07/09/18 0604 .  feeding supplement (PRO-STAT SUGAR FREE 64) liquid 30 mL, 30 mL, Per Tube, BID, Brand Males, MD, 30 mL at 07/08/18  2130 .  feeding supplement (VITAL HIGH PROTEIN) liquid 1,000 mL, 1,000 mL, Per Tube, Q24H, Brand Males, MD, 1,000 mL at 07/08/18 1928 .  fentaNYL (SUBLIMAZE) injection 50 mcg, 50 mcg, Intravenous, Q15 min PRN, Omar Person, NP, 50 mcg at 07/08/18 1000 .  fentaNYL (SUBLIMAZE) injection 50 mcg, 50 mcg, Intravenous, Q2H PRN, Omar Person, NP, 50 mcg at 07/09/18 0340 .  heparin injection 5,000 Units, 5,000 Units, Subcutaneous, Q8H, Omar Person, NP, 5,000 Units at 07/09/18 0604 .  insulin aspart (novoLOG) injection 0-15 Units, 0-15 Units, Subcutaneous, Q4H, Omar Person, NP, 3 Units at 07/09/18 0414 .  levETIRAcetam (KEPPRA) IVPB 1000 mg/100 mL premix, 1,000 mg, Intravenous, Q12H, Omar Person, NP, Stopped at 07/08/18 1951 .  MEDLINE mouth rinse, 15 mL, Mouth Rinse, 10 times per day, Scatliffe, Kristen D, MD, 15 mL at 07/09/18 0604 .  midazolam (VERSED) injection 1 mg, 1 mg, Intravenous, Q15 min PRN, Omar Person, NP, 1 mg at 07/08/18 0958 .  midazolam (VERSED) injection 1 mg, 1 mg, Intravenous, Q2H PRN, Omar Person, NP, 1 mg at 07/09/18 0340 .  pantoprazole sodium (PROTONIX) 40 mg/20 mL oral suspension 40 mg, 40 mg, Per Tube, Daily, Hayden Pedro  M, NP, Stopped at 07/08/18 1135   Physical Exam: Intubated, eyes open to voice, PERRL, EOMI, FS, TM, FCx4, Strength 5/5 x4, SILTx4  Assessment & Plan: 72 y.o. woman p/w sz, CT/MRI showed left frontal dural-based mass c/w likely meningioma. -pt conversant and nodded that she understood that she has a brain tumor that is likely benign -seizures appear well controlled, will keep plan of care - allow her to recover from her recent abdominal surgery and discuss surgical timing after she is extubated -please let me know when the patient is extubated and I will come give her further details  Judith Part  07/09/18 7:03 AM

## 2018-07-09 NOTE — Progress Notes (Signed)
EEG Completed; Results Pending  

## 2018-07-09 NOTE — Progress Notes (Signed)
Alexis Edwards  DTO:[id number] DOB: September 21, 1946 DOA: 07/08/2018 PCP: Leonides Sake, MD    LOS: 1 day   Reason for Consult / Chief Complaint:  Mass of brain with surrounding edema Intubated  Consulting MD and date:  Ulla Gallo- EDP at [facility]   HPI/Summary of hospital stay:  72 yr old female with PMHx significant for CHF, previous Takotsubo episode in 2010,  Depression, HLD, HTN, Obesity, Obstructive Sleep Apnea presented to [facility] ED on 07/07/18 with chief complaint of altered mental status. Per ED documentation pt's family reports that around noon she was confused and by 1600 she had her first episode of generalized tonic clonic seizure for 1 min duration. EMS was called and on arrival pt was post ictal followed by moments of confusion mixed with episodes of clarity. The second  witnessed episode of seizure occurred at 17:52 in [place] ED and was described as staring to the right with jerking of all extremities and face. At 19:07 pt was noted to be more somnolent with sonorous respirations and difficult to arouse and a decision was made to intubate.    Vitals in [place] ED:  BP ranged from 137/74mmHg to 166/65mmHg  RR12-18 O2 Sat 93-100% and Temp 98.6 to 99.2 deg F   Subjective:  07/09/2018  - Weaning very well this AM, no events overnight, no new complaints  Objective   Blood pressure (!) 120/51, pulse 63, temperature (!) 97.5 F (36.4 C), resp. rate 18, height 5\' 2"  (1.575 m), weight 104.2 kg, SpO2 97 %.    Vent Mode: PSV;CPAP FiO2 (%):  [40 %-50 %] 40 % Set Rate:  [18 bmp] 18 bmp Vt Set:  [420 mL] 420 mL PEEP:  [5 cmH20] 5 cmH20 Pressure Support:  [5 cmH20-8 cmH20] 5 cmH20 Plateau Pressure:  [11 cmH20-13 cmH20] 11 cmH20   Intake/Output Summary (Last 24 hours) at 07/09/2018 1017 Last data filed at 07/09/2018 0900 Gross per 24 hour  Intake 836.28 ml  Output 890 ml  Net -53.72 ml   Filed Weights   07/08/18 0000 07/09/18 0600  Weight: 104.6 kg 104.2 kg     General Appearance:  Well appearing this AM, NAD Head:  [place]/AT Eyes:  PERRL Ears:  WNL Nose:  No D/C Throat:  ETT in place Neck:  Supple, -LAN and -thyromegally Lungs: CTA bilaterally Heart:  RRR, Nl S1/S2 and -M/R/G Abdomen:  Soft, NT, ND and +BS Genitalia / Rectal:  Not done Extremities:  -edema and -tenderness Skin:  Intact Neurologic:  Off sedation, awake and following all commands  LABS   PULMONARY Recent Labs  Lab 07/08/18 0107  PHART 7.426  PCO2ART 37.8  PO2ART 165.0*  HCO3 25.0  TCO2 26  O2SAT 100.0   CBC Recent Labs  Lab 07/08/18 0138 07/09/18 0209  HGB 13.6 13.5  HCT 40.0 39.9  WBC 10.3 14.3*  PLT 241 270   COAGULATION No results for input(s): INR in the last 168 hours.  CARDIAC   Recent Labs  Lab 07/09/18 0209  TROPONINI <0.03   No results for input(s): PROBNP in the last 168 hours.  CHEMISTRY Recent Labs  Lab 07/08/18 0138 07/08/18 2318 07/09/18 0209  NA 139 140  --   K 4.2 3.9  --   CL 103 107  --   CO2 24 24  --   GLUCOSE 169* 167*  --   BUN 12 21  --   CREATININE 1.05* 0.86  --   CALCIUM 8.9  8.6*  --   MG 1.8  --  2.1  PHOS  --   --  3.2   Estimated Creatinine Clearance: 66.9 mL/min (by C-G formula based on SCr of 0.86 mg/dL).  LIVER Recent Labs  Lab 07/08/18 0138 07/09/18 0209  AST 21 15  ALT 18 17  ALKPHOS 51 48  BILITOT 1.3* 1.0  PROT 6.3* 6.3*  ALBUMIN 3.4* 3.1*   INFECTIOUS Recent Labs  Lab 07/08/18 0138 07/08/18 2318  LATICACIDVEN  --  1.0  PROCALCITON <0.10  --    ENDOCRINE CBG (last 3)  Recent Labs    07/08/18 2333 07/09/18 0406 07/09/18 0818  GLUCAP 174* 155* 163*   IMAGING x48h  - image(s) personally visualized  -   highlighted in bold Mr Brain W Wo Contrast  Result Date: 07/08/2018 CLINICAL DATA:  Brain mass. EXAM: MRI HEAD WITHOUT AND WITH CONTRAST TECHNIQUE: Multiplanar, multiecho pulse sequences of the brain and surrounding structures were obtained without and with intravenous  contrast. CONTRAST:  10 cc Gadavist intravenous COMPARISON:  Head CT from yesterday FINDINGS: Brain: 28 x 32 x 24 mm avidly enhancing mass along the high and anterior left frontal convexity. This is favored to be extra-axial and this is primarily based on apparent claw sign with cortex buckled anterior and posterior to the mass on sagittal T1 weighted imaging. Deep to the mass a cortical rim or CSF cleft is not visualized but this may be related to compression. There is suggestion of a dural tail on postcontrast imaging. The mass has central vessels and is primarily isointense to cortex on T2 weighted imaging. No second mass is seen. Vasogenic edema is with midline shift and subfalcine herniation. No entrapment. No ACA infarct. Vascular: Major flow voids and vascular enhancements are preserved. Skull and upper cervical spine: No evidence of marrow lesion Sinuses/Orbits: Negative IMPRESSION: 1. 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 2. Severe vasogenic edema with subfalcine herniation. Electronically Signed   By: Monte Fantasia M.D.   On: 07/08/2018 10:47   Dg Chest Port 1 View  Result Date: 07/09/2018 CLINICAL DATA:  72 year old female on ventilator. Seizure. Subsequent encounter. EXAM: PORTABLE CHEST 1 VIEW COMPARISON:  07/08/2018. FINDINGS: Endotracheal tube tip 3.8 cm above the carina. Nasogastric tube courses below the diaphragm. Tip is not included on the present exam. Cardiomegaly. Bilateral pleural effusions. Pulmonary vascular congestion. Bibasilar atelectasis. No pneumothorax detected. Post left shoulder replacement. Calcified tortuous aorta. IMPRESSION: 1. Endotracheal tube tip 3.8 cm above the carina. 2. Cardiomegaly, pulmonary vascular congestion and small bilateral pleural effusions without significant change. 3. Basilar subsegmental atelectasis. 4.  Aortic Atherosclerosis (ICD10-I70.0).  Calcified tortuous aorta. Electronically Signed   By: Genia Del M.D.    On: 07/09/2018 07:22   Dg Chest Port 1 View  Result Date: 07/08/2018 CLINICAL DATA:  Life support line placement. EXAM: PORTABLE CHEST 1 VIEW COMPARISON:  Chest radiograph July 07, 2018 at 1946 hours. FINDINGS: Endotracheal tube tip projects 18 mm above the carina. Nasogastric tube past GE junction, distal tip out of field of view. Mild cardiomegaly. Pulmonary vascular congestion and interstitial prominence. Low inspiratory examination with bibasilar strandy densities. Small suspected LEFT pleural effusion. No pneumothorax. LEFT shoulder arthroplasty. IMPRESSION: Endotracheal tube tip projects 1.8 cm above the carina. Mild cardiomegaly with mild interstitial prominence, potentially related to low inspiratory examination. Bibasilar atelectasis and small suspected LEFT pleural effusion. Electronically Signed   By: Elon Alas M.D.   On: 07/08/2018 00:53   Dg  Abd Portable 1v  Result Date: 07/08/2018 CLINICAL DATA:  Orogastric tube placement. EXAM: PORTABLE ABDOMEN - 1 VIEW COMPARISON:  Abdominal radiograph June 29, 2018 FINDINGS: Nasogastric tube tip projects in mid stomach. Partially imaged LEFT nephroureteral stent. Surgical clips in the included right abdomen compatible with cholecystectomy. Included bowel gas pattern is nondilated and nonobstructive. Linear lucency LEFT mid abdomen. IMPRESSION: 1. Nasogastric tube tip projecting in mid abdomen. LEFT nephroureteral stent. 2. Linear lucency LEFT abdomen, potentially external to the patient though, subcutaneous gas not excluded. 3. Nonspecific bowel gas pattern. Electronically Signed   By: Elon Alas M.D.   On: 07/08/2018 00:54     Recent Results (from the past 240 hour(s))  MRSA PCR Screening     Status: None   Collection Time: 07/08/18 12:33 AM  Result Value Ref Range Status   MRSA by PCR NEGATIVE NEGATIVE Final    Comment:        The GeneXpert MRSA Assay (FDA approved for NASAL specimens only), is one component of  a comprehensive MRSA colonization surveillance program. It is not intended to diagnose MRSA infection nor to guide or monitor treatment for MRSA infections. Performed at [facility], [place] [address] [place], [place] [address]   Culture, Urine     Status: Abnormal   Collection Time: 07/08/18 12:44 AM  Result Value Ref Range Status   Specimen Description URINE, RANDOM  Final   Special Requests   Final    NONE Performed at [facility], [place] [address] [place], [place] [address]    Culture MULTIPLE SPECIES PRESENT, SUGGEST RECOLLECTION (A)  Final   Report Status 07/09/2018 FINAL  Final  Culture, respiratory (non-expectorated)     Status: None (Preliminary result)   Collection Time: 07/08/18  1:08 AM  Result Value Ref Range Status   Specimen Description TRACHEAL ASPIRATE  Final   Special Requests NONE  Final   Gram Stain NO WBC SEEN RARE GRAM POSITIVE COCCI   Final   Culture   Final    CULTURE REINCUBATED FOR BETTER GROWTH Performed at [facility], [place] [address], [place], [place] [address]    Report Status PENDING  Incomplete  Culture, blood (routine x 2)     Status: None (Preliminary result)   Collection Time: 07/08/18  1:35 AM  Result Value Ref Range Status   Specimen Description BLOOD LEFT ARM  Final   Special Requests   Final    BOTTLES DRAWN AEROBIC AND ANAEROBIC Blood Culture results may not be optimal due to an excessive volume of blood received in culture bottles   Culture   Final    NO GROWTH 1 DAY Performed at [facility], [place] [address], [place], [place] [address]    Report Status PENDING  Incomplete  Culture, blood (routine x 2)     Status: None (Preliminary result)   Collection Time: 07/08/18  1:40 AM  Result Value Ref Range Status   Specimen Description BLOOD LEFT HAND  Final   Special Requests   Final    BOTTLES DRAWN AEROBIC ONLY Blood Culture adequate volume   Culture   Final    NO GROWTH 1 DAY Performed at [facility], [place] [address], [place], [place] [address]    Report Status PENDING  Incomplete     Consults: date of consult/date signed off & final recs:   Neurology: Cheral Marker MD awaiting recs  Procedures: None at this time  Significant Diagnostic  Tests: CT Head  FINDINGS: Brain: Left anterior frontal mass lesion measuring 2.1 x 3.3 x 3.3 cm (AP x ML x CC series 2, image 13 and series 401, image 43). The mass has a large dural base and associated hypertrophy of the inner table of calvarium. There is mass effect on the adjacent brain and extensive edema throughout the left frontal lobe extending into the anterior basal ganglia and crossing genu of corpus callosum to the contralateral right frontal lobe. Mass effect from the lesion and edema results in 11 mm of left-to-right midline shift and partial effacement of the frontal horns of lateral ventricles. No downward herniation. No acute stroke or hemorrhage identified.  Vascular: Calcific atherosclerosis of the carotid siphons. No hyperdense vessels identified. Skull: Normal. Negative for fracture or focal lesion. Sinuses/Orbits: No acute finding. Other: None. IMPRESSION: Left anterior frontal mass lesion measuring 3.3 cm, findings favor a dural base meningioma, but dural metastasis or cortical brain lesion is possible. MRI of the brain with and without contrast is recommended to further characterize. Extensive edema throughout the adjacent left frontal lobe. Associated mass effect with 11 mm of left-to-right midline shift.  Micro Data: Blood cx x 2 pending   Antimicrobials:  Not currently on antibiotics  Resolved Hospital Problem list    Assessment & Plan:   NEURO: Altered mental status with new onset seizures secondary to Left anterior frontal mass lesion 3.3cm with extensive edema Loaded with AED- Keppra post seizure activity continue with Keppra 1000mg  BID  PLAN - Keppra for seizure control - EEG per neuro whenever  needed - No surgical interventions until recovered from abdominal surgery - D/C all sedation  CARDIAC: Hemodynamically stable H/o CHF Last EF 65-70% EKG QTc 424 PR 170 low voltage QRS Cardiac Panel (last 3 results)  PLAN - Tele monitoring - Lasix to PRN at this point  PULMONARY: Acute hypoxic respiratory failure in the setting of acute encephalopathy - Intubated on Mechanical ventilation  Plan - Extubate - Ambulate - IS - Flutter valve - Titrate O2 for sat of 88-92% - SLP  Endocrine: Check TSH No H/o insulin dependence BG currently 169mg /dl  Continue to monitor if BG >180mg /dl check Hgb A1c and start on ICU glycemic protocol  GI: NPO H/o diverticulosis  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  Neurosurgery after recovery from abdominal surgery.  Neurology to control seizure.  Post extubation orders placed.    DVT prophylaxis: SCDs GI prophylaxis: Protonix Diet: NPO Mobility:Bed rest Code Status: FULL Family Communication: son and grandaughter updated.   The patient is critically ill with multiple organ systems failure and requires high complexity decision making for assessment and support, frequent evaluation and titration of therapies, application of advanced monitoring technologies and extensive interpretation of multiple databases.   Critical Care Time devoted to patient care services described in this note is  34  Minutes. This time  reflects time of care of this signee Dr Jennet Maduro. This critical care  time does not reflect procedure time, or teaching time or supervisory time of PA/NP/Med student/Med Resident etc but could involve care discussion time.  Rush Farmer, M.D. [facility] Pulmonary/Critical Care Medicine. Pager: [phone]. After hours pager: [phone].  07/09/2018 10:17 AM

## 2018-07-09 NOTE — Procedures (Signed)
ELECTROENCEPHALOGRAM REPORT   Patient: Alexis Edwards       Room #: 4N31C EEG No. ID: [id number] Age: 72 y.o.        Sex: female Referring Physician: Scatliffe Report Date:  07/09/2018        Interpreting Physician: Alexis Goodell  History: [name] is an 72 y.o. female with altered mental status and seizure  Medications:  Decadron, Insulin, Keppra, Protonix  Conditions of Recording:  This is a 21 channel routine scalp EEG performed with bipolar and monopolar montages arranged in accordance to the international 10/20 system of electrode placement. One channel was dedicated to EKG recording.  The patient is in the awake and drowsy states.  Description:  The patient is awake briefly during the recording.  During this brief period the waking background activity consists of a low voltage, symmetrical, fairly well organized, 8 Hz alpha activity, seen from the parieto-occipital and posterior temporal regions that is poorly sustained.  Low voltage fast activity, poorly organized, is seen anteriorly and is at times superimposed on more posterior regions.  A mixture of theta and alpha rhythms are seen from the central and temporal regions. The patient drowses with slowing to irregular, low voltage theta and beta activity.   Stage II sleep is not obtained. No epileptiform activity is noted.   Hyperventilation and intermittent photic stimulation were not performed.   IMPRESSION: Normal electroencephalogram, awake and drowsy. There are no focal lateralizing or epileptiform features.   Alexis Goodell, MD Neurology [phone] 07/09/2018, 1:51 PM

## 2018-07-09 NOTE — Evaluation (Signed)
Clinical/Bedside Swallow Evaluation Patient Details  Name: Alexis Edwards MRN: [id number] Date of Birth: [birth year]  Today's Date: 07/09/2018 Time: SLP Start Time (ACUTE ONLY): 9381 SLP Stop Time (ACUTE ONLY): 1300 SLP Time Calculation (min) (ACUTE ONLY): 15 min  Past Medical History:  Past Medical History:  Diagnosis Date  . Arthritis   . CHF (congestive heart failure) ([place])   . COPD (chronic obstructive pulmonary disease) ([place])   . Depression   . Dyslipidemia   . GERD (gastroesophageal reflux disease)   . Heart murmur   . History of kidney stones   . HTN (hypertension)   . Hyperlipidemia   . Obesity    BMI 55  . Pneumonia    hx  . Sleep apnea    does not use cpap  . Sleep apnea   . Takotsubo syndrome April 2013   apical ballooning, [facility]   Past Surgical History:  Past Surgical History:  Procedure Laterality Date  . ABDOMINAL HYSTERECTOMY    . CARDIAC CATHETERIZATION    . CHOLECYSTECTOMY  2/10  . REVERSE SHOULDER ARTHROPLASTY Left 02/24/2017   Procedure: REVERSE LEFT SHOULDER ARTHROPLASTY;  Surgeon: Netta Cedars, MD;  Location: [facility];  Service: Orthopedics;  Laterality: Left;  . TOTAL KNEE ARTHROPLASTY  5/12   Lt   HPI:  72 y.o. woman p/w sz, CT/MRI showed left frontal dural-based mass c/w likely meningioma.  Intubated 1-2 days.    Assessment / Plan / Recommendation Clinical Impression  Pt demonstrates no evidence of dysphagia or signs of aspiration on todays assessment, consistent with brief intubation and no prior history of dysphagia. Pt passed 3 oz water swallow. Dentures are not present to attempt solids (pt always wears dentures when eating/drinking), but no indication of possible change from baseline. Recommend a regular diet and thin liquids. WIll sign off.  SLP Visit Diagnosis: Dysphagia, oropharyngeal phase (R13.12)    Aspiration Risk  Mild aspiration risk    Diet Recommendation Regular;Thin liquid   Liquid Administration via:  Cup;Straw Medication Administration: Whole meds with liquid Supervision: Patient able to self feed Postural Changes: Seated upright at 90 degrees    Other  Recommendations Oral Care Recommendations: Oral care BID   Follow up Recommendations        Frequency and Duration            Prognosis        Swallow Study   General HPI: 72 y.o. woman p/w sz, CT/MRI showed left frontal dural-based mass c/w likely meningioma.  Intubated 1-2 days.  Type of Study: Bedside Swallow Evaluation Previous Swallow Assessment: none Diet Prior to this Study: NPO Temperature Spikes Noted: No Respiratory Status: Nasal cannula History of Recent Intubation: No Behavior/Cognition: Alert;Cooperative;Pleasant mood Oral Cavity Assessment: Within Functional Limits Oral Care Completed by SLP: No Oral Cavity - Dentition: Edentulous;Dentures, not available Vision: Functional for self-feeding Self-Feeding Abilities: Able to feed self Patient Positioning: Upright in bed Baseline Vocal Quality: Normal Volitional Cough: Strong Volitional Swallow: Able to elicit    Oral/Motor/Sensory Function Overall Oral Motor/Sensory Function: Within functional limits   Ice Chips     Thin Liquid Thin Liquid: Within functional limits Presentation: Cup;Straw    Nectar Thick Nectar Thick Liquid: Not tested   Honey Thick Honey Thick Liquid: Not tested   Puree Puree: Within functional limits   Solid     Solid: Not tested     Herbie Baltimore, MA CCC-SLP [phone]  Lynann Beaver 07/09/2018,1:01 PM

## 2018-07-09 NOTE — Progress Notes (Signed)
Patients foley catheter removed at 1615 per MD order. Patient tolerated removal well. Patient is due to void by 2115 (six hours post removal). Purwick was placed. Will continue to monitor.

## 2018-07-09 NOTE — Procedures (Signed)
Extubation Procedure Note  Patient Details:   Name: TYANNE DEROCHER DOB: [birth year] MRN: [id number]   Airway Documentation:    Vent end date: 07/09/18 Vent end time: 1035   Evaluation  O2 sats: stable throughout Complications: No apparent complications Patient did tolerate procedure well. Bilateral Breath Sounds: Clear   Yes   Patient extubated to 2L nasal cannula per MD order.  Positive cuff leak noted.  No evidence of stridor.  Patient able to speak post extubation.  Sats currently 100%.  Vitals are stable.  Incentive spirometry performed x5 with achieved goal of 1250.  No complications noted.   Philomena Doheny 07/09/2018, 10:44 AM

## 2018-07-09 NOTE — Progress Notes (Signed)
RT note: patient placed on CPAP/PSV of 5/5 at 0835 and is currently tolerating well.  Will continue to monitor.

## 2018-07-09 NOTE — Progress Notes (Addendum)
Reason for consult:  Seizure  Subjective: Patient is intubated, but alert and following commands. No further seizures.    ROS: negative except above  Examination  Vital signs in last 24 hours: Temp:  [97 F (36.1 C)-98.4 F (36.9 C)] 97.5 F (36.4 C) (09/16 0900) Pulse Rate:  [49-79] 63 (09/16 0900) Resp:  [17-24] 18 (09/16 0900) BP: (100-136)/(44-85) 120/51 (09/16 0900) SpO2:  [94 %-100 %] 97 % (09/16 0900) FiO2 (%):  [40 %-50 %] 40 % (09/16 0839) Weight:  [104.2 kg] 104.2 kg (09/16 0600)  General: Not in distress, cooperative CVS: pulse-normal rate and rhythm RS: breathing comfortably Extremities: normal   Neuro: MS: Alert, oriented, follows commands CN: pupils equal and reactive,  EOMI, face symmetric, tongue midline, normal sensation over face, Motor: 5/5 strength in all 4 extremities Coordination: normal Gait: not tested  Basic Metabolic Panel: Recent Labs  Lab 07/08/18 0138 07/08/18 2318 07/09/18 0209  NA 139 140  --   K 4.2 3.9  --   CL 103 107  --   CO2 24 24  --   GLUCOSE 169* 167*  --   BUN 12 21  --   CREATININE 1.05* 0.86  --   CALCIUM 8.9 8.6*  --   MG 1.8  --  2.1  PHOS  --   --  3.2    CBC: Recent Labs  Lab 07/08/18 0138 07/09/18 0209  WBC 10.3 14.3*  NEUTROABS  --  12.5*  HGB 13.6 13.5  HCT 40.0 39.9  MCV 98.3 98.3  PLT 241 270     Coagulation Studies: No results for input(s): LABPROT, INR in the last 72 hours.  Imaging Reviewed: MRI Alexis Edwards  EEG: Normal electroencephalogram, awake and drowsy. There are no focal lateralizing or epileptiform features.    ASSESSMENT AND PLAN  72 year old female with new onset seizures. Imaging revealed an intracranial mass with associated edema in the left frontal lobe likely to be meningioma.     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 Use caution when using heavy equipment or power tools. Avoid working on ladders or at heights. Take showers instead of baths. Ensure the water temperature is not too high on the home water heater. Do not go swimming alone. Do not lock yourself in a room alone (i.e. bathroom). When caring for infants or small children, sit down when holding, feeding, or changing them to minimize risk of injury to the child in the event you have a seizure. Maintain good sleep hygiene. Avoid alcohol.    If Alexis Edwards has another seizure, call 911 and bring them back to the ED if:       A.  The seizure lasts longer than 5 minutes.            B.  The patient doesn't wake shortly after the seizure or has new problems such as difficulty seeing, speaking or moving following the seizure       C.  The patient was injured during the seizure       D.  The patient has a temperature over 102 F (39C)  E.  The patient vomited during the seizure and now is having trouble breathing   We appreciate the consult. Please call if any questions.   Karena Addison Aroor [facility] Pager Number [phone] For questions after 7pm please refer to AMION to reach the Neurologist on call

## 2018-07-10 ENCOUNTER — Inpatient Hospital Stay (HOSPITAL_COMMUNITY): Payer: Medicare Other

## 2018-07-10 DIAGNOSIS — R0902 Hypoxemia: Secondary | ICD-10-CM

## 2018-07-10 DIAGNOSIS — G8929 Other chronic pain: Secondary | ICD-10-CM

## 2018-07-10 LAB — GLUCOSE, CAPILLARY
Glucose-Capillary: 135 mg/dL — ABNORMAL HIGH (ref 70–99)
Glucose-Capillary: 145 mg/dL — ABNORMAL HIGH (ref 70–99)
Glucose-Capillary: 146 mg/dL — ABNORMAL HIGH (ref 70–99)
Glucose-Capillary: 168 mg/dL — ABNORMAL HIGH (ref 70–99)
Glucose-Capillary: 181 mg/dL — ABNORMAL HIGH (ref 70–99)
Glucose-Capillary: 186 mg/dL — ABNORMAL HIGH (ref 70–99)
Glucose-Capillary: 98 mg/dL (ref 70–99)

## 2018-07-10 LAB — CULTURE, RESPIRATORY W GRAM STAIN
Culture: NORMAL
Gram Stain: NONE SEEN

## 2018-07-10 LAB — CBC WITH DIFFERENTIAL/PLATELET
Abs Immature Granulocytes: 0.1 10*3/uL (ref 0.0–0.1)
Basophils Absolute: 0 10*3/uL (ref 0.0–0.1)
Basophils Relative: 0 %
Eosinophils Absolute: 0 10*3/uL (ref 0.0–0.7)
Eosinophils Relative: 0 %
HCT: 39.8 % (ref 36.0–46.0)
Hemoglobin: 13.3 g/dL (ref 12.0–15.0)
Immature Granulocytes: 1 %
Lymphocytes Relative: 7 %
Lymphs Abs: 1 10*3/uL (ref 0.7–4.0)
MCH: 33.6 pg (ref 26.0–34.0)
MCHC: 33.4 g/dL (ref 30.0–36.0)
MCV: 100.5 fL — ABNORMAL HIGH (ref 78.0–100.0)
Monocytes Absolute: 0.6 10*3/uL (ref 0.1–1.0)
Monocytes Relative: 4 %
Neutro Abs: 12.4 10*3/uL — ABNORMAL HIGH (ref 1.7–7.7)
Neutrophils Relative %: 88 %
Platelets: 250 10*3/uL (ref 150–400)
RBC: 3.96 MIL/uL (ref 3.87–5.11)
RDW: 11.8 % (ref 11.5–15.5)
WBC: 14.1 10*3/uL — ABNORMAL HIGH (ref 4.0–10.5)

## 2018-07-10 LAB — BASIC METABOLIC PANEL
Anion gap: 8 (ref 5–15)
BUN: 24 mg/dL — ABNORMAL HIGH (ref 8–23)
CO2: 27 mmol/L (ref 22–32)
Calcium: 8.7 mg/dL — ABNORMAL LOW (ref 8.9–10.3)
Chloride: 109 mmol/L (ref 98–111)
Creatinine, Ser: 0.97 mg/dL (ref 0.44–1.00)
GFR calc Af Amer: >60 mL/min (ref >60)
GFR calc non Af Amer: 57 mL/min — ABNORMAL LOW (ref >60)
Glucose, Bld: 169 mg/dL — ABNORMAL HIGH (ref 70–99)
Potassium: 4.4 mmol/L (ref 3.5–5.1)
Sodium: 144 mmol/L (ref 135–145)

## 2018-07-10 LAB — MAGNESIUM: Magnesium: 2 mg/dL (ref 1.7–2.4)

## 2018-07-10 LAB — PHOSPHORUS: Phosphorus: 2.9 mg/dL (ref 2.5–4.6)

## 2018-07-10 MED ORDER — ACETAMINOPHEN 325 MG PO TABS: 650.0000 mg | ORAL_TABLET | Freq: Four times a day (QID) | ORAL | Status: DC | PRN

## 2018-07-10 MED ORDER — LEVETIRACETAM 500 MG PO TABS
1000.0000 mg | ORAL_TABLET | Freq: Two times a day (BID) | ORAL | Status: DC
  Administered 2018-07-10 – 2018-07-13 (×6): 1000 mg via ORAL
  Filled 2018-07-10 (×6): qty 2

## 2018-07-10 MED ORDER — PANTOPRAZOLE SODIUM 40 MG PO TBEC
40.0000 mg | DELAYED_RELEASE_TABLET | Freq: Every day | ORAL | Status: DC
  Administered 2018-07-10 – 2018-07-13 (×4): 40 mg via ORAL
  Filled 2018-07-10 (×5): qty 1

## 2018-07-10 MED ORDER — GERHARDT'S BUTT CREAM
TOPICAL_CREAM | Freq: Every day | CUTANEOUS | Status: DC
  Administered 2018-07-10 – 2018-07-13 (×4): via TOPICAL
  Filled 2018-07-10: qty 1

## 2018-07-10 MED ORDER — OXYCODONE HCL 5 MG PO TABS
10.0000 mg | ORAL_TABLET | Freq: Two times a day (BID) | ORAL | Status: DC | PRN
  Administered 2018-07-10: 10 mg via ORAL
  Filled 2018-07-10 (×2): qty 2

## 2018-07-10 MED FILL — Fentanyl Citrate Preservative Free (PF) Inj 100 MCG/2ML: INTRAMUSCULAR | Qty: 2 | Status: AC

## 2018-07-10 NOTE — Progress Notes (Signed)
Pt transferred with phone, charger, I pad, dentures, stuffed animal and balloon. Husband aware of pts new location.

## 2018-07-10 NOTE — Progress Notes (Signed)
Results of Rt ankle films discussed with Dr Nelda Marseille. He will consult ortho. Pt is to be NWB rt leg.

## 2018-07-10 NOTE — Plan of Care (Signed)

## 2018-07-10 NOTE — Care Management Note (Signed)
Case Management Note  Patient Details  Name: Alexis Edwards MRN: [id number] Date of Birth: 1946/10/15  Subjective/Objective:   Altered mental status with new onset seizures secondary to Left anterior frontal mass lesion  with extensive edema.                Action/Plan: NCM will follow for transition of care needs.  Expected Discharge Date:                  Expected Discharge Plan:     In-House Referral:     Discharge planning Services  CM Consult  Post Acute Care Choice:    Choice offered to:     DME Arranged:    DME Agency:     HH Arranged:    HH Agency:     Status of Service:  In process, will continue to follow  If discussed at Long Length of Stay Meetings, dates discussed:    Additional Comments:  Zenon Mayo, RN 07/10/2018, 3:52 PM

## 2018-07-10 NOTE — Progress Notes (Signed)
Report given to [facility], pt transferred to 4 No bed3.

## 2018-07-10 NOTE — Progress Notes (Signed)
Alexis Edwards  NLG:[id number] DOB: 1945-11-06 DOA: 07/08/2018 PCP: Leonides Sake, MD    LOS: 2 days   Reason for Consult / Chief Complaint:  Mass of brain with surrounding edema Intubated  Consulting MD and date:  Ulla Gallo- EDP at [place]   HPI/Summary of hospital stay:  72 yr old female with PMHx significant for CHF, previous Takotsubo episode in 2010,  Depression, HLD, HTN, Obesity, Obstructive Sleep Apnea presented to [facility] ED on 07/07/18 with chief complaint of altered mental status. Per ED documentation pt's family reports that around noon she was confused and by 1600 she had her first episode of generalized tonic clonic seizure for 1 min duration. EMS was called and on arrival pt was post ictal followed by moments of confusion mixed with episodes of clarity. The second  witnessed episode of seizure occurred at 17:52 in [place] ED and was described as staring to the right with jerking of all extremities and face. At 19:07 pt was noted to be more somnolent with sonorous respirations and difficult to arouse and a decision was made to intubate.    Vitals in [place] ED:  BP ranged from 137/60mmHg to 166/47mmHg  RR12-18 O2 Sat 93-100% and Temp 98.6 to 99.2 deg F   Subjective:  07/10/2018  - No events overnight, c/o right ankle pain  Objective   Blood pressure (!) 128/52, pulse 64, temperature 98.5 F (36.9 C), temperature source Oral, resp. rate 20, height 5\' 2"  (1.575 m), weight 104.1 kg, SpO2 99 %.        Intake/Output Summary (Last 24 hours) at 07/10/2018 1127 Last data filed at 07/10/2018 0814 Gross per 24 hour  Intake 334.94 ml  Output 775 ml  Net -440.06 ml   Filed Weights   07/08/18 0000 07/09/18 0600 07/10/18 0416  Weight: 104.6 kg 104.2 kg 104.1 kg   General Appearance:  Well appearing, NAD Head:  [place]/AT Eyes:  PERRL, EOM-I and MMM Ears:  WNL Nose:  No D/C Throat:  Clear Neck:  Supple, -LAN and -thyromegally Lungs:  CTA  bilaterally Heart:  RRR, Nl S1/S2 and -M/R/G Abdomen:  Soft, NT, ND and +BS Genitalia / Rectal:  Not done Extremities:  -edema, left ankle bruise and tenderness Skin:  Intact Neurologic:  Off sedation, alert and interactive, moving all ext to command, pain as above  LABS   PULMONARY Recent Labs  Lab 07/08/18 0107  PHART 7.426  PCO2ART 37.8  PO2ART 165.0*  HCO3 25.0  TCO2 26  O2SAT 100.0   CBC Recent Labs  Lab 07/08/18 0138 07/09/18 0209 07/10/18 0416  HGB 13.6 13.5 13.3  HCT 40.0 39.9 39.8  WBC 10.3 14.3* 14.1*  PLT 241 270 250   COAGULATION No results for input(s): INR in the last 168 hours.  CARDIAC   Recent Labs  Lab 07/09/18 0209  TROPONINI <0.03   No results for input(s): PROBNP in the last 168 hours.  CHEMISTRY Recent Labs  Lab 07/08/18 0138 07/08/18 2318 07/09/18 0209 07/10/18 0416  NA 139 140  --  144  K 4.2 3.9  --  4.4  CL 103 107  --  109  CO2 24 24  --  27  GLUCOSE 169* 167*  --  169*  BUN 12 21  --  24*  CREATININE 1.05* 0.86  --  0.97  CALCIUM 8.9 8.6*  --  8.7*  MG 1.8  --  2.1 2.0  PHOS  --   --  3.2 2.9   Estimated Creatinine Clearance: 59.3 mL/min (by C-G formula based on SCr of 0.97 mg/dL).  LIVER Recent Labs  Lab 07/08/18 0138 07/09/18 0209  AST 21 15  ALT 18 17  ALKPHOS 51 48  BILITOT 1.3* 1.0  PROT 6.3* 6.3*  ALBUMIN 3.4* 3.1*   INFECTIOUS Recent Labs  Lab 07/08/18 0138 07/08/18 2318  LATICACIDVEN  --  1.0  PROCALCITON <0.10  --    ENDOCRINE CBG (last 3)  Recent Labs    07/09/18 2357 07/10/18 0338 07/10/18 0827  GLUCAP 168* 186* 98   IMAGING x48h  - image(s) personally visualized  -   highlighted in bold Dg Chest Port 1 View  Result Date: 07/09/2018 CLINICAL DATA:  72 year old female on ventilator. Seizure. Subsequent encounter. EXAM: PORTABLE CHEST 1 VIEW COMPARISON:  07/08/2018. FINDINGS: Endotracheal tube tip 3.8 cm above the carina. Nasogastric tube courses below the diaphragm. Tip is not  included on the present exam. Cardiomegaly. Bilateral pleural effusions. Pulmonary vascular congestion. Bibasilar atelectasis. No pneumothorax detected. Post left shoulder replacement. Calcified tortuous aorta. IMPRESSION: 1. Endotracheal tube tip 3.8 cm above the carina. 2. Cardiomegaly, pulmonary vascular congestion and small bilateral pleural effusions without significant change. 3. Basilar subsegmental atelectasis. 4.  Aortic Atherosclerosis (ICD10-I70.0).  Calcified tortuous aorta. Electronically Signed   By: Genia Del M.D.   On: 07/09/2018 07:22     Recent Results (from the past 240 hour(s))  MRSA PCR Screening     Status: None   Collection Time: 07/08/18 12:33 AM  Result Value Ref Range Status   MRSA by PCR NEGATIVE NEGATIVE Final    Comment:        The GeneXpert MRSA Assay (FDA approved for NASAL specimens only), is one component of a comprehensive MRSA colonization surveillance program. It is not intended to diagnose MRSA infection nor to guide or monitor treatment for MRSA infections. Performed at [facility], [place] [address] [place], [place] [address]   Culture, Urine     Status: Abnormal   Collection Time: 07/08/18 12:44 AM  Result Value Ref Range Status   Specimen Description URINE, RANDOM  Final   Special Requests   Final    NONE Performed at [facility], [place] [address] [place], [place] [address]    Culture MULTIPLE SPECIES PRESENT, SUGGEST RECOLLECTION (A)  Final   Report Status 07/09/2018 FINAL  Final  Culture, respiratory (non-expectorated)     Status: None   Collection Time: 07/08/18  1:08 AM  Result Value Ref Range Status   Specimen Description TRACHEAL ASPIRATE  Final   Special Requests NONE  Final   Gram Stain NO WBC SEEN RARE GRAM POSITIVE COCCI   Final   Culture   Final    MODERATE Consistent with normal respiratory flora. Performed at [facility], [place] [address] [place], [place] [address]    Report Status 07/10/2018 FINAL   Final  Culture, blood (routine x 2)     Status: None (Preliminary result)   Collection Time: 07/08/18  1:35 AM  Result Value Ref Range Status   Specimen Description BLOOD LEFT ARM  Final   Special Requests   Final    BOTTLES DRAWN AEROBIC AND ANAEROBIC Blood Culture results may not be optimal due to an excessive volume of blood received in culture bottles   Culture   Final    NO GROWTH 1 DAY Performed at [facility], [place] [address], [place], [place] [address]  Report Status PENDING  Incomplete  Culture, blood (routine x 2)     Status: None (Preliminary result)   Collection Time: 07/08/18  1:40 AM  Result Value Ref Range Status   Specimen Description BLOOD LEFT HAND  Final   Special Requests   Final    BOTTLES DRAWN AEROBIC ONLY Blood Culture adequate volume   Culture   Final    NO GROWTH 1 DAY Performed at [facility], [address], [place], [place] [address]    Report Status PENDING  Incomplete     Consults: date of consult/date signed off & final recs:   Neurology: Cheral Marker MD awaiting recs  Procedures: None at this time  Significant Diagnostic Tests: CT Head  FINDINGS: Brain: Left anterior frontal mass lesion measuring 2.1 x 3.3 x 3.3 cm (AP x ML x CC series 2, image 13 and series 401, image 43). The mass has a large dural base and associated hypertrophy of the inner table of calvarium. There is mass effect on the adjacent brain and extensive edema throughout the left frontal lobe extending into the anterior basal ganglia and crossing genu of corpus callosum to the contralateral right frontal lobe. Mass effect from the lesion and edema results in 11 mm of left-to-right midline shift and partial effacement of the frontal horns of lateral ventricles. No downward herniation. No acute stroke or hemorrhage identified.  Vascular: Calcific atherosclerosis of the carotid siphons. No hyperdense vessels identified. Skull: Normal. Negative for fracture or  focal lesion. Sinuses/Orbits: No acute finding. Other: None. IMPRESSION: Left anterior frontal mass lesion measuring 3.3 cm, findings favor a dural base meningioma, but dural metastasis or cortical brain lesion is possible. MRI of the brain with and without contrast is recommended to further characterize. Extensive edema throughout the adjacent left frontal lobe. Associated mass effect with 11 mm of left-to-right midline shift.  Micro Data: Blood cx x 2 pending   Antimicrobials:  Not currently on antibiotics  Resolved Hospital Problem list    Assessment & Plan:   NEURO: Altered mental status with new onset seizures secondary to Left anterior frontal mass lesion 3.3cm with extensive edema Loaded with AED- Keppra post seizure activity continue with Keppra 1000mg  BID  PLAN - Continue Keppra - EEG per neuro at this point - No surgical interventions until recovered from abdominal surgery - D/C all sedation  Ankle pain and chronic pain - Right ankle x-ray - Oxycodone 10 mg q12 PRN  CARDIAC: Hemodynamically stable H/o CHF Last EF 65-70% EKG QTc 424 PR 170 low voltage QRS Cardiac Panel (last 3 results)  PLAN - Tele monitoring - D/C scheduled lasix  PULMONARY: Acute hypoxic respiratory failure in the setting of acute encephalopathy - Intubated on Mechanical ventilation  Plan - Titrate O2 for sat of 88-92% - Ambulate as able - IS per RT protocol - Flutter valve - Titrate O2 for sat of 88-92% - Ambulate  Endocrine: - TSH - No H/o insulin dependence - CBGs noted - Continue to monitor if BG >180mg /dl check Hgb A1c and start on ICU glycemic protocol  GI: Diet as ordered H/o diverticulosis  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  Disposition / Summary of Today's Plan 07/10/18   Transfer to SDU and to [facility] with PCCM off 9/18.  Discussed with TRH-MD.    DVT prophylaxis: SCDs GI prophylaxis: Protonix Diet: NPO Mobility:Bed rest Code Status: FULL Family Communication: son and grandaughter updated.   Rush Farmer, M.D. [facility] Pulmonary/Critical Care Medicine. Pager: [phone]. After hours pager: [phone].  07/10/2018 11:27 AM

## 2018-07-10 NOTE — Progress Notes (Signed)
Orthopedic Tech Progress Note Patient Details:  Alexis Edwards 1946-06-02 [id number]  Ortho Devices Type of Ortho Device: CAM walker Ortho Device/Splint Location: RLE Ortho Device/Splint Interventions: Application, Ordered   Post Interventions Patient Tolerated: Well Instructions Provided: Care of device   Braulio Bosch 07/10/2018, 4:44 PM

## 2018-07-10 NOTE — Progress Notes (Signed)
Patient refused CPAP.

## 2018-07-10 NOTE — Progress Notes (Signed)
Pt is a 72 yr old female with PMHx significant for CHF, previous Takotsubo episode in 2010,  Depression, HLD, HTN, Obesity, OSA who presented to [facility] ED on 07/07/18 with chief complaint of altered mental status. Pt was found to have a left frontal dural-based mass c/w likely meningioma. 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 Lilia Pro on 06/28/18. Dr. Lilia Pro asked that we evaluate the pt's midline incision to see if staples could be removed during this hospital stay. Pt has not complaints regarding midline incision.   Exam:  GI: abdomen is nondistended, midline wound with staples intact is well appearing and is without bleeding, drainage, surrounding erythema or or any signs of infection.   Staples will be removed today. Pt is to follow up with Dr. Lilia Pro.   Jackson Latino, [facility] Pager [phone]

## 2018-07-10 NOTE — Progress Notes (Signed)
Pt refusing CPAP for the night, RT will continue to monitor as needed. 

## 2018-07-10 NOTE — Consult Note (Addendum)
Reason for Consult:Right ankle fx Referring Physician: Tashiya Edwards is an 72 y.o. female.  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 She slid out of bed and thinks her husband and/or dog stepped on her ankle. She was admitted and required intubation. She began to c/o right ankle pain today and x-rays showed a distal fibula fx and possible medial malleolus avulsion and orthopedic surgery was consulted. She denies other extremity pain. 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  Past Medical History:  Diagnosis Date  . Arthritis   . CHF (congestive heart failure) ([place])   . COPD (chronic obstructive pulmonary disease) ([place])   . Depression   . Dyslipidemia   . GERD (gastroesophageal reflux disease)   . Heart murmur   . History of kidney stones   . HTN (hypertension)   . Hyperlipidemia   . Obesity    BMI 55  . Pneumonia    hx  . Sleep apnea    does not use cpap  . Sleep apnea   . Takotsubo syndrome April 2013   apical ballooning, [facility]    Past Surgical History:  Procedure Laterality Date  . ABDOMINAL HYSTERECTOMY    . CARDIAC CATHETERIZATION    . CHOLECYSTECTOMY  2/10  . REVERSE SHOULDER ARTHROPLASTY Left 02/24/2017   Procedure: REVERSE LEFT SHOULDER ARTHROPLASTY;  Surgeon: Netta Cedars, MD;  Location: [facility];  Service: Orthopedics;  Laterality: Left;  . TOTAL KNEE ARTHROPLASTY  5/12   Lt    Family History  Problem Relation Age of Onset  . Coronary artery disease Brother 50       stent  . Diabetes Mother   . Hypertension Mother   . Cancer Mother   . Coronary artery disease Sister   . Diabetes Sister   . Cancer Sister     Social History:  reports that she quit smoking about 31 years ago. Her smoking use included cigarettes. She has a 20.00 pack-year smoking history. She has never used smokeless tobacco. She reports that she drinks alcohol. She reports that she does not use drugs.  Allergies:  Allergies  Allergen  Reactions  . Macrobid [Nitrofurantoin] Itching and Rash  . Sulfa Antibiotics Itching and Rash    Medications: I have reviewed the patient's current medications.  Results for orders placed or performed during the hospital encounter of 07/08/18 (from the past 48 hour(s))  Glucose, capillary     Status: Abnormal   Collection Time: 07/08/18  4:08 PM  Result Value Ref Range   Glucose-Capillary 120 (H) 70 - 99 mg/dL  Glucose, capillary     Status: Abnormal   Collection Time: 07/08/18  8:16 PM  Result Value Ref Range   Glucose-Capillary 124 (H) 70 - 99 mg/dL   Comment 1 Notify RN    Comment 2 Document in Chart   Basic metabolic panel     Status: Abnormal   Collection Time: 07/08/18 11:18 PM  Result Value Ref Range   Sodium 140 135 - 145 mmol/L   Potassium 3.9 3.5 - 5.1 mmol/L   Chloride 107 98 - 111 mmol/L   CO2 24 22 - 32 mmol/L   Glucose, Bld 167 (H) 70 - 99 mg/dL   BUN 21 8 - 23 mg/dL   Creatinine, Ser 0.86 0.44 - 1.00 mg/dL   Calcium 8.6 (L) 8.9 - 10.3 mg/dL   GFR calc non Af Amer >60 >60 mL/min   GFR calc Af Amer >60 >60  mL/min    Comment: (NOTE) The eGFR has been calculated using the CKD EPI equation. This calculation has not been validated in all clinical situations. eGFR's persistently <60 mL/min signify possible Chronic Kidney Disease.    Anion gap 9 5 - 15    Comment: Performed at [facility] [address] [place], [place] [address]  Lactic acid, plasma     Status: None   Collection Time: 07/08/18 11:18 PM  Result Value Ref Range   Lactic Acid, Venous 1.0 0.5 - 1.9 mmol/L    Comment: Performed at [facility] [address], [place], [place] [address]  Glucose, capillary     Status: Abnormal   Collection Time: 07/08/18 11:33 PM  Result Value Ref Range   Glucose-Capillary 174 (H) 70 - 99 mg/dL   Comment 1 Notify RN    Comment 2 Document in Chart   CBC with Differential/Platelet     Status: Abnormal   Collection Time: 07/09/18  2:09 AM  Result  Value Ref Range   WBC 14.3 (H) 4.0 - 10.5 K/uL   RBC 4.06 3.87 - 5.11 MIL/uL   Hemoglobin 13.5 12.0 - 15.0 g/dL   HCT 39.9 36.0 - 46.0 %   MCV 98.3 78.0 - 100.0 fL   MCH 33.3 26.0 - 34.0 pg   MCHC 33.8 30.0 - 36.0 g/dL   RDW 11.5 11.5 - 15.5 %   Platelets 270 150 - 400 K/uL   Neutrophils Relative % 88 %   Neutro Abs 12.5 (H) 1.7 - 7.7 K/uL   Lymphocytes Relative 6 %   Lymphs Abs 0.9 0.7 - 4.0 K/uL   Monocytes Relative 5 %   Monocytes Absolute 0.7 0.1 - 1.0 K/uL   Eosinophils Relative 0 %   Eosinophils Absolute 0.0 0.0 - 0.7 K/uL   Basophils Relative 0 %   Basophils Absolute 0.0 0.0 - 0.1 K/uL   Immature Granulocytes 1 %   Abs Immature Granulocytes 0.1 0.0 - 0.1 K/uL    Comment: Performed at [facility] [address] [place], [place] [address]  Magnesium     Status: None   Collection Time: 07/09/18  2:09 AM  Result Value Ref Range   Magnesium 2.1 1.7 - 2.4 mg/dL    Comment: Performed at [facility] [address] [place], [place] [address]  Phosphorus     Status: None   Collection Time: 07/09/18  2:09 AM  Result Value Ref Range   Phosphorus 3.2 2.5 - 4.6 mg/dL    Comment: Performed at [facility] [address] [place], [place] [address]  Troponin I     Status: None   Collection Time: 07/09/18  2:09 AM  Result Value Ref Range   Troponin I <0.03 <0.03 ng/mL    Comment: Performed at [facility] [address], [place], [place] [address]  Hepatic function panel     Status: Abnormal   Collection Time: 07/09/18  2:09 AM  Result Value Ref Range   Total Protein 6.3 (L) 6.5 - 8.1 g/dL   Albumin 3.1 (L) 3.5 - 5.0 g/dL   AST 15 15 - 41 U/L   ALT 17 0 - 44 U/L   Alkaline Phosphatase 48 38 - 126 U/L   Total Bilirubin 1.0 0.3 - 1.2 mg/dL   Bilirubin, Direct 0.2 0.0 - 0.2 mg/dL   Indirect Bilirubin 0.8 0.3 - 0.9 mg/dL    Comment: Performed at  [facility], [place] [address], [place], [place] [address]  Glucose, capillary     Status: Abnormal    Collection Time: 07/09/18  4:06 AM  Result Value Ref Range   Glucose-Capillary 155 (H) 70 - 99 mg/dL   Comment 1 Notify RN    Comment 2 Document in Chart   Glucose, capillary     Status: Abnormal   Collection Time: 07/09/18  8:18 AM  Result Value Ref Range   Glucose-Capillary 163 (H) 70 - 99 mg/dL   Comment 1 Notify RN    Comment 2 Document in Chart   Glucose, capillary     Status: Abnormal   Collection Time: 07/09/18 11:39 AM  Result Value Ref Range   Glucose-Capillary 129 (H) 70 - 99 mg/dL   Comment 1 Notify RN    Comment 2 Document in Chart   Glucose, capillary     Status: Abnormal   Collection Time: 07/09/18  4:08 PM  Result Value Ref Range   Glucose-Capillary 131 (H) 70 - 99 mg/dL   Comment 1 Notify RN    Comment 2 Document in Chart   Glucose, capillary     Status: Abnormal   Collection Time: 07/09/18  7:58 PM  Result Value Ref Range   Glucose-Capillary 117 (H) 70 - 99 mg/dL  Glucose, capillary     Status: Abnormal   Collection Time: 07/09/18 11:57 PM  Result Value Ref Range   Glucose-Capillary 168 (H) 70 - 99 mg/dL  Glucose, capillary     Status: Abnormal   Collection Time: 07/10/18  3:38 AM  Result Value Ref Range   Glucose-Capillary 186 (H) 70 - 99 mg/dL  CBC with Differential/Platelet     Status: Abnormal   Collection Time: 07/10/18  4:16 AM  Result Value Ref Range   WBC 14.1 (H) 4.0 - 10.5 K/uL   RBC 3.96 3.87 - 5.11 MIL/uL   Hemoglobin 13.3 12.0 - 15.0 g/dL   HCT 39.8 36.0 - 46.0 %   MCV 100.5 (H) 78.0 - 100.0 fL   MCH 33.6 26.0 - 34.0 pg   MCHC 33.4 30.0 - 36.0 g/dL   RDW 11.8 11.5 - 15.5 %   Platelets 250 150 - 400 K/uL   Neutrophils Relative % 88 %   Neutro Abs 12.4 (H) 1.7 - 7.7 K/uL   Lymphocytes Relative 7 %   Lymphs Abs 1.0 0.7 - 4.0 K/uL   Monocytes Relative 4 %   Monocytes Absolute 0.6 0.1 - 1.0 K/uL   Eosinophils Relative 0 %   Eosinophils Absolute 0.0 0.0 - 0.7 K/uL   Basophils Relative 0 %   Basophils Absolute 0.0 0.0 - 0.1 K/uL    Immature Granulocytes 1 %   Abs Immature Granulocytes 0.1 0.0 - 0.1 K/uL    Comment: Performed at [facility], [address] [place], [place] [address]  Magnesium     Status: None   Collection Time: 07/10/18  4:16 AM  Result Value Ref Range   Magnesium 2.0 1.7 - 2.4 mg/dL    Comment: Performed at [facility] [address] [place],  [address]  Phosphorus     Status: None   Collection Time: 07/10/18  4:16 AM  Result Value Ref Range   Phosphorus 2.9 2.5 - 4.6 mg/dL    Comment: Performed at [facility] [address], [place],  [address]  Basic metabolic panel     Status: Abnormal   Collection  Time: 07/10/18  4:16 AM  Result Value Ref Range   Sodium 144 135 - 145 mmol/L   Potassium 4.4 3.5 - 5.1 mmol/L   Chloride 109 98 - 111 mmol/L   CO2 27 22 - 32 mmol/L   Glucose, Bld 169 (H) 70 - 99 mg/dL   BUN 24 (H) 8 - 23 mg/dL   Creatinine, Ser 0.97 0.44 - 1.00 mg/dL   Calcium 8.7 (L) 8.9 - 10.3 mg/dL   GFR calc non Af Amer 57 (L) >60 mL/min   GFR calc Af Amer >60 >60 mL/min    Comment: (NOTE) The eGFR has been calculated using the CKD EPI equation. This calculation has not been validated in all clinical situations. eGFR's persistently <60 mL/min signify possible Chronic Kidney Disease.    Anion gap 8 5 - 15    Comment: Performed at [facility] [address], [place], [place] [address]  Glucose, capillary     Status: None   Collection Time: 07/10/18  8:27 AM  Result Value Ref Range   Glucose-Capillary 98 70 - 99 mg/dL   Comment 1 Notify RN    Comment 2 Document in Chart   Glucose, capillary     Status: Abnormal   Collection Time: 07/10/18 12:20 PM  Result Value Ref Range   Glucose-Capillary 145 (H) 70 - 99 mg/dL    Dg Ankle 2 Views Right  Result Date: 07/10/2018 CLINICAL DATA:  Fall.  Persistent pain.  Bruising. EXAM: RIGHT ANKLE - 2 VIEW COMPARISON:  No recent prior. FINDINGS: An oblique slightly displaced acute appearing fracture  of the distal right fibula noted. Tiny bony density noted adjacent to the medial malleolus is could represent tiny avulsion fracture. Age is undetermined. Diffuse degenerative change. Diffuse soft tissue swelling. Venous calcification noted. IMPRESSION: Oblique slightly displaced fracture of the distal right fibula. Tiny avulsion fracture of the distal tip of the medial malleolus. Electronically Signed   By: Marcello Moores  Register   On: 07/10/2018 12:31   Dg Chest Port 1 View  Result Date: 07/09/2018 CLINICAL DATA:  72 year old female on ventilator. Seizure. Subsequent encounter. EXAM: PORTABLE CHEST 1 VIEW COMPARISON:  07/08/2018. FINDINGS: Endotracheal tube tip 3.8 cm above the carina. Nasogastric tube courses below the diaphragm. Tip is not included on the present exam. Cardiomegaly. Bilateral pleural effusions. Pulmonary vascular congestion. Bibasilar atelectasis. No pneumothorax detected. Post left shoulder replacement. Calcified tortuous aorta. IMPRESSION: 1. Endotracheal tube tip 3.8 cm above the carina. 2. Cardiomegaly, pulmonary vascular congestion and small bilateral pleural effusions without significant change. 3. Basilar subsegmental atelectasis. 4.  Aortic Atherosclerosis (ICD10-I70.0).  Calcified tortuous aorta. Electronically Signed   By: Genia Del M.D.   On: 07/09/2018 07:22    Review of Systems  Constitutional: Negative for weight loss.  HENT: Negative for ear discharge, ear pain, hearing loss and tinnitus.   Eyes: Negative for blurred vision, double vision, photophobia and pain.  Respiratory: Negative for cough, sputum production and shortness of breath.   Cardiovascular: Negative for chest pain.  Gastrointestinal: Negative for abdominal pain, nausea and vomiting.  Genitourinary: Negative for dysuria, flank pain, frequency and urgency.  Musculoskeletal: Positive for joint pain (Right ankle). Negative for back pain, falls, myalgias and neck pain.  Neurological: Negative for dizziness,  tingling, sensory change, focal weakness, loss of consciousness and headaches.  Endo/Heme/Allergies: Does not bruise/bleed easily.  Psychiatric/Behavioral: Negative for depression, memory loss and substance abuse. The patient is not nervous/anxious.    Blood pressure (!) 110/38, pulse [name])  58, temperature 97.8 F (36.6 C), temperature source Oral, resp. rate (!) 21, height 5' 2"  (1.575 m), weight 104.1 kg, SpO2 95 %. Physical Exam  Constitutional: She appears well-developed and well-nourished. No distress.  HENT:  Head: Normocephalic and atraumatic.  Eyes: Conjunctivae are normal. Right eye exhibits no discharge. Left eye exhibits no discharge. No scleral icterus.  Neck: Normal range of motion.  Cardiovascular: Normal rate and regular rhythm.  Respiratory: Effort normal. No respiratory distress.  Musculoskeletal:  RLE No traumatic wounds, ecchymosis, or rash  Lateral lower leg TTP  No knee or ankle effusion  Knee stable to varus/ valgus and anterior/posterior stress  Sens DPN, SPN, TN intact  Motor EHL, ext, flex, evers 5/5  DP 2+, PT 2+, No significant edema  Neurological: She is alert.  Skin: Skin is warm and dry. She is not diaphoretic.  Psychiatric: She has a normal mood and affect. Her behavior is normal.    Assessment/Plan: Right ankle fx -- For now CAM walker and NWB. It's possible she may need surgery for this but can be done on an elective basis. Dr. Mardelle Matte to evaluate and provide further recommendations.    Alexis Abu, PA-C Orthopedic Surgery [phone] 07/10/2018, 3:13 PM   Patient seen and examined, absolutely no medial tenderness or bruising.  Plan for cam boot, brain surgery in 2 weeks, repeat xrays in a week, mortise appears stable, ok to be WBAT in boot, high fall risk, plan nonoperative management for now.  Alexis Bridge, MD 12:50 AM

## 2018-07-11 ENCOUNTER — Encounter (HOSPITAL_COMMUNITY): Payer: Self-pay | Admitting: *Deleted

## 2018-07-11 ENCOUNTER — Other Ambulatory Visit: Payer: Self-pay

## 2018-07-11 DIAGNOSIS — I5022 Chronic systolic (congestive) heart failure: Secondary | ICD-10-CM

## 2018-07-11 LAB — BASIC METABOLIC PANEL
Anion gap: 11 (ref 5–15)
BUN: 20 mg/dL (ref 8–23)
CO2: 25 mmol/L (ref 22–32)
Calcium: 8.9 mg/dL (ref 8.9–10.3)
Chloride: 107 mmol/L (ref 98–111)
Creatinine, Ser: 0.93 mg/dL (ref 0.44–1.00)
GFR calc Af Amer: >60 mL/min (ref >60)
GFR calc non Af Amer: 60 mL/min — ABNORMAL LOW (ref >60)
Glucose, Bld: 140 mg/dL — ABNORMAL HIGH (ref 70–99)
Potassium: 4.1 mmol/L (ref 3.5–5.1)
Sodium: 143 mmol/L (ref 135–145)

## 2018-07-11 LAB — CBC WITH DIFFERENTIAL/PLATELET
Abs Immature Granulocytes: 0.1 10*3/uL (ref 0.0–0.1)
Basophils Absolute: 0 10*3/uL (ref 0.0–0.1)
Basophils Relative: 0 %
Eosinophils Absolute: 0 10*3/uL (ref 0.0–0.7)
Eosinophils Relative: 0 %
HCT: 41.3 % (ref 36.0–46.0)
Hemoglobin: 13.9 g/dL (ref 12.0–15.0)
Immature Granulocytes: 1 %
Lymphocytes Relative: 7 %
Lymphs Abs: 0.9 10*3/uL (ref 0.7–4.0)
MCH: 33.3 pg (ref 26.0–34.0)
MCHC: 33.7 g/dL (ref 30.0–36.0)
MCV: 99 fL (ref 78.0–100.0)
Monocytes Absolute: 0.5 10*3/uL (ref 0.1–1.0)
Monocytes Relative: 4 %
Neutro Abs: 10.9 10*3/uL — ABNORMAL HIGH (ref 1.7–7.7)
Neutrophils Relative %: 88 %
Platelets: 260 10*3/uL (ref 150–400)
RBC: 4.17 MIL/uL (ref 3.87–5.11)
RDW: 11.8 % (ref 11.5–15.5)
WBC: 12.3 10*3/uL — ABNORMAL HIGH (ref 4.0–10.5)

## 2018-07-11 LAB — MAGNESIUM: Magnesium: 2.1 mg/dL (ref 1.7–2.4)

## 2018-07-11 LAB — GLUCOSE, CAPILLARY
Glucose-Capillary: 133 mg/dL — ABNORMAL HIGH (ref 70–99)
Glucose-Capillary: 127 mg/dL — ABNORMAL HIGH (ref 70–99)
Glucose-Capillary: 210 mg/dL — ABNORMAL HIGH (ref 70–99)
Glucose-Capillary: 190 mg/dL — ABNORMAL HIGH (ref 70–99)
Glucose-Capillary: 169 mg/dL — ABNORMAL HIGH (ref 70–99)

## 2018-07-11 LAB — PHOSPHORUS: Phosphorus: 3.1 mg/dL (ref 2.5–4.6)

## 2018-07-11 NOTE — Evaluation (Signed)
Physical Therapy Evaluation Patient Details Name: Alexis Edwards MRN: [id number] DOB: 1945-11-28 Today's Date: 07/11/2018   History of Present Illness  72 y.o. woman presented with seizure,, CT/MRI showed left frontal dural-based mass c/w likely meningioma.  Intubated 1-2 days.  PMH: HTN CHF, COPD  Clinical Impression  Pt is likely close to baseline functioning and should be safe at home with her husband's assist. There are no further acute PT needs.  Will await for any potential orders post surgical intervention  Will sign off at this time.     Follow Up Recommendations No PT follow up    Equipment Recommendations  None recommended by PT    Recommendations for Other Services       Precautions / Restrictions Precautions Precaution Comments: fx of Left distal fibula Required Braces or Orthoses: Other Brace/Splint Other Brace/Splint: camwalker boot for ambulation Restrictions RLE Weight Bearing: Weight bearing as tolerated      Mobility  Bed Mobility Overal bed mobility: Modified Independent                Transfers Overall transfer level: Needs assistance Equipment used: Rolling walker (2 wheeled) Transfers: Sit to/from Stand Sit to Stand: Supervision            Ambulation/Gait Ambulation/Gait assistance: Supervision Gait Distance (Feet): 280 Feet Assistive device: None   Gait velocity: slower Gait velocity interpretation: 1.31 - 2.62 ft/sec, indicative of limited community ambulator General Gait Details: generally steady even with the leg length discrepancy of the camwalker boot.  Stairs Stairs: Yes Stairs assistance: Min guard Stair Management: One rail Right Number of Stairs: 2 General stair comments: steady with the rail  Wheelchair Mobility    Modified Rankin (Stroke Patients Only)       Balance Overall balance assessment: No apparent balance deficits (not formally assessed)                                            Pertinent Vitals/Pain Pain Assessment: No/denies pain    Home Living Family/patient expects to be discharged to:: Private residence Living Arrangements: Spouse/significant other Available Help at Discharge: Family Type of Home: House Home Access: Stairs to enter Entrance Stairs-Rails: Psychiatric nurse of Steps: 4 Home Layout: One level Home Equipment: Cane - single point      Prior Function Level of Independence: Independent               Hand Dominance        Extremity/Trunk Assessment   Upper Extremity Assessment Upper Extremity Assessment: Overall WFL for tasks assessed    Lower Extremity Assessment Lower Extremity Assessment: Overall WFL for tasks assessed       Communication   Communication: No difficulties  Cognition Arousal/Alertness: Awake/alert Behavior During Therapy: WFL for tasks assessed/performed Overall Cognitive Status: Within Functional Limits for tasks assessed                                        General Comments General comments (skin integrity, edema, etc.): vss    Exercises     Assessment/Plan    PT Assessment Patent does not need any further PT services  PT Problem List         PT Treatment Interventions      PT Goals (Current goals  can be found in the Care Plan section)  Acute Rehab PT Goals Patient Stated Goal: prepare for upcoming surgery PT Goal Formulation: All assessment and education complete, DC therapy    Frequency     Barriers to discharge        Co-evaluation               AM-PAC PT "6 Clicks" Daily Activity  Outcome Measure Difficulty turning over in bed (including adjusting bedclothes, sheets and blankets)?: None Difficulty moving from lying on back to sitting on the side of the bed? : None Difficulty sitting down on and standing up from a chair with arms (e.g., wheelchair, bedside commode, etc,.)?: A Little Help needed moving to and from a bed to chair  (including a wheelchair)?: A Little Help needed walking in hospital room?: A Little Help needed climbing 3-5 steps with a railing? : A Little 6 Click Score: 20    End of Session   Activity Tolerance: Patient tolerated treatment well Patient left: in bed;with call bell/phone within reach;with family/visitor present Nurse Communication: Mobility status PT Visit Diagnosis: Difficulty in walking, not elsewhere classified (R26.2)    Time: 1637-1700 PT Time Calculation (min) (ACUTE ONLY): 23 min   Charges:   PT Evaluation $PT Eval Low Complexity: 1 Low PT Treatments $Gait Training: 8-22 mins        07/11/2018  Donnella Sham, PT Acute Rehabilitation Services [phone]  (pager) [phone]  (office)  Tessie Fass [place]tinger 07/11/2018, 6:34 PM

## 2018-07-11 NOTE — Care Management Important Message (Signed)
Important Message  Patient Details  Name: Alexis Edwards MRN: [id number] Date of Birth: [birth year]   Medicare Important Message Given:  Yes    Orbie Pyo 07/11/2018, 3:19 PM

## 2018-07-11 NOTE — Progress Notes (Signed)
PROGRESS NOTE    Patient: Alexis Edwards                            PCP: Leonides Sake, MD                    DOB: [birth year]            DOA: 07/08/2018 SEG:[id number]             DOS: 07/11/2018, 12:11 PM   LOS: 3 days   Date of Service: The patient was seen and examined on 07/11/2018  Subjective:   Patient was seen and examined this morning, in chair, accompanied by her husband, no acute issues overnight.  Patient still on IV steroids  Brief Narrative:   72 yr old female with PMHx significant for CHF, previous Takotsubo episode in 2010,  Depression, HLD, HTN, Obesity, Obstructive Sleep Apnea presented to [facility] ED on 07/07/18 with chief complaint of altered mental status. Per ED documentation pt's family reports that around noon she was confused and by 1600 she had her first episode of generalized tonic clonic seizure for 1 min duration. EMS was called and on arrival pt was post ictal followed by moments of confusion mixed with episodes of clarity. The second  witnessed episode of seizure occurred at 17:52 in [facility] ED and was described as staring to the right with jerking of all extremities and face. At 19:07 pt was noted to be more somnolent with sonorous respirations and difficult to arouse and a decision was made to intubate.   Patient was subsequently stabilized, was successfully extubated,  on IV Decadron, IV Keppra, which was subsequently was switched to p.o.  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 Keppra 1000 mg p.o. twice daily, -Continue to be on IV Decadron for cerebral edema -proving awake alert, following commands  -Completed EEG, neurology following - EEG per neuro at this point - No surgical interventions until recovered from abdominal surgery   Right ankle pain and chronic pain/finding consistent distal fibula fracture -Ortho consulted, following recommended ORIF possibly in 2 weeks.,  Continue orthotics  History of systolic congestive heart failure -Stable, denies any chest pain or shortness of breath Last EF 65-70% EKG QTc 424 PR 170 low voltage QRS -Enzymes negative - Holding scheduled lasix   Acute hypoxic respiratory failure in the setting of acute encephalopathy - - Was  Intubated on Mechanical ventilation -subsequently was titrated down, extubated on 07/10/2018 - IS per RT protocol - Flutter valve - Titrate O2 for sat of 88-92% - Ambulate   Hyperglycemia  -Likely due to steroids , hemoglobin A1c 5.1, blood sugars improving  -SSI    History of diverticulosis/diverticulitis -Status post recent colon resection, 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   DVT prophylaxis: Heparin SQ  Code  Status:   Code Status: Full Code  Family Communication:  The above findings and plan of care has been discussed with patient and family in detail, they expressed understanding and agreement of above.  Disposition Plan:   Anticipated 1-2 days  Consultants: Neurology, PCCM,  Procedures:   Intubation/extubated   Objective:   Vitals:   07/10/18 2235 07/11/18 0400 07/11/18 0500 07/11/18 0738  BP: 126/62 (!) 117/50  124/65  Pulse: (!) 52   (!) 58  Resp: 19 18  17   Temp: 98 F (36.7 C) 97.8 F (36.6 C)  (!) 97.5 F (36.4 C)  TempSrc: Oral Oral  Oral  SpO2: 95% 91%  93%  Weight:   103.9 kg   Height:   5\' 2"  (1.575 m)     Intake/Output Summary (Last 24 hours) at 07/11/2018 1211 Last data filed at 07/11/2018 0505 Gross per 24 hour  Intake 152.04 ml  Output 1 ml  Net 151.04 ml   Filed Weights   07/09/18 0600 07/10/18 0416 07/11/18 0500  Weight: 104.2 kg 104.1 kg 103.9 kg     Examination:    General exam: Appears calm and comfortable  BP 124/65 (BP Location: Right Arm)   Pulse (!) 58   Temp (!) 97.5 F (36.4 C) (Oral)   Resp 17   Ht 5\' 2"  (1.575 m)   Wt 103.9 kg   LMP  (LMP Unknown)   SpO2 93%   BMI 41.90 kg/m    Physical Exam  Constitution:  Alert, cooperative, no distress,  Psychiatric: Normal and stable mood and affect, cognition intact,   HEENT: Normocephalic, PERRL, otherwise with in Normal limits  Chest:Chest symmetric Cardio vascular:  S1/S2, RRR, No murmure, No Rubs or Gallops  pulmonary: Clear to auscultation bilaterally, respirations unlabored, negative wheezes / crackles Abdomen: Soft, non-tender, non-distended, bowel sounds,no masses, no organomegaly Muscular skeletal: Limited exam - in bed, able to move all 4 extremities, Normal strength,  Neuro: CNII-XII intact. , normal motor and sensation, reflexes intact  Extremities: No pitting edema lower extremities, +2 pulses right foot/ankle and boots Skin: Dry, warm to touch, negative for any Rashes,  normal surgical wound, staples removed, healing, clean dry no discharge Wounds: per nursing documentation

## 2018-07-11 NOTE — Progress Notes (Signed)
Patient refusing CPAP. Pt states that she does not use a CPAP at night to sleep with. No distress or complications noted. RN at bedside.

## 2018-07-11 NOTE — Progress Notes (Signed)
I saw in Dr. Luanna Cole note that the patient was asking about her meningioma. I went by this morning to discuss it with her, but she said she already knew about it and didn't have any questions. I reiterated the plan that we would let her recover from her abdominal surgery and schedule her craniotomy in a few weeks. I would prefer her to be weight bearing for post-op ambulation if possible. She should continue taking her antiepileptic medication and follow up with me in clinic in two weeks and can call [phone] to schedule an appointment. Given that she had an episode of status epilepticus, she should also follow up with someone from her neurology team in this admission, as weaning off her AEDs will likely be a more involved process.   Judith Part, MD

## 2018-07-12 LAB — CBC WITH DIFFERENTIAL/PLATELET
Abs Immature Granulocytes: 0.2 10*3/uL — ABNORMAL HIGH (ref 0.0–0.1)
Abs Immature Granulocytes: 0.2 10*3/uL — ABNORMAL HIGH (ref 0.0–0.1)
Basophils Absolute: 0 10*3/uL (ref 0.0–0.1)
Basophils Absolute: 0 10*3/uL (ref 0.0–0.1)
Basophils Relative: 0 %
Basophils Relative: 0 %
Eosinophils Absolute: 0 10*3/uL (ref 0.0–0.7)
Eosinophils Absolute: 0 10*3/uL (ref 0.0–0.7)
Eosinophils Relative: 0 %
Eosinophils Relative: 0 %
HCT: 43.2 % (ref 36.0–46.0)
HCT: 41.9 % (ref 36.0–46.0)
Hemoglobin: 14.4 g/dL (ref 12.0–15.0)
Hemoglobin: 13.9 g/dL (ref 12.0–15.0)
Immature Granulocytes: 1 %
Immature Granulocytes: 1 %
Lymphocytes Relative: 5 %
Lymphocytes Relative: 5 %
Lymphs Abs: 0.9 10*3/uL (ref 0.7–4.0)
Lymphs Abs: 0.9 10*3/uL (ref 0.7–4.0)
MCH: 33.3 pg (ref 26.0–34.0)
MCH: 33.3 pg (ref 26.0–34.0)
MCHC: 33.3 g/dL (ref 30.0–36.0)
MCHC: 33.2 g/dL (ref 30.0–36.0)
MCV: 100 fL (ref 78.0–100.0)
MCV: 100.5 fL — ABNORMAL HIGH (ref 78.0–100.0)
Monocytes Absolute: 0.7 10*3/uL (ref 0.1–1.0)
Monocytes Absolute: 0.6 10*3/uL (ref 0.1–1.0)
Monocytes Relative: 4 %
Monocytes Relative: 3 %
Neutro Abs: 15.2 10*3/uL — ABNORMAL HIGH (ref 1.7–7.7)
Neutro Abs: 16.1 10*3/uL — ABNORMAL HIGH (ref 1.7–7.7)
Neutrophils Relative %: 90 %
Neutrophils Relative %: 91 %
Platelets: 293 10*3/uL (ref 150–400)
Platelets: 275 10*3/uL (ref 150–400)
RBC: 4.32 MIL/uL (ref 3.87–5.11)
RBC: 4.17 MIL/uL (ref 3.87–5.11)
RDW: 11.8 % (ref 11.5–15.5)
RDW: 12 % (ref 11.5–15.5)
WBC: 17.1 10*3/uL — ABNORMAL HIGH (ref 4.0–10.5)
WBC: 17.8 10*3/uL — ABNORMAL HIGH (ref 4.0–10.5)

## 2018-07-12 LAB — GLUCOSE, CAPILLARY
Glucose-Capillary: 128 mg/dL — ABNORMAL HIGH (ref 70–99)
Glucose-Capillary: 133 mg/dL — ABNORMAL HIGH (ref 70–99)
Glucose-Capillary: 140 mg/dL — ABNORMAL HIGH (ref 70–99)
Glucose-Capillary: 147 mg/dL — ABNORMAL HIGH (ref 70–99)
Glucose-Capillary: 181 mg/dL — ABNORMAL HIGH (ref 70–99)
Glucose-Capillary: 189 mg/dL — ABNORMAL HIGH (ref 70–99)
Glucose-Capillary: 209 mg/dL — ABNORMAL HIGH (ref 70–99)

## 2018-07-12 LAB — MAGNESIUM: Magnesium: 1.9 mg/dL (ref 1.7–2.4)

## 2018-07-12 LAB — PHOSPHORUS: Phosphorus: 2.8 mg/dL (ref 2.5–4.6)

## 2018-07-12 NOTE — Plan of Care (Signed)

## 2018-07-12 NOTE — Plan of Care (Signed)

## 2018-07-12 NOTE — Progress Notes (Signed)
PROGRESS NOTE    Patient: Alexis Edwards                            PCP: Leonides Sake, MD                    DOB: 23-Jun-1946            DOA: 07/08/2018 WUJ:[id number]             DOS: 07/12/2018, 11:09 AM   LOS: 4 days   Date of Service: The patient was seen and examined on 07/12/2018  Subjective:   She was seen and examined this morning, stable, accompanied by her husband.  No issues overnight. -Keppra has been switched to p.o., no episodes of seizures overnight. -Still on IV Decadron -Patient was noted for leukocytosis secondary possibly Decadron and mildly bradycardia of heart rate of 45, asymptomatic.  Brief Narrative:   [name] is a 73 yr old female with PMHx significant for CHF, previous Takotsubo episode in 2010,  Depression, HLD, HTN, Obesity, Obstructive Sleep Apnea presented to [facility] ED on 07/07/18 with chief complaint of altered mental status. Per ED documentation pt's family reports that around noon she was confused and by 1600 she had her first episode of generalized tonic clonic seizure for 1 min duration. EMS was called and on arrival pt was post ictal followed by moments of confusion mixed with episodes of clarity. The second  witnessed episode of seizure occurred at 17:52 in [facility] and was described as staring to the right with jerking of all extremities and face. At 19:07 pt was noted to be more somnolent with sonorous respirations and difficult to arouse and a decision was made to intubate.   Patient was subsequently stabilized, was successfully extubated,  on IV Decadron, IV Keppra, which was subsequently was switched to p.o.  Active Problems:   OSA (obstructive sleep apnea)   Cerebral edema (HCC)   Brain mass   Endotracheally intubated   Chronic congestive heart failure ([place])   Encounter for orogastric (OG) tube placement   Seizure ([place])   Pressure injury of skin   Acute respiratory failure with hypoxemia (HCC)   Acute  encephalopathy    Assessment & Plan:     Altered mental status/new onset seizures/status epilepticus due to left anterior frontal mass lesion 3.3 cm with extensive edema -Status post ICU admission for altered mental status for airway protection intubated subsequently extubated -was transferred out of ICU on 9/18 -Remains  stable on O2 via nasal cannula, -Status epilepticus has improved, status post loading with IV Keppra, now on p.o. Keppra 1000 mg p.o. twice daily, -Continue to be on IV Decadron for cerebral edema -proving awake alert, following commands  -Completed EEG, neurology following - EEG per neuro at this point - No surgical interventions until recovered from abdominal surgery -Per patient likely to be evaluated for resection in 2 weeks  Right ankle pain and chronic pain/finding consistent distal fibula fracture -Ortho consulted, following recommended ORIF possibly in 2 weeks.,  Continue orthotics  Bradycardia -Heart rate 45-64, currently 45  -symptomatically we will continue to monitor  History of systolic congestive heart failure -Stable, not complaining of shortness of breath or chest pain Last EF 65-70% EKG QTc 424 PR 170 low voltage QRS -Enzymes negative - Holding scheduled lasix   Acute hypoxic respiratory failure in the setting of acute encephalopathy - -stable on O2 via nasal  cannula - Was  Intubated on Mechanical ventilation -subsequently was titrated down, extubated on 07/10/2018 - IS per RT protocol - Flutter valve - Titrate O2 for sat of 88-92%, eventually to room air - Ambulate   Hyperglycemia  -Likely due to steroids , hemoglobin A1c 5.1, blood sugars improving  -SSI   History of diverticulosis/diverticulitis -Status post recent colon resection, 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   DVT prophylaxis: Heparin SQ  Code Status:   Code Status: Full Code  Family Communication:  The above findings and plan of care has been discussed with patient and family in detail, they expressed understanding and agreement of above.  Disposition Plan:   Anticipated 1-2 days  Consultants: Neurology, PCCM,  Procedures:   Intubation/extubated   Objective:   Vitals:   07/12/18 0450 07/12/18 0500 07/12/18 0741 07/12/18 0800  BP: (!) 141/51  127/69   Pulse: (!) 55  (!) 52 (!) 45  Resp: 16  16 14   Temp: 97.8 F (36.6 C)  97.6 F (36.4 C)   TempSrc: Oral  Oral   SpO2: 96%  97% 97%  Weight:  104.2 kg    Height:        Intake/Output Summary (Last 24 hours) at 07/12/2018 1109 Last data filed at 07/11/2018 1217 Gross per 24 hour  Intake 240 ml  Output -  Net 240 ml   Filed Weights   07/10/18 0416 07/11/18 0500 07/12/18 0500  Weight: 104.1 kg 103.9 kg 104.2 kg     Examination:   BP 127/69 (BP Location: Right Arm)   Pulse (!) 45   Temp 97.6 F (36.4 C) (Oral)   Resp 14   Ht 5\' 2"  (1.575 m)   Wt 104.2 kg   LMP  (LMP Unknown)   SpO2 97%   BMI 42.02 kg/m    Physical Exam  Constitution:  Alert, cooperative, no distress,  Psychiatric: Normal and stable mood and affect, cognition intact,   HEENT: Normocephalic, PERRL, otherwise with in Normal limits  Chest:Chest symmetric Cardio vascular:  S1/S2, RRR, No murmure, No Rubs or Gallops  pulmonary: Clear to auscultation bilaterally, respirations unlabored, negative wheezes / crackles Abdomen: Soft, non-tender, non-distended,  bowel sounds,no masses, no organomegaly Muscular skeletal: Limited exam - in bed, able to move all 4 extremities, Normal strength, ankle tenderness, range of motion limited due to pain Neuro: CNII-XII intact. , normal motor and sensation, reflexes intact  Extremities: No pitting edema lower extremities, +2 pulses , no ankle tenderness Skin: Dry, warm to touch, negative for any Rashes, No open wounds Wounds: per nursing documentation     CBC Latest Ref Rng & Units 07/12/2018 07/11/2018 07/10/2018  WBC 4.0 - 10.5 K/uL 17.1(H) 12.3(H) 14.1(H)  Hemoglobin 12.0 - 15.0 g/dL 14.4 13.9 13.3  Hematocrit 36.0 - 46.0 % 43.2 41.3 39.8  Platelets 150 - 400 K/uL 293 260 250   BMP Latest Ref Rng & Units 07/11/2018 07/10/2018 07/08/2018  Glucose 70 - 99 mg/dL 140(H) 169(H) 167(H)  BUN 8 - 23 mg/dL 20 24(H) 21  Creatinine 0.44 - 1.00 mg/dL  0.93 0.97 0.86  Sodium 135 - 145 mmol/L 143 144 140  Potassium 3.5 - 5.1 mmol/L 4.1 4.4 3.9  Chloride 98 - 111 mmol/L 107 109 107  CO2 22 - 32 mmol/L 25 27 24   Calcium 8.9 - 10.3 mg/dL 8.9 8.7(L) 8.6(L)

## 2018-07-12 NOTE — Care Management Note (Signed)
Case Management Note Original Note by; Zenon Mayo, RN 07/10/2018, 3:52 PM  Patient Details  Name: Alexis Edwards MRN: [id number] Date of Birth: 03/18/1946  Subjective/Objective:   Altered mental status with new onset seizures secondary to Left anterior frontal mass lesion  with extensive edema.                Action/Plan: NCM will follow for transition of care needs.  Expected Discharge Date:                  Expected Discharge Plan:  Home/Self Care  In-House Referral:     Discharge planning Services  CM Consult  Post Acute Care Choice:    Choice offered to:     DME Arranged:    DME Agency:     HH Arranged:    HH Agency:     Status of Service:  In process, will continue to follow  If discussed at Long Length of Stay Meetings, dates discussed:    Additional Comments:  07/12/18 J. Amerson, RN, BSN PTA, pt independent of ADLS; lives with spouse.  PT recommending no OP follow up or DME.    Ella Bodo, RN 07/12/2018, 3:33 PM

## 2018-07-12 NOTE — Progress Notes (Signed)
Occupational Therapy Evaluation Patient Details Name: Alexis Edwards MRN: [id number] DOB: 01/20/1946 Today's Date: 07/12/2018    History of Present Illness 72 y.o. woman presented with seizure,, CT/MRI showed left frontal dural-based mass c/w likely meningioma.  Intubated 1-2 days. x-rays showed a distal fibula fx and possible medial malleolus avulsion (pt in Acm boot). PMH: HTN CHF, COPD   Clinical Impression   Upon entrance to room,  Pt apparently agitated about not being able to leave today. Discussed current POC with pt/nsg to de-escalate pt's anxiety. Pt assessed with the [facility] ([facility]real[facility]ognitive Assessment) and scored a 19/30 (below 26 is considered impaired). Pt demonstrated difficulty with attention, memory and executive level skills. Pt states she feels she gets more easily frustrated. Educated pt on importance of having her husband supervise her medication and Doctor, hospital. Pt verbalized understanding. Pt endorses she has more problems with her memory and her attention "lately". Recommend pt have OT assessment after her upcoming brain surgery to further assess need for outpt OT given apparent cognitive deficits as noted below. Pt verbalized understanding.     Follow Up Recommendations  Supervision - Intermittent(S wtih all medication and financial mangement; refrain from driving )    Equipment Recommendations       Recommendations for Other Services       Precautions / Restrictions Precautions Precaution Comments: fx of R distal fibula Required Braces or Orthoses: Other Brace/Splint Other Brace/Splint: camwalker boot for ambulation Restrictions Weight Bearing Restrictions: Yes RLE Weight Bearing: Weight bearing as tolerated      Mobility Bed Mobility               General bed mobility comments: OOB in chair  Transfers Overall transfer level: Needs assistance Equipment used: Rolling walker (2 wheeled) Transfers: Sit to/from Stand Sit to Stand:  Supervision              Balance Overall balance assessment: No apparent balance deficits (not formally assessed)                                         ADL either performed or assessed with clinical judgement   ADL Overall ADL's : Needs assistance/impaired                                     Functional mobility during ADLs: Supervision/safety;Rolling walker General ADL Comments: Pt uses a toilet wand for hygiene     Vision   Additional Comments: no report of visual changes     Perception     Praxis Praxis Praxis tested?: Within functional limits    Pertinent Vitals/Pain Pain Assessment: No/denies pain     Hand Dominance Right   Extremity/Trunk Assessment Upper Extremity Assessment Upper Extremity Assessment: Overall WFL for tasks assessed   Lower Extremity Assessment Lower Extremity Assessment: Defer to PT evaluation   Cervical / Trunk Assessment Cervical / Trunk Assessment: Normal   Communication Communication Communication: No difficulties   Cognition Arousal/Alertness: Awake/alert Behavior During Therapy: WFL for tasks assessed/performed Overall Cognitive Status: Impaired/Different from baseline Area of Impairment: Attention;Memory                   Current Attention Level: Selective Memory: Decreased short-term memory         General Comments: Scored a 19/30 on the [facility] demonstrting difficulty  with serial subtraction, attention and executive level skills   General Comments       Exercises     Shoulder Instructions      Home Living Family/patient expects to be discharged to:: Private residence Living Arrangements: Spouse/significant other Available Help at Discharge: Family Type of Home: House Home Access: Stairs to enter Technical brewer of Steps: 4 Entrance Stairs-Rails: [place]: One level     Bathroom Shower/Tub: Occupational psychologist: Handicapped  height Bathroom Accessibility: Yes How Accessible: Accessible via walker [place] - single point;Grab bars - tub/shower;Shower seat - built in;Walker - 2 wheels          Prior Functioning/Environment Level of Independence: Independent        Comments: resopnsible for finances adn medication management        OT Problem List: Decreased activity tolerance;Obesity;Decreased cognition      OT Treatment/Interventions:      OT Goals(Current goals can be found in the care plan section) Acute Rehab OT Goals Patient Stated Goal: prepare for upcoming surgery OT Goal Formulation: All assessment and education complete, DC therapy Time For Goal Achievement: 07/12/18  OT Frequency:     Barriers to D/C:            Co-evaluation              AM-PAC PT "6 Clicks" Daily Activity     Outcome Measure Help from another person eating meals?: None Help from another person taking care of personal grooming?: None Help from another person toileting, which includes using toliet, bedpan, or urinal?: A Little Help from another person bathing (including washing, rinsing, drying)?: A Little Help from another person to put on and taking off regular upper body clothing?: None Help from another person to put on and taking off regular lower body clothing?: A Little 6 Click Score: 21   End of Session Equipment Utilized During Treatment: Gait belt;Rolling walker  Activity Tolerance: Patient tolerated treatment well Patient left: in chair;with call bell/phone within reach  OT Visit Diagnosis: Unsteadiness on feet (R26.81);Other symptoms and signs involving cognitive function                Time: 1515-1550 OT Time Calculation (min): 35 min Charges:  OT General Charges $OT Visit: 1 Visit OT Evaluation $OT Eval Moderate Complexity: 1 Mod OT Treatments $Self Care/Home Management : 8-22 mins  Maurie Boettcher, OT/L   Acute OT Clinical Specialist Acute Rehabilitation Services Pager  [phone] Office [phone]   [facility] 07/12/2018, 4:53 PM

## 2018-07-13 LAB — CULTURE, BLOOD (ROUTINE X 2)
Culture: NO GROWTH
Culture: NO GROWTH
Special Requests: ADEQUATE

## 2018-07-13 LAB — CBC WITH DIFFERENTIAL/PLATELET
Abs Immature Granulocytes: 0.3 10*3/uL — ABNORMAL HIGH (ref 0.0–0.1)
Basophils Absolute: 0 10*3/uL (ref 0.0–0.1)
Basophils Relative: 0 %
Eosinophils Absolute: 0.1 10*3/uL (ref 0.0–0.7)
Eosinophils Relative: 0 %
HCT: 41.5 % (ref 36.0–46.0)
Hemoglobin: 14.1 g/dL (ref 12.0–15.0)
Immature Granulocytes: 1 %
Lymphocytes Relative: 5 %
Lymphs Abs: 0.8 10*3/uL (ref 0.7–4.0)
MCH: 34.1 pg — ABNORMAL HIGH (ref 26.0–34.0)
MCHC: 34 g/dL (ref 30.0–36.0)
MCV: 100.2 fL — ABNORMAL HIGH (ref 78.0–100.0)
Monocytes Absolute: 0.6 10*3/uL (ref 0.1–1.0)
Monocytes Relative: 3 %
Neutro Abs: 17.1 10*3/uL — ABNORMAL HIGH (ref 1.7–7.7)
Neutrophils Relative %: 91 %
Platelets: 260 10*3/uL (ref 150–400)
RBC: 4.14 MIL/uL (ref 3.87–5.11)
RDW: 11.9 % (ref 11.5–15.5)
WBC: 18.9 10*3/uL — ABNORMAL HIGH (ref 4.0–10.5)

## 2018-07-13 LAB — GLUCOSE, CAPILLARY
Glucose-Capillary: 133 mg/dL — ABNORMAL HIGH (ref 70–99)
Glucose-Capillary: 129 mg/dL — ABNORMAL HIGH (ref 70–99)
Glucose-Capillary: 155 mg/dL — ABNORMAL HIGH (ref 70–99)

## 2018-07-13 MED ORDER — DEXAMETHASONE 4 MG PO TABS: 4.0000 mg | ORAL_TABLET | Freq: Every day | ORAL | 0 refills | Status: AC

## 2018-07-13 MED ORDER — DEXAMETHASONE 4 MG PO TABS
4.0000 mg | ORAL_TABLET | Freq: Three times a day (TID) | ORAL | Status: DC
  Administered 2018-07-13: 4 mg via ORAL
  Filled 2018-07-13: qty 1

## 2018-07-13 MED ORDER — LEVETIRACETAM 1000 MG PO TABS: 1000.0000 mg | ORAL_TABLET | Freq: Two times a day (BID) | ORAL | 1 refills | Status: DC

## 2018-07-13 NOTE — Discharge Instructions (Signed)
Call Neurosurgical office for follow up appointment upon discharging home , at [phone].

## 2018-07-13 NOTE — Discharge Summary (Signed)
Physician Discharge Summary [facility]    Patient: Alexis Edwards                   Admit date: 07/08/2018   DOB: [birth year]             Discharge date:07/13/2018/12:54 PM UXN:[id number]                           PCP: Leonides Sake, MD  Recommendations for Outpatient Follow-up:   . Follow up: Neurosurgery, orthopedic team, neurologist  Discharge Condition: Stable   Code Status:   Code Status: Full Code  Diet recommendation: Cardiac diet   Discharge Diagnoses:    Active Problems:   OSA (obstructive sleep apnea)   Cerebral edema (HCC)   Brain mass   Endotracheally intubated   Chronic congestive heart failure ([place])   Encounter for orogastric (OG) tube placement   Seizure ([place])   Pressure injury of skin   Acute respiratory failure with hypoxemia ([place])   Acute encephalopathy   History of Present Illness/ Hospital Course Alexis Edwards Summary:     [name] is a 72 yr old female with PMHx significant for CHF, previous Takotsubo episode in 2010, Depression, HLD, HTN, Obesity, Obstructive Sleep Apnea presented to [facility] ED on 07/07/18 with chief complaint of altered mental status. Per ED documentation pt's family reports that around noon she was confused and by 1600 she had her first episode of generalized tonic clonic seizure for 1 min duration. EMS was called and on arrival pt was post ictal followed by moments of confusion mixed with episodes of clarity. The second witnessed episode of seizure occurred at 17:52 in [place] ED and was described as staring to the right with jerking of all extremities and face. At 19:07 pt was noted to be more somnolent with sonorous respirations and difficult to arouse and a decision was made to intubate.  Patient was subsequently stabilized, was successfully extubated,  on IV Decadron, IV Keppra, which was  switched to p.o. Instructed to follow-up with neurosurgery, orthopedic team, PCP and neurologist as an  outpatient. Anticipating reevaluation by neurosurgery in 2 weeks for possible resection. Anticipating orthopedic evaluation in 2 to 3 weeks for possible ORIF.,  Continue, COM boot.   ------------------------------------------------------------------------------------------------------------------------------------------------------ For detailed hospital course please see below:      Altered mental status/new onset seizures/status epilepticus due to left anterior frontal mass lesion 3.3 cm with extensive edema -Status post ICU admission for altered mental status for airway protection intubated subsequently extubated -was transferred out of ICU on 9/18 -Stable on room air now. -Status epilepticus has improved, status post loading with IV Keppra, now on p.o. Keppra 1000 mg p.o. twice daily, -Continue to be on IV Decadron for cerebral edema -proving awake alert,  Neurosurgery recommended Decadron to be switched to p.o. 4 mg daily he will follow-up in office  -Completed EEG, neurology following -EEG per neuro at this point - No surgical interventions until recovered from abdominal surgery -Per patient likely to be evaluated for resection in 2 weeks  Right ankle pain and chronic pain/finding consistent distal fibula fracture -Ortho consulted, following recommended ORIF possibly in 2 weeks.,  Continue orthotics  Bradycardia -Improved -symptomatically we will continue to monitor  History of systolic congestive heart failure -Stable, not complaining of shortness of breath or chest pain Last EF 65-70% EKG QTc 424 PR 170 low voltage QRS -Enzymes negative -May resume home dose of Lasix  Acute hypoxic respiratory failure in the setting of acute encephalopathy - -is off now on O2 via nasal cannula - Was  Intubated on Mechanical ventilation -subsequently was titrated down, extubated on 07/10/2018 -IS per RT protocol -Flutter valve - Titrate O2 for sat of 88-92%, on room air  now   Hyperglycemia  -Likely due to steroids , hemoglobin A1c 5.1, blood sugars improving  -Healthy cardiac diet recommended  History of diverticulosis/diverticulitis -Status post recent colon resection, 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  To resume only Cymbalta for her depression New medication of Keppra 1 g p.o. twice daily, continue dexamethasone 4 mg daily   Consultations:  PCCM, neurosurgery, neurology    Discharge Instructions:   Discharge Instructions    Activity as tolerated - No restrictions   Complete by:  As directed    Diet - low sodium heart healthy   Complete by:  As directed    Discharge instructions   Complete by:  As directed    Follow-up with neurosurgery as scheduled, (your steroids, Decadron may be managed by neurosurgery) Follow with orthopedic team as scheduled May follow-up with your choice of neurologist Your new medication Keppra may be managed by neurosurgery or neurologist of your choice.   Increase activity slowly   Complete by:  As directed        Medication List    STOP taking these medications   doxazosin 4 MG tablet Commonly known as:  CARDURA   DULoxetine 30 MG capsule Commonly known as:  CYMBALTA   methocarbamol 500 MG tablet Commonly known as:  ROBAXIN     TAKE these medications   acetaminophen 500 MG tablet Commonly known as:  TYLENOL Take 1,000 mg by mouth every 6 (six) hours as needed for headache (pain).   aspirin  81 MG chewable tablet Chew 81 mg by mouth daily.   buPROPion 100 MG 12 hr tablet Commonly known as:  WELLBUTRIN SR Take 100 mg by mouth 2 (two) times daily.   dexamethasone 4 MG tablet Commonly known as:  DECADRON Take 1 tablet (4 mg total) by mouth daily.   levETIRAcetam 1000 MG tablet Commonly known as:  KEPPRA Take 1 tablet (1,000 mg total) by mouth 2 (two) times daily.   multivitamin with minerals Tabs tablet Take 1 tablet by mouth daily.   omeprazole 20 MG capsule Commonly known as:  PRILOSEC Take 20 mg by mouth daily.   Oxycodone HCl 10 MG Tabs Take 10 mg by mouth every 6 (six) hours as needed (pain). What changed:  Another medication with the same name was removed. Continue taking this medication, and follow the directions you see here.   solifenacin 10 MG tablet Commonly known as:  VESICARE Take 10 mg by mouth at bedtime.       Allergies  Allergen Reactions  . Macrobid [Nitrofurantoin] Itching and Rash  . Sulfa Antibiotics Itching and Rash     Procedures /Studies:   Dg Ankle 2 Views Right  Result Date: 07/10/2018 CLINICAL DATA:  Fall.  Persistent pain.  Bruising. EXAM: RIGHT ANKLE - 2 VIEW COMPARISON:  No  recent prior. FINDINGS: An oblique slightly displaced acute appearing fracture of the distal right fibula noted. Tiny bony density noted adjacent to the medial malleolus is could represent tiny avulsion fracture. Age is undetermined. Diffuse degenerative change. Diffuse soft tissue swelling. Venous calcification noted. IMPRESSION: Oblique slightly displaced fracture of the distal right fibula. Tiny avulsion fracture of the distal tip of the medial malleolus. Electronically Signed   By: Marcello Moores  Register   On: 07/10/2018 12:31   Mr Jeri Cos Wo Contrast  Result Date: 07/08/2018 CLINICAL DATA:  Brain mass. EXAM: MRI HEAD WITHOUT AND WITH CONTRAST TECHNIQUE: Multiplanar, multiecho pulse sequences of the brain and surrounding structures were obtained without and with  intravenous contrast. CONTRAST:  10 cc Gadavist intravenous COMPARISON:  Head CT from yesterday FINDINGS: Brain: 28 x 32 x 24 mm avidly enhancing mass along the high and anterior left frontal convexity. This is favored to be extra-axial and this is primarily based on apparent claw sign with cortex buckled anterior and posterior to the mass on sagittal T1 weighted imaging. Deep to the mass a cortical rim or CSF cleft is not visualized but this may be related to compression. There is suggestion of a dural tail on postcontrast imaging. The mass has central vessels and is primarily isointense to cortex on T2 weighted imaging. No second mass is seen. Vasogenic edema is with midline shift and subfalcine herniation. No entrapment. No ACA infarct. Vascular: Major flow voids and vascular enhancements are preserved. Skull and upper cervical spine: No evidence of marrow lesion Sinuses/Orbits: Negative IMPRESSION: 1. 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 2. Severe vasogenic edema with subfalcine herniation. Electronically Signed   By: Monte Fantasia M.D.   On: 07/08/2018 10:47   Dg Chest Port 1 View  Result Date: 07/09/2018 CLINICAL DATA:  72 year old female on ventilator. Seizure. Subsequent encounter. EXAM: PORTABLE CHEST 1 VIEW COMPARISON:  07/08/2018. FINDINGS: Endotracheal tube tip 3.8 cm above the carina. Nasogastric tube courses below the diaphragm. Tip is not included on the present exam. Cardiomegaly. Bilateral pleural effusions. Pulmonary vascular congestion. Bibasilar atelectasis. No pneumothorax detected. Post left shoulder replacement. Calcified tortuous aorta. IMPRESSION: 1. Endotracheal tube tip 3.8 cm above the carina. 2. Cardiomegaly, pulmonary vascular congestion and small bilateral pleural effusions without significant change. 3. Basilar subsegmental atelectasis. 4.  Aortic Atherosclerosis (ICD10-I70.0).  Calcified tortuous aorta. Electronically Signed   By: Genia Del M.D.   On: 07/09/2018 07:22   Dg Chest Port 1 View  Result Date: 07/08/2018 CLINICAL DATA:  Life support line placement. EXAM: PORTABLE CHEST 1 VIEW COMPARISON:  Chest radiograph July 07, 2018 at 1946 hours. FINDINGS: Endotracheal tube tip projects 18 mm above the carina. Nasogastric tube past GE junction, distal tip out of field of view. Mild cardiomegaly. Pulmonary vascular congestion and interstitial prominence. Low inspiratory examination with bibasilar strandy densities. Small suspected LEFT pleural effusion. No pneumothorax. LEFT shoulder arthroplasty. IMPRESSION: Endotracheal tube tip projects 1.8 cm above the carina. Mild cardiomegaly with mild interstitial prominence, potentially related to low inspiratory examination. Bibasilar atelectasis and small suspected LEFT pleural effusion. Electronically Signed   By: Elon Alas M.D.   On: 07/08/2018 00:53   Dg Abd Portable 1v  Result Date: 07/08/2018 CLINICAL DATA:  Orogastric tube placement. EXAM: PORTABLE ABDOMEN - 1 VIEW COMPARISON:  Abdominal radiograph June 29, 2018 FINDINGS: Nasogastric tube tip projects in mid stomach. Partially imaged LEFT nephroureteral stent. Surgical clips in the included right abdomen compatible with cholecystectomy. Included bowel gas  pattern is nondilated and nonobstructive. Linear lucency LEFT mid abdomen. IMPRESSION: 1. Nasogastric tube tip projecting in mid abdomen. LEFT nephroureteral stent. 2. Linear lucency LEFT abdomen, potentially external to the patient though, subcutaneous gas not excluded. 3. Nonspecific bowel gas pattern. Electronically Signed   By: Elon Alas M.D.   On: 07/08/2018 00:54     Subjective:   Patient was seen and examined 07/13/2018, 12:54 PM Patient stable today. No acute distress.  No issues overnight Stable for discharge.  Discharge Exam:    Vitals:   07/13/18 0827 07/13/18 0830 07/13/18 0929 07/13/18 1223  BP: (!) 113/47 (!) 113/47 140/85 (!) 149/56    Pulse: (!) 59  61 (!) 55  Resp: 18 16 19 20   Temp: 97.8 F (36.6 C)   98 F (36.7 C)  TempSrc: Oral   Oral  SpO2: 91%  93% 98%  Weight:      Height:        General: Pt lying comfortably in bed & appears in no obvious distress. Cardiovascular: S1 & S2 heard, RRR, S1/S2 +. No murmurs, rubs, gallops or clicks. No JVD or pedal edema. Respiratory: Clear to auscultation without wheezing, rhonchi or crackles. No increased work of breathing. Abdominal:  Non-distended, non-tender & soft. No organomegaly or masses appreciated. Normal bowel sounds heard. CNS: Alert and oriented. No focal deficits. Extremities: no edema, no cyanosis    The results of significant diagnostics from this hospitalization (including imaging, microbiology, ancillary and laboratory) are listed below for reference.      Microbiology:   Recent Results (from the past 240 hour(s))  MRSA PCR Screening     Status: None   Collection Time: 07/08/18 12:33 AM  Result Value Ref Range Status   MRSA by PCR NEGATIVE NEGATIVE Final    Comment:        The GeneXpert MRSA Assay (FDA approved for NASAL specimens only), is one component of a comprehensive MRSA colonization surveillance program. It is not intended to diagnose MRSA infection nor to guide or monitor treatment for MRSA infections. Performed at [facility], [place] [address], [place], [place] [address]   Culture, Urine     Status: Abnormal   Collection Time: 07/08/18 12:44 AM  Result Value Ref Range Status   Specimen Description URINE, RANDOM  Final   Special Requests   Final    NONE Performed at [facility], [place] [address], [place], [place] [address]    Culture MULTIPLE SPECIES PRESENT, SUGGEST RECOLLECTION (A)  Final   Report Status 07/09/2018 FINAL  Final  Culture, respiratory (non-expectorated)     Status: None   Collection Time: 07/08/18  1:08 AM  Result Value Ref Range Status   Specimen Description TRACHEAL ASPIRATE  Final   Special  Requests NONE  Final   Gram Stain NO WBC SEEN RARE GRAM POSITIVE COCCI   Final   Culture   Final    MODERATE Consistent with normal respiratory flora. Performed at [facility], [place] [address] [place], [place] [address]    Report Status 07/10/2018 FINAL  Final  Culture, blood (routine x 2)     Status: None   Collection Time: 07/08/18  1:35 AM  Result Value Ref Range Status   Specimen Description BLOOD LEFT ARM  Final   Special Requests   Final    BOTTLES DRAWN AEROBIC AND ANAEROBIC Blood Culture results may not be optimal due to an excessive volume of blood received in culture bottles  Culture   Final    NO GROWTH 5 DAYS Performed at [facility], [place] [address], [place], [place] [address]    Report Status 07/13/2018 FINAL  Final  Culture, blood (routine x 2)     Status: None   Collection Time: 07/08/18  1:40 AM  Result Value Ref Range Status   Specimen Description BLOOD LEFT HAND  Final   Special Requests   Final    BOTTLES DRAWN AEROBIC ONLY Blood Culture adequate volume   Culture   Final    NO GROWTH 5 DAYS Performed at [facility], [place] [address] [place], [place] [address]    Report Status 07/13/2018 FINAL  Final     Labs:   CBC: Recent Labs  Lab 07/10/18 0416 07/11/18 0345 07/12/18 0254 07/12/18 1116 07/13/18 0817  WBC 14.1* 12.3* 17.1* 17.8* 18.9*  NEUTROABS 12.4* 10.9* 15.2* 16.1* 17.1*  HGB 13.3 13.9 14.4 13.9 14.1  HCT 39.8 41.3 43.2 41.9 41.5  MCV 100.5* 99.0 100.0 100.5* 100.2*  PLT 250 260 293 275 528   Basic Metabolic Panel: Recent Labs  Lab 07/08/18 0138 07/08/18 2318 07/09/18 0209 07/10/18 0416 07/11/18 0345 07/12/18 0254  NA 139 140  --  144 143  --   K 4.2 3.9  --  4.4 4.1  --   CL 103 107  --  109 107  --   CO2 24 24  --  27 25  --   GLUCOSE 169* 167*  --  169* 140*  --   BUN 12 21  --  24* 20  --   CREATININE 1.05* 0.86  --  0.97 0.93  --   CALCIUM 8.9 8.6*  --  8.7* 8.9  --   MG 1.8  --  2.1 2.0 2.1 1.9    PHOS  --   --  3.2 2.9 3.1 2.8   Liver Function Tests: Recent Labs  Lab 07/08/18 0138 07/09/18 0209  AST 21 15  ALT 18 17  ALKPHOS 51 48  BILITOT 1.3* 1.0  PROT 6.3* 6.3*  ALBUMIN 3.4* 3.1*   BNP (last 3 results) No results for input(s): BNP in the last 8760 hours. Cardiac Enzymes: Recent Labs  Lab 07/09/18 0209  TROPONINI <0.03   CBG: Recent Labs  Lab 07/12/18 1950 07/12/18 2323 07/13/18 0337 07/13/18 0751 07/13/18 1227  GLUCAP 209* 181* 133* 129* 155*   Urinalysis    Component Value Date/Time   COLORURINE AMBER (A) 03/29/2018 2348   APPEARANCEUR CLOUDY (A) 03/29/2018 2348   LABSPEC 1.025 03/29/2018 2348   PHURINE 5.0 03/29/2018 2348   GLUCOSEU NEGATIVE 03/29/2018 2348   HGBUR LARGE (A) 03/29/2018 2348   BILIRUBINUR NEGATIVE 03/29/2018 2348   KETONESUR 5 (A) 03/29/2018 2348   PROTEINUR 30 (A) 03/29/2018 2348   UROBILINOGEN 0.2 02/25/2011 0955   NITRITE NEGATIVE 03/29/2018 2348   LEUKOCYTESUR LARGE (A) 03/29/2018 2348    Time coordinating discharge: 45 minutes  SIGNED: Deatra James, MD, FACP, FHM. [facility],  Pager [phone]  If 7PM-7AM, please contact night-coverage [email or web address] Ota [facility] 07/13/2018, 12:54 PM

## 2018-07-13 NOTE — Progress Notes (Signed)
OOB MOD ASSIST, AMBULATED TO BR PER WALKER WBAT WITH CAM IN PLACE. TOLERATED WELL. UP IN CHAIR. CALL BELL IN REACH. FALL PRECAUTIONS OBSERVED.

## 2018-07-26 DIAGNOSIS — R569 Unspecified convulsions: Secondary | ICD-10-CM | POA: Diagnosis not present

## 2018-07-26 DIAGNOSIS — D320 Benign neoplasm of cerebral meninges: Secondary | ICD-10-CM | POA: Diagnosis not present

## 2018-07-26 DIAGNOSIS — Z23 Encounter for immunization: Secondary | ICD-10-CM | POA: Diagnosis not present

## 2018-07-26 DIAGNOSIS — S82831A Other fracture of upper and lower end of right fibula, initial encounter for closed fracture: Secondary | ICD-10-CM | POA: Diagnosis not present

## 2018-08-02 DIAGNOSIS — D496 Neoplasm of unspecified behavior of brain: Secondary | ICD-10-CM | POA: Diagnosis not present

## 2018-08-10 DIAGNOSIS — N200 Calculus of kidney: Secondary | ICD-10-CM | POA: Diagnosis not present

## 2018-08-10 DIAGNOSIS — N390 Urinary tract infection, site not specified: Secondary | ICD-10-CM | POA: Diagnosis not present

## 2018-08-24 DIAGNOSIS — N200 Calculus of kidney: Secondary | ICD-10-CM | POA: Diagnosis not present

## 2018-08-24 DIAGNOSIS — K5900 Constipation, unspecified: Secondary | ICD-10-CM | POA: Diagnosis not present

## 2018-08-28 ENCOUNTER — Encounter: Payer: Self-pay | Admitting: Neurology

## 2018-08-28 ENCOUNTER — Ambulatory Visit: Payer: Self-pay | Admitting: Neurology

## 2018-08-28 ENCOUNTER — Telehealth: Payer: Self-pay | Admitting: Neurology

## 2018-08-28 DIAGNOSIS — S82831S Other fracture of upper and lower end of right fibula, sequela: Secondary | ICD-10-CM | POA: Diagnosis not present

## 2018-08-28 DIAGNOSIS — D320 Benign neoplasm of cerebral meninges: Secondary | ICD-10-CM | POA: Diagnosis not present

## 2018-08-28 DIAGNOSIS — N200 Calculus of kidney: Secondary | ICD-10-CM | POA: Diagnosis not present

## 2018-08-28 DIAGNOSIS — R569 Unspecified convulsions: Secondary | ICD-10-CM | POA: Diagnosis not present

## 2018-08-28 NOTE — Telephone Encounter (Signed)
This patient did not show for a new patient appointment today. 

## 2018-08-29 DIAGNOSIS — S8264XA Nondisplaced fracture of lateral malleolus of right fibula, initial encounter for closed fracture: Secondary | ICD-10-CM | POA: Diagnosis not present

## 2018-09-05 DIAGNOSIS — N200 Calculus of kidney: Secondary | ICD-10-CM | POA: Diagnosis not present

## 2018-09-05 DIAGNOSIS — R1030 Lower abdominal pain, unspecified: Secondary | ICD-10-CM | POA: Diagnosis not present

## 2018-09-10 DIAGNOSIS — R1032 Left lower quadrant pain: Secondary | ICD-10-CM | POA: Diagnosis not present

## 2018-09-10 DIAGNOSIS — N200 Calculus of kidney: Secondary | ICD-10-CM | POA: Diagnosis not present

## 2018-09-21 DIAGNOSIS — N200 Calculus of kidney: Secondary | ICD-10-CM | POA: Diagnosis not present

## 2018-09-26 DIAGNOSIS — E2839 Other primary ovarian failure: Secondary | ICD-10-CM | POA: Diagnosis not present

## 2018-10-01 DIAGNOSIS — N200 Calculus of kidney: Secondary | ICD-10-CM | POA: Diagnosis not present

## 2018-10-01 DIAGNOSIS — M47816 Spondylosis without myelopathy or radiculopathy, lumbar region: Secondary | ICD-10-CM | POA: Diagnosis not present

## 2018-10-25 DIAGNOSIS — C672 Malignant neoplasm of lateral wall of bladder: Secondary | ICD-10-CM | POA: Diagnosis not present

## 2018-10-25 DIAGNOSIS — N200 Calculus of kidney: Secondary | ICD-10-CM | POA: Diagnosis not present

## 2018-11-14 ENCOUNTER — Ambulatory Visit (INDEPENDENT_AMBULATORY_CARE_PROVIDER_SITE_OTHER): Payer: Medicare Other | Admitting: Neurology

## 2018-11-14 ENCOUNTER — Encounter: Payer: Self-pay | Admitting: Neurology

## 2018-11-14 VITALS — BP 126/76 | HR 68 | Ht 62.0 in | Wt 242.0 lb

## 2018-11-14 DIAGNOSIS — R569 Unspecified convulsions: Secondary | ICD-10-CM

## 2018-11-14 DIAGNOSIS — D329 Benign neoplasm of meninges, unspecified: Secondary | ICD-10-CM

## 2018-11-14 HISTORY — DX: Benign neoplasm of meninges, unspecified: D32.9

## 2018-11-14 MED ORDER — LEVETIRACETAM 1000 MG PO TABS: 1000.0000 mg | ORAL_TABLET | Freq: Two times a day (BID) | ORAL | 3 refills | Status: DC

## 2018-11-14 NOTE — Progress Notes (Signed)
Reason for visit: Seizures, left frontal mass  Referring physician: [facility]  Alexis Edwards is a 73 y.o. female  History of present illness:  Alexis Edwards is a 73 year old right-handed white female who presented to [facility] on 26 June 2018.  The patient had onset of seizures, she had a seizure before going to the hospital and at least 1 or 2 at the hospital associated with turning the head to the right and generalized jerking.  The patient had a scan of the brain that showed a left frontal mass that likely represents a meningioma with severe left hemispheric edema.  The patient was treated with steroids and was placed on Keppra, working up to a 1000 mg twice daily dose.  Fortunately, the patient has not had any recurrence of her seizures.  She ran out of her Keppra 3 days ago, she comes to this office for an evaluation.  According to her husband, the patient has had some problems with memory and concentration over the 6 to 12 months prior to the September hospitalization.  She has had a 2 or 3-year history of headaches.  The patient denies any weakness or numbness of the extremities but she feels weak all over.  She has had difficulty reading, possibly with some blurring of vision.  The patient denies issues controlling the bowels or the bladder, she does have some slight dizziness.  She has difficulty walking mainly because of degenerative arthritis affecting the right knee.  She is sent to this office for an evaluation.  She had been on Wellbutrin, this was discontinued.  Past Medical History:  Diagnosis Date  . Arthritis   . CHF (congestive heart failure) ([place])   . COPD (chronic obstructive pulmonary disease) ([place])   . Depression   . Dyslipidemia   . GERD (gastroesophageal reflux disease)   . Heart murmur   . History of kidney stones   . HTN (hypertension)   . Hyperlipidemia   . Obesity    BMI 55  . Pneumonia    hx  . Sleep apnea    does not use cpap  .  Sleep apnea   . Takotsubo syndrome April 2013   apical ballooning, [facility] [place]    Past Surgical History:  Procedure Laterality Date  . ABDOMINAL HYSTERECTOMY    . CARDIAC CATHETERIZATION    . CHOLECYSTECTOMY  2/10  . REVERSE SHOULDER ARTHROPLASTY Left 02/24/2017   Procedure: REVERSE LEFT SHOULDER ARTHROPLASTY;  Surgeon: Netta Cedars, MD;  Location: [facility];  Service: Orthopedics;  Laterality: Left;  . TOTAL KNEE ARTHROPLASTY  5/12   Lt    Family History  Problem Relation Age of Onset  . Coronary artery disease Brother 50       stent  . Diabetes Mother   . Hypertension Mother   . Cancer Mother   . Coronary artery disease Sister   . Diabetes Sister   . Cancer Sister     Social history:  reports that she quit smoking about 32 years ago. Her smoking use included cigarettes. She has a 20.00 pack-year smoking history. She has never used smokeless tobacco. She reports current alcohol use. She reports that she does not use drugs.  Medications:  Prior to Admission medications   Medication Sig Start Date End Date Taking? Authorizing Provider  acetaminophen (TYLENOL) 500 MG tablet Take 1,000 mg by mouth every 6 (six) hours as needed for headache (pain).   Yes [provider]  aspirin 81 MG  chewable tablet Chew 81 mg by mouth daily.    Yes [provider]  buPROPion (WELLBUTRIN SR) 100 MG 12 hr tablet Take 100 mg by mouth 2 (two) times daily.   Yes [provider]  Cholecalciferol (VITAMIN D3 PO) Take by mouth.   Yes [provider]  Multiple Vitamin (MULTIVITAMIN WITH MINERALS) TABS tablet Take 1 tablet by mouth daily.   Yes [provider]  omeprazole (PRILOSEC) 20 MG capsule Take 20 mg by mouth daily.   Yes [provider]  Oxycodone HCl 10 MG TABS Take 10 mg by mouth every 6 (six) hours as needed (pain).    Yes [provider]  solifenacin (VESICARE) 10 MG tablet Take 10 mg by mouth at bedtime.    Yes [provider]  levETIRAcetam (KEPPRA) 1000 MG tablet Take 1 tablet (1,000 mg total) by mouth 2 (two) times daily. 07/13/18 08/12/18  Deatra James, MD      Allergies  Allergen Reactions  . Macrobid [Nitrofurantoin] Itching and Rash  . Sulfa Antibiotics Itching and Rash    ROS:  Out of a complete 14 system review of symptoms, the patient complains only of the following symptoms, and all other reviewed systems are negative.  Ringing in the ears Blur red vision Snoring Memory loss, headache Depression, anxiety, decreased appetite, disinterest in activities Restless legs  Blood pressure 126/76, pulse 68, height 5\' 2"  (1.575 m), weight 242 lb (109.8 kg), SpO2 93 %.  Physical Exam  General: The patient is alert and cooperative at the time of the examination.  The patient is markedly obese.  Eyes: Pupils are equal, round, and reactive to light. Discs are flat bilaterally.  Neck: The neck is supple, no carotid bruits are noted.  Respiratory: The respiratory examination is clear.  Cardiovascular: The cardiovascular examination reveals a regular rate and rhythm, no obvious murmurs or rubs are noted.  Skin: Extremities are without significant edema.  Neurologic Exam  Mental status: The patient is alert and oriented x 3 at the time of the examination. The patient has apparent normal recent and remote memory, with an apparently normal attention span and concentration ability.  Cranial nerves: Facial symmetry is present. There is good sensation of the face to pinprick and soft touch bilaterally. The strength of the facial muscles and the muscles to head turning and shoulder shrug are normal bilaterally. Speech is well enunciated, no aphasia or dysarthria is noted. Extraocular movements are full. Visual fields are full. The tongue is midline, and the patient has symmetric elevation of the soft palate. No obvious hearing deficits are noted.  Motor: The motor testing reveals 5 over 5  strength of all 4 extremities. Good symmetric motor tone is noted throughout.  Sensory: Sensory testing is intact to pinprick, soft touch, vibration sensation, and position sense on all 4 extremities. No evidence of extinction is noted.  Coordination: Cerebellar testing reveals good finger-nose-finger and heel-to-shin bilaterally.  Gait and station: Gait is associated with a limping type gait on the right leg.  Tandem gait was not attempted.  Romberg is negative.  Reflexes: Deep tendon reflexes are symmetric and normal bilaterally. Toes are downgoing bilaterally.   MRI brain 07/08/18:  IMPRESSION: 1. 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 2. Severe vasogenic edema with subfalcine herniation.  * MRI scan images were reviewed online. I agree with the written report.    Assessment/Plan:  1.  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  Seizures  The patient will need to go back on her [place] soon as possible.  A prescription was sent in for her.  I will send her back to Dr. Zada Finders for a neurosurgical evaluation.  The patient will follow-up here in about 3 to 4 months.   Jill Alexanders MD 11/14/2018 2:06 PM  [facility] [address] [place], [place] [address]  Phone [phone] Fax [phone]

## 2018-11-23 DIAGNOSIS — R030 Elevated blood-pressure reading, without diagnosis of hypertension: Secondary | ICD-10-CM | POA: Diagnosis not present

## 2018-11-23 DIAGNOSIS — Z6841 Body Mass Index (BMI) 40.0 and over, adult: Secondary | ICD-10-CM | POA: Diagnosis not present

## 2018-11-30 ENCOUNTER — Other Ambulatory Visit: Payer: Self-pay | Admitting: Neurological Surgery

## 2018-11-30 ENCOUNTER — Other Ambulatory Visit (HOSPITAL_COMMUNITY): Payer: Self-pay | Admitting: Neurological Surgery

## 2018-11-30 DIAGNOSIS — D496 Neoplasm of unspecified behavior of brain: Secondary | ICD-10-CM

## 2018-12-03 ENCOUNTER — Other Ambulatory Visit: Payer: Self-pay | Admitting: Neurological Surgery

## 2018-12-04 NOTE — Pre-Procedure Instructions (Addendum)
PERSIA LINTNER  12/04/2018     Your procedure is scheduled on Wednesday, February 19..  Report to [facility]  at 10:30 AM               Your surgery or procedure is scheduled for 12:30 PM   Call this number if you have problems the morning of surgery: [phone]  This is the number for the Pre- Surgical Desk.                For any other questions, please call [phone], Monday - Friday 8 AM - 4 PM.     Remember:  Do not eat or drink after midnight Tuesday, February 18.   Take these medicines the morning of surgery with A SIP OF WATER:  DULoxetine (CYMBALTA)             levETIRAcetam (KEPPRA)              omeprazole (PRILOSEC)   May take :Oxycodone, Tylenol  if needed.  1 Week prior to surgery STOP taking Aspirin Products (Goody Powder, Excedrin Migraine), Ibuprofen (Advil), Naproxen (Aleve), Vitamins and Herbal Products (ie Fish Oil). Follow your surgeons instructions regarding Aspirin. Special instructions:  [place]- Preparing For Surgery  Before surgery, you can play an important role. Because skin is not sterile, your skin needs to be as free of germs as possible. You can reduce the number of germs on your skin by washing with CHG (chlorahexidine gluconate) Soap before surgery.  CHG is an antiseptic cleaner which kills germs and bonds with the skin to continue killing germs even after washing.    Oral Hygiene is also important to reduce your risk of infection.  Remember - BRUSH YOUR TEETH THE MORNING OF SURGERY WITH YOUR REGULAR TOOTHPASTE  Please do not use if you have an allergy to CHG or antibacterial soaps. If your skin becomes reddened/irritated stop using the CHG.  Do not shave (including legs and underarms) for at least 48 hours prior to first CHG shower. It is OK to shave your face.  Please follow these instructions carefully.   1. Shower the NIGHT BEFORE SURGERY and the MORNING OF SURGERY with CHG.   2. If you chose to wash your hair, wash  your hair first as usual with your normal shampoo.  3. After you shampoo, wash your face and private area with the soap you use at home, then rinse your hair and body thoroughly to remove the shampoo and soap.  4. Use CHG as you would any other liquid soap. You can apply CHG directly to the skin and wash gently with a scrungie or a clean washcloth.   5. Apply the CHG Soap to your body ONLY FROM THE NECK DOWN.  Do not use on open wounds or open sores. Avoid contact with your eyes, ears, mouth and genitals (private parts).   6. Wash thoroughly, paying special attention to the area where your surgery will be performed.  7. Thoroughly rinse your body with warm water from the neck down.  8. DO NOT shower/wash with your normal soap after using and rinsing off the CHG Soap.  9. Pat yourself dry with a CLEAN TOWEL.  10. Wear CLEAN PAJAMAS to bed the night before surgery, wear comfortable clothes the morning of surgery  Place CLEAN SHEETS on your bed the night of your first shower and DO NOT SLEEP WITH PETs.  Day of Surgery:  Shower as Written Above  Do not wear lotions, powders, or perfumes, or deodorant. Please wear clean clothes to the hospital/surgery center.   Remember to brush your teeth WITH YOUR REGULAR TOOTHPASTE.  Do not wear jewelry, make-up or nail polish.  Do not shave 48 hours prior to surgery.  Men may shave face and neck.  Do not bring valuables to the hospital.  [facility] is not responsible for any belongings or valuables.  Contacts, dentures or bridgework may not be worn into surgery.  Leave your suitcase in the car.  After surgery it may be brought to your room.  For patients admitted to the hospital, discharge time will be determined by your treatment team.  Patients discharged the day of surgery will not be allowed to drive home.   Please read over the following fact sheets that you were given:  Managing Pain, Surgical Site Infection, Coughing and Deep  Breathing

## 2018-12-06 ENCOUNTER — Inpatient Hospital Stay (HOSPITAL_COMMUNITY)
Admission: RE | Admit: 2018-12-06 | Discharge: 2018-12-06 | Disposition: A | Discharge status: Home / Self Care | Payer: Medicare Other | Source: Ambulatory Visit

## 2018-12-06 NOTE — Progress Notes (Signed)
PCP -  Cardiologist -   Chest x-ray -   EKG -   Stress Test -   ECHO -   Cardiac Cath -   AICD- PM- LOOP-  Sleep Study -  CPAP -   LABS-  ASA-  HA1C- Fasting Blood Sugar -  Checks Blood Sugar _____ times a day  Anesthesia-  Pt denies having chest pain, sob, or fever at this time. All instructions explained to the pt, with a verbal understanding of the material. Pt agrees to go over the instructions while at home for a better understanding. The opportunity to ask questions was provided.

## 2018-12-06 NOTE — Progress Notes (Signed)
Alexis Edwards did not show up for her PAT appoint scheduled for 11:00 am.  I called patient's cell phone at 1112 and left a voice message. At 1116 , I called patient's cell phone and home phone and left a voice  message regarding PAT appointment. At 1405, patient called me and said she thought the appointment was on Friday. Patient was give a 1000 appointment with arrival of 0945 on Tuesday, February 18.  I gave patient instructions on address, Entrance A, valet parking and how to get to admitting. Patient repeated time of appointment.

## 2018-12-08 ENCOUNTER — Other Ambulatory Visit (HOSPITAL_COMMUNITY)
Admission: RE | Admit: 2018-12-08 | Discharge: 2018-12-08 | Disposition: A | Discharge status: Home / Self Care | Payer: Medicare Other | Attending: Neurological Surgery | Admitting: Neurological Surgery

## 2018-12-08 DIAGNOSIS — D496 Neoplasm of unspecified behavior of brain: Secondary | ICD-10-CM | POA: Insufficient documentation

## 2018-12-08 LAB — BUN: BUN: 14 mg/dL (ref 8–23)

## 2018-12-08 LAB — CREATININE, SERUM
Creatinine, Ser: 0.97 mg/dL (ref 0.44–1.00)
GFR calc Af Amer: >60 mL/min (ref >60)
GFR calc non Af Amer: 58 mL/min — ABNORMAL LOW (ref >60)

## 2018-12-09 ENCOUNTER — Encounter (HOSPITAL_COMMUNITY): Payer: Self-pay

## 2018-12-09 ENCOUNTER — Ambulatory Visit (HOSPITAL_COMMUNITY): Payer: Medicare Other

## 2018-12-09 ENCOUNTER — Ambulatory Visit (HOSPITAL_COMMUNITY)
Admission: RE | Admit: 2018-12-09 | Discharge: 2018-12-09 | Disposition: A | Discharge status: Home / Self Care | Payer: Medicare Other | Source: Ambulatory Visit | Attending: Neurological Surgery | Admitting: Neurological Surgery

## 2018-12-09 DIAGNOSIS — D496 Neoplasm of unspecified behavior of brain: Secondary | ICD-10-CM

## 2018-12-09 MED ORDER — GADOBUTROL 1 MMOL/ML IV SOLN
10.0000 mL | Freq: Once | INTRAVENOUS | Status: AC | PRN
  Administered 2018-12-09: 10 mL via INTRAVENOUS

## 2018-12-11 ENCOUNTER — Encounter (HOSPITAL_COMMUNITY): Payer: Self-pay

## 2018-12-11 ENCOUNTER — Encounter (HOSPITAL_COMMUNITY)
Admission: RE | Admit: 2018-12-11 | Discharge: 2018-12-11 | Disposition: A | Discharge status: Home / Self Care | Payer: Medicare Other | Source: Ambulatory Visit | Attending: Neurological Surgery | Admitting: Neurological Surgery

## 2018-12-11 DIAGNOSIS — I509 Heart failure, unspecified: Secondary | ICD-10-CM | POA: Diagnosis not present

## 2018-12-11 DIAGNOSIS — G4730 Sleep apnea, unspecified: Secondary | ICD-10-CM | POA: Diagnosis not present

## 2018-12-11 DIAGNOSIS — K219 Gastro-esophageal reflux disease without esophagitis: Secondary | ICD-10-CM | POA: Diagnosis not present

## 2018-12-11 DIAGNOSIS — Z8249 Family history of ischemic heart disease and other diseases of the circulatory system: Secondary | ICD-10-CM | POA: Diagnosis not present

## 2018-12-11 DIAGNOSIS — Z01818 Encounter for other preprocedural examination: Secondary | ICD-10-CM | POA: Insufficient documentation

## 2018-12-11 DIAGNOSIS — I10 Essential (primary) hypertension: Secondary | ICD-10-CM | POA: Insufficient documentation

## 2018-12-11 DIAGNOSIS — F329 Major depressive disorder, single episode, unspecified: Secondary | ICD-10-CM | POA: Diagnosis not present

## 2018-12-11 DIAGNOSIS — Z87442 Personal history of urinary calculi: Secondary | ICD-10-CM | POA: Diagnosis not present

## 2018-12-11 DIAGNOSIS — E785 Hyperlipidemia, unspecified: Secondary | ICD-10-CM | POA: Diagnosis not present

## 2018-12-11 DIAGNOSIS — I110 Hypertensive heart disease with heart failure: Secondary | ICD-10-CM | POA: Diagnosis not present

## 2018-12-11 DIAGNOSIS — D320 Benign neoplasm of cerebral meninges: Secondary | ICD-10-CM | POA: Diagnosis not present

## 2018-12-11 DIAGNOSIS — G40901 Epilepsy, unspecified, not intractable, with status epilepticus: Secondary | ICD-10-CM | POA: Diagnosis not present

## 2018-12-11 DIAGNOSIS — Z6841 Body Mass Index (BMI) 40.0 and over, adult: Secondary | ICD-10-CM | POA: Diagnosis not present

## 2018-12-11 DIAGNOSIS — J449 Chronic obstructive pulmonary disease, unspecified: Secondary | ICD-10-CM | POA: Diagnosis not present

## 2018-12-11 HISTORY — DX: Claustrophobia: F40.240

## 2018-12-11 HISTORY — DX: Dyspnea, unspecified: R06.00

## 2018-12-11 LAB — BASIC METABOLIC PANEL
Anion gap: 9 (ref 5–15)
BUN: 17 mg/dL (ref 8–23)
CO2: 25 mmol/L (ref 22–32)
Calcium: 9.3 mg/dL (ref 8.9–10.3)
Chloride: 109 mmol/L (ref 98–111)
Creatinine, Ser: 1.01 mg/dL — ABNORMAL HIGH (ref 0.44–1.00)
GFR calc Af Amer: >60 mL/min (ref >60)
GFR calc non Af Amer: 55 mL/min — ABNORMAL LOW (ref >60)
Glucose, Bld: 120 mg/dL — ABNORMAL HIGH (ref 70–99)
Potassium: 3.8 mmol/L (ref 3.5–5.1)
Sodium: 143 mmol/L (ref 135–145)

## 2018-12-11 LAB — CBC
HCT: 49 % — ABNORMAL HIGH (ref 36.0–46.0)
Hemoglobin: 16 g/dL — ABNORMAL HIGH (ref 12.0–15.0)
MCH: 31.9 pg (ref 26.0–34.0)
MCHC: 32.7 g/dL (ref 30.0–36.0)
MCV: 97.8 fL (ref 80.0–100.0)
Platelets: 212 10*3/uL (ref 150–400)
RBC: 5.01 MIL/uL (ref 3.87–5.11)
RDW: 11.6 % (ref 11.5–15.5)
WBC: 8.2 10*3/uL (ref 4.0–10.5)
nRBC: 0 % (ref 0.0–0.2)

## 2018-12-11 LAB — ABO/RH: ABO/RH(D): O POS

## 2018-12-11 MED ORDER — CHLORHEXIDINE GLUCONATE CLOTH 2 % EX PADS: 6.0000 | MEDICATED_PAD | Freq: Once | CUTANEOUS | Status: DC

## 2018-12-11 NOTE — Progress Notes (Signed)
PCP - Doc left..but goes to [employer] med Cardiologist - none  Chest x-ray - 9/19 EKG - 12/11/18 Stress Test -2012  ECHO - 2018 Cardiac Cath - none  Sleep Study - yesCPAP - none  Fasting Blood Sugar - na Checks Blood Sugar _____ times a day  Blood Thinner Instructions:na Aspirin Instructions:  Anesthesia review: heart hx  Patient denies shortness of breath, fever, cough and chest pain at PAT appointment   Patient verbalized understanding of instructions that were given to them at the PAT appointment. Patient was also instructed that they will need to review over the PAT instructions again at home before surgery.

## 2018-12-11 NOTE — Anesthesia Preprocedure Evaluation (Addendum)
Anesthesia Evaluation  Patient identified by MRN, date of birth, ID band Patient awake    Reviewed: Allergy & Precautions, NPO status , Patient's Chart, lab work & pertinent test results  Airway Mallampati: II  TM Distance: >3 FB Neck ROM: Full    Dental  (+) Upper Dentures, Lower Dentures   Pulmonary sleep apnea , COPD, former smoker,    Pulmonary exam normal breath sounds clear to auscultation       Cardiovascular hypertension, +CHF  Normal cardiovascular exam+ Valvular Problems/Murmurs  Rhythm:Regular Rate:Normal  ECG: NSR, rate 64  ECHO: LV EF: 65% -   70%  Takotsubo syndrome     Neuro/Psych Seizures -, Poorly Controlled,  PSYCHIATRIC DISORDERS Anxiety Depression    GI/Hepatic Neg liver ROS, GERD  Medicated and Controlled,  Endo/Other  Morbid obesity  Renal/GU negative Renal ROS     Musculoskeletal negative musculoskeletal ROS (+)   Abdominal (+) + obese,   Peds  Hematology negative hematology ROS (+)   Anesthesia Other Findings Brain tumor  Reproductive/Obstetrics                           Anesthesia Physical Anesthesia Plan  ASA: III  Anesthesia Plan: General   Post-op Pain Management:    Induction: Intravenous  PONV Risk Score and Plan: 3 and Ondansetron, Dexamethasone and Treatment may vary due to age or medical condition  Airway Management Planned: Oral ETT  Additional Equipment: Arterial line  Intra-op Plan:   Post-operative Plan: Extubation in OR  Informed Consent: I have reviewed the patients History and Physical, chart, labs and discussed the procedure including the risks, benefits and alternatives for the proposed anesthesia with the patient or authorized representative who has indicated his/her understanding and acceptance.     Dental advisory given  Plan Discussed with: CRNA  Anesthesia Plan Comments: (Reviewed PAT note written 12/11/2018 by Myra Gianotti, PA-C. )       Anesthesia Quick Evaluation

## 2018-12-11 NOTE — Progress Notes (Signed)
Anesthesia Chart Review: 12/06/18 PAT visit rescheduled to 12/11/18 per patient.  Case:  [id number] Date/Time:  12/12/18 1215   Procedures:      Left Craniotomy for tumor resection with brainlab (Left ) - Left Craniotomy for tumor resection with brainlab     APPLICATION OF CRANIAL NAVIGATION (N/A )   Anesthesia type:  General   Pre-op diagnosis:  Brain tumor   Location:  [facility] OR ROOM 21 / [place] OR   Surgeon:  Judith Part, MD      DISCUSSION: Patient is a 73 year old female scheduled for the above procedure.  History includes CHF, Takotsubo syndrome (02/02/12, 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 BMI is consistent with morbid obesity.  - Admission 07/08/18-07/13/18 for new onset seizures with finding of left frontal brain mass with surrounding edema. She initially presented to [facility] with AMS and new seizure at home. She had second seizure in ED and later was intubated after she developed "sonourous respiration and difficult to arouse." She was transferred to [facility] ICU for further evaluation. Neurosurgery consulted. Brain mass felt likely meningioma with resection planned following recover from recent GU surgery and control of seizures. She was started on Keppra and steroids. Extubated 07/09/18. EEG normal with out-patient neurology follow-up planned. General surgery discontinued abdominal staples during hospitalization with recommendation for continued out-patient follow-up with primary surgeon Dr. Lilia Pro. Orthopedic surgeon Marchia Bond, MD was consulted for right ankle pain and  noted to have right ankle fracture. Non-operative management with CAM boot with WBAT in boot recommended with plans for follow-up xrays and consideration of surgery if needed following recovery from brain surgery.   No chest pain or SOB reported at PAT RN visit. Reviewed above with anesthesiologist Myrtie Soman, MD. If no acute CV/HF symptoms and otherwise no acute changes then it is anticipated that she can proceed as planned.   VS: BP 109/65   Pulse 71   Temp 36.8 C   Resp 20   Ht 4\' 11"  (1.499 m)   Wt 109.9 kg   LMP  (LMP Unknown)   SpO2 96%   BMI 48.94 kg/m    PROVIDERS: Hamrick, Lorin Mercy, MD is listed as PCP ([place]).  Shelva Majestic, MD is cardiologist. Last visit 02/18/17 for preoperative evaluation prior to shoulder replacement. One year follow-up recommended. Jill Alexanders, MD is neurologist. Last visit 11/14/18. He refilled patient's Keppra and referred patient back to neurosurgery.   LABS: Labs reviewed: Acceptable for surgery. (all labs ordered are listed, but only abnormal results are displayed)  Labs Reviewed  CBC - Abnormal; Notable for the following components:      Result Value   Hemoglobin 16.0 (*)    HCT 49.0 (*)    All other components within normal limits  BASIC METABOLIC PANEL - Abnormal; Notable for the following components:   Glucose, Bld 120 (*)    Creatinine, Ser 1.01 (*)    GFR calc non Af Amer 55 (*)    All other components within normal limits  TYPE AND SCREEN  ABO/RH    OTHER: EEG 07/09/18: IMPRESSION: Normal electroencephalogram, awake  and drowsy. There are no focal lateralizing or epileptiform features.   IMAGES: MRI Brain 12/09/18: IMPRESSION: - 3.1 x 3.1 x 2.9 cm left frontal meningioma unchanged in size. Large amount of vasogenic edema unchanged from the prior MRI. 9.4 mm midline shift to the right is unchanged. - Mild chronic white matter changes likely related to chronic microvascular  ischemia.  1V CXR 07/09/18 (while intubated): IMPRESSION: 1. Endotracheal tube tip 3.8 cm above the carina. 2. Cardiomegaly, pulmonary vascular congestion and small bilateral pleural effusions without significant change. 3. Basilar subsegmental atelectasis. 4.  Aortic Atherosclerosis (ICD10-I70.0).  Calcified tortuous aorta.   EKG: 12/11/18: NSR. Low voltage QRS. Cannot rule out inferior infarct (age undetermined). Overall, I think EKG is stable when compared to 02/13/17 tracing.   CV: Echo 12/26/16:  Study Conclusions: - Left ventricle: The cavity size was normal. Wall thickness wasincreased in a pattern of mild LVH. Systolic function wasvigorous. The estimated ejection fraction was in the range of 65%to 70%. Wall motion was normal; there were no regional wallmotion abnormalities. Doppler parameters are consistent withabnormal left ventricular relaxation (grade 1 diastolicdysfunction). Doppler parameters are consistent withindeterminate ventricular filling pressure. - Aortic valve: Transvalvular velocity was within the normal range.There was no stenosis. There was no regurgitation. - Mitral valve: Transvalvular velocity was within the normal range.There was no evidence for stenosis. There was no regurgitation. - Right ventricle: The cavity size was normal. Wall thickness wasnormal. Systolic function was normal. - Tricuspid valve: There was trivial regurgitation. - Pulmonic valve: There was no regurgitation. - Pulmonary arteries: Systolic pressure was within the normalrange. PA peak pressure: 28 mm Hg (S).  Cardiac cath 02/02/12 [facility]; as outlined by Dr. Evette Georges notes): Normal coronaries, apical ballooning. Findings consistent with Takotsubo cardiomyopathy.    Past Medical History:  Diagnosis Date  . Arthritis   . CHF (congestive heart failure) ([place])   . Claustrophobia   . COPD (chronic obstructive pulmonary disease) ([place])   . Depression   . Dyslipidemia    . Dyspnea   . GERD (gastroesophageal reflux disease)   . Heart murmur   . History of kidney stones   . HTN (hypertension)   . Hyperlipidemia   . Meningioma ([place]) 11/14/2018  . Obesity    BMI 55  . Pneumonia    hx  . Sleep apnea    does not use cpap  . Sleep apnea   . Takotsubo syndrome April 2013   apical ballooning, [facility] [place]    Past Surgical History:  Procedure Laterality Date  . ABDOMINAL HYSTERECTOMY    . CARDIAC CATHETERIZATION    . CHOLECYSTECTOMY  2/10  . KIDNEY STONE SURGERY    . REVERSE SHOULDER ARTHROPLASTY Left 02/24/2017   Procedure: REVERSE LEFT SHOULDER ARTHROPLASTY;  Surgeon: Netta Cedars, MD;  Location: [place];  Service: Orthopedics;  Laterality: Left;  . TOTAL KNEE ARTHROPLASTY  5/12   Lt    MEDICATIONS: . acetaminophen (TYLENOL) 500 MG tablet  . aspirin EC 81 MG tablet  . Cholecalciferol (VITAMIN D3 PO)  . DULoxetine (CYMBALTA) 60 MG capsule  . levETIRAcetam (KEPPRA) 1000 MG tablet  . Multiple Vitamin (MULTIVITAMIN WITH MINERALS) TABS tablet  . omeprazole (PRILOSEC) 20 MG capsule  . Oxycodone HCl 10 MG TABS  . solifenacin (VESICARE) 10 MG tablet   . Chlorhexidine Gluconate Cloth 2 % PADS 6 each   And  . Chlorhexidine Gluconate Cloth 2 % PADS 6 each    Myra Gianotti, PA-C Surgical Short Stay/Anesthesiology [facility] Phone [phone] [facility] Phone [phone] 12/11/2018 6:48 PM

## 2018-12-12 ENCOUNTER — Inpatient Hospital Stay (HOSPITAL_COMMUNITY)
Admission: RE | Admit: 2018-12-12 | Discharge: 2018-12-14 | DRG: 025 | Disposition: A | Discharge status: Home / Self Care | Payer: Medicare Other | Attending: Neurological Surgery | Admitting: Neurological Surgery

## 2018-12-12 ENCOUNTER — Other Ambulatory Visit: Payer: Self-pay

## 2018-12-12 ENCOUNTER — Inpatient Hospital Stay (HOSPITAL_COMMUNITY): Payer: Medicare Other

## 2018-12-12 ENCOUNTER — Encounter (HOSPITAL_COMMUNITY): Payer: Self-pay

## 2018-12-12 ENCOUNTER — Inpatient Hospital Stay (HOSPITAL_COMMUNITY): Payer: Medicare Other | Admitting: Vascular Surgery

## 2018-12-12 ENCOUNTER — Encounter (HOSPITAL_COMMUNITY)
Admission: RE | Disposition: A | Discharge status: Home / Self Care | Payer: Self-pay | Source: Home / Self Care | Attending: Neurological Surgery

## 2018-12-12 ENCOUNTER — Inpatient Hospital Stay (HOSPITAL_COMMUNITY): Payer: Medicare Other | Admitting: Certified Registered Nurse Anesthetist

## 2018-12-12 DIAGNOSIS — G40901 Epilepsy, unspecified, not intractable, with status epilepticus: Secondary | ICD-10-CM | POA: Diagnosis present

## 2018-12-12 DIAGNOSIS — E785 Hyperlipidemia, unspecified: Secondary | ICD-10-CM | POA: Diagnosis present

## 2018-12-12 DIAGNOSIS — I509 Heart failure, unspecified: Secondary | ICD-10-CM | POA: Diagnosis not present

## 2018-12-12 DIAGNOSIS — K219 Gastro-esophageal reflux disease without esophagitis: Secondary | ICD-10-CM | POA: Diagnosis present

## 2018-12-12 DIAGNOSIS — Z8249 Family history of ischemic heart disease and other diseases of the circulatory system: Secondary | ICD-10-CM

## 2018-12-12 DIAGNOSIS — F329 Major depressive disorder, single episode, unspecified: Secondary | ICD-10-CM | POA: Diagnosis present

## 2018-12-12 DIAGNOSIS — Z87891 Personal history of nicotine dependence: Secondary | ICD-10-CM | POA: Diagnosis not present

## 2018-12-12 DIAGNOSIS — G935 Compression of brain: Secondary | ICD-10-CM | POA: Diagnosis present

## 2018-12-12 DIAGNOSIS — I110 Hypertensive heart disease with heart failure: Secondary | ICD-10-CM | POA: Diagnosis not present

## 2018-12-12 DIAGNOSIS — D496 Neoplasm of unspecified behavior of brain: Secondary | ICD-10-CM | POA: Diagnosis present

## 2018-12-12 DIAGNOSIS — Z87442 Personal history of urinary calculi: Secondary | ICD-10-CM | POA: Diagnosis not present

## 2018-12-12 DIAGNOSIS — J449 Chronic obstructive pulmonary disease, unspecified: Secondary | ICD-10-CM | POA: Diagnosis not present

## 2018-12-12 DIAGNOSIS — D329 Benign neoplasm of meninges, unspecified: Secondary | ICD-10-CM | POA: Diagnosis not present

## 2018-12-12 DIAGNOSIS — G4730 Sleep apnea, unspecified: Secondary | ICD-10-CM | POA: Diagnosis present

## 2018-12-12 DIAGNOSIS — C700 Malignant neoplasm of cerebral meninges: Secondary | ICD-10-CM | POA: Diagnosis not present

## 2018-12-12 DIAGNOSIS — D320 Benign neoplasm of cerebral meninges: Secondary | ICD-10-CM | POA: Diagnosis not present

## 2018-12-12 DIAGNOSIS — Z6841 Body Mass Index (BMI) 40.0 and over, adult: Secondary | ICD-10-CM | POA: Diagnosis not present

## 2018-12-12 HISTORY — PX: APPLICATION OF CRANIAL NAVIGATION: SHX6578

## 2018-12-12 HISTORY — PX: CRANIOTOMY: SHX93

## 2018-12-12 LAB — CBC
HCT: 46.8 % — ABNORMAL HIGH (ref 36.0–46.0)
Hemoglobin: 15.6 g/dL — ABNORMAL HIGH (ref 12.0–15.0)
MCH: 31.8 pg (ref 26.0–34.0)
MCHC: 33.3 g/dL (ref 30.0–36.0)
MCV: 95.5 fL (ref 80.0–100.0)
Platelets: 228 10*3/uL (ref 150–400)
RBC: 4.9 MIL/uL (ref 3.87–5.11)
RDW: 11.4 % — ABNORMAL LOW (ref 11.5–15.5)
WBC: 9.2 10*3/uL (ref 4.0–10.5)
nRBC: 0 % (ref 0.0–0.2)

## 2018-12-12 LAB — PREPARE RBC (CROSSMATCH)

## 2018-12-12 LAB — MRSA PCR SCREENING: MRSA by PCR: NEGATIVE

## 2018-12-12 LAB — CREATININE, SERUM
Creatinine, Ser: 0.88 mg/dL (ref 0.44–1.00)
GFR calc Af Amer: >60 mL/min (ref >60)
GFR calc non Af Amer: >60 mL/min (ref >60)

## 2018-12-12 SURGERY — CRANIOTOMY TUMOR EXCISION
Anesthesia: General | Site: Head

## 2018-12-12 MED ORDER — MIDAZOLAM HCL 2 MG/2ML IJ SOLN
INTRAMUSCULAR | Status: AC
  Filled 2018-12-12: qty 2

## 2018-12-12 MED ORDER — HYDROMORPHONE HCL 1 MG/ML IJ SOLN
0.5000 mg | INTRAMUSCULAR | Status: DC | PRN
  Administered 2018-12-12 – 2018-12-13 (×3): 0.5 mg via INTRAVENOUS
  Filled 2018-12-12 (×3): qty 1

## 2018-12-12 MED ORDER — PROPOFOL 10 MG/ML IV BOLUS
INTRAVENOUS | Status: DC | PRN
  Administered 2018-12-12: 230 mg via INTRAVENOUS

## 2018-12-12 MED ORDER — BACITRACIN ZINC 500 UNIT/GM EX OINT
TOPICAL_OINTMENT | CUTANEOUS | Status: DC | PRN
  Administered 2018-12-12 (×2): 1 via TOPICAL

## 2018-12-12 MED ORDER — PHENYLEPHRINE HCL 10 MG/ML IJ SOLN
INTRAMUSCULAR | Status: DC | PRN
  Administered 2018-12-12: 120 ug via INTRAVENOUS
  Administered 2018-12-12: 80 ug via INTRAVENOUS

## 2018-12-12 MED ORDER — FAMOTIDINE IN NACL 20-0.9 MG/50ML-% IV SOLN
20.0000 mg | Freq: Two times a day (BID) | INTRAVENOUS | Status: DC
  Administered 2018-12-12 – 2018-12-13 (×3): 20 mg via INTRAVENOUS
  Filled 2018-12-12 (×3): qty 50

## 2018-12-12 MED ORDER — ACETAMINOPHEN 500 MG PO TABS
ORAL_TABLET | ORAL | Status: AC
  Administered 2018-12-12: 1000 mg via ORAL
  Filled 2018-12-12: qty 2

## 2018-12-12 MED ORDER — CEFAZOLIN SODIUM-DEXTROSE 2-4 GM/100ML-% IV SOLN
2.0000 g | Freq: Three times a day (TID) | INTRAVENOUS | Status: AC
  Administered 2018-12-12 – 2018-12-13 (×2): 2 g via INTRAVENOUS
  Filled 2018-12-12 (×2): qty 100

## 2018-12-12 MED ORDER — SUGAMMADEX SODIUM 200 MG/2ML IV SOLN
INTRAVENOUS | Status: DC | PRN
  Administered 2018-12-12: 218.6 mg via INTRAVENOUS

## 2018-12-12 MED ORDER — SODIUM CHLORIDE 0.9 % IV SOLN
0.0500 ug/kg/min | INTRAVENOUS | Status: AC
  Administered 2018-12-12: 0.15 ug/kg/min via INTRAVENOUS
  Filled 2018-12-12: qty 4000

## 2018-12-12 MED ORDER — LIDOCAINE 2% (20 MG/ML) 5 ML SYRINGE
INTRAMUSCULAR | Status: AC
  Filled 2018-12-12: qty 5

## 2018-12-12 MED ORDER — LIDOCAINE-EPINEPHRINE 1 %-1:100000 IJ SOLN
INTRAMUSCULAR | Status: DC | PRN
  Administered 2018-12-12: 10 mL

## 2018-12-12 MED ORDER — DULOXETINE HCL 60 MG PO CPEP
60.0000 mg | ORAL_CAPSULE | Freq: Every day | ORAL | Status: DC
  Administered 2018-12-12 – 2018-12-14 (×3): 60 mg via ORAL
  Filled 2018-12-12 (×3): qty 1

## 2018-12-12 MED ORDER — ONDANSETRON HCL 4 MG/2ML IJ SOLN: 4.0000 mg | INTRAMUSCULAR | Status: DC | PRN

## 2018-12-12 MED ORDER — DARIFENACIN HYDROBROMIDE ER 15 MG PO TB24
15.0000 mg | ORAL_TABLET | Freq: Every day | ORAL | Status: DC
  Administered 2018-12-13 – 2018-12-14 (×2): 15 mg via ORAL
  Filled 2018-12-12 (×2): qty 1

## 2018-12-12 MED ORDER — ACETAMINOPHEN 650 MG RE SUPP: 650.0000 mg | RECTAL | Status: DC | PRN

## 2018-12-12 MED ORDER — ALBUTEROL SULFATE HFA 108 (90 BASE) MCG/ACT IN AERS
INHALATION_SPRAY | RESPIRATORY_TRACT | Status: DC | PRN
  Administered 2018-12-12: 4 via RESPIRATORY_TRACT

## 2018-12-12 MED ORDER — LACTATED RINGERS IV SOLN: INTRAVENOUS | Status: DC

## 2018-12-12 MED ORDER — THROMBIN 5000 UNITS EX SOLR
OROMUCOSAL | Status: DC | PRN
  Administered 2018-12-12: 15:00:00 via TOPICAL

## 2018-12-12 MED ORDER — ORAL CARE MOUTH RINSE
15.0000 mL | Freq: Two times a day (BID) | OROMUCOSAL | Status: DC
  Administered 2018-12-12: 15 mL via OROMUCOSAL

## 2018-12-12 MED ORDER — FENTANYL CITRATE (PF) 250 MCG/5ML IJ SOLN
INTRAMUSCULAR | Status: DC | PRN
  Administered 2018-12-12: 100 ug via INTRAVENOUS

## 2018-12-12 MED ORDER — ALBUTEROL SULFATE HFA 108 (90 BASE) MCG/ACT IN AERS
INHALATION_SPRAY | RESPIRATORY_TRACT | Status: AC
  Filled 2018-12-12: qty 6.7

## 2018-12-12 MED ORDER — LIDOCAINE-EPINEPHRINE 1 %-1:100000 IJ SOLN
INTRAMUSCULAR | Status: AC
  Filled 2018-12-12: qty 1

## 2018-12-12 MED ORDER — ONDANSETRON HCL 4 MG PO TABS: 4.0000 mg | ORAL_TABLET | ORAL | Status: DC | PRN

## 2018-12-12 MED ORDER — LEVETIRACETAM 500 MG PO TABS
1000.0000 mg | ORAL_TABLET | Freq: Two times a day (BID) | ORAL | Status: DC
  Administered 2018-12-12 – 2018-12-14 (×4): 1000 mg via ORAL
  Filled 2018-12-12 (×5): qty 2

## 2018-12-12 MED ORDER — DEXAMETHASONE SODIUM PHOSPHATE 10 MG/ML IJ SOLN
INTRAMUSCULAR | Status: DC | PRN
  Administered 2018-12-12: 10 mg via INTRAVENOUS

## 2018-12-12 MED ORDER — BACITRACIN ZINC 500 UNIT/GM EX OINT
TOPICAL_OINTMENT | CUTANEOUS | Status: AC
  Filled 2018-12-12: qty 28.35

## 2018-12-12 MED ORDER — PHENYLEPHRINE 40 MCG/ML (10ML) SYRINGE FOR IV PUSH (FOR BLOOD PRESSURE SUPPORT)
PREFILLED_SYRINGE | INTRAVENOUS | Status: AC
  Filled 2018-12-12: qty 10

## 2018-12-12 MED ORDER — ACETAMINOPHEN 500 MG PO TABS
1000.0000 mg | ORAL_TABLET | Freq: Once | ORAL | Status: AC
  Administered 2018-12-12: 1000 mg via ORAL

## 2018-12-12 MED ORDER — LABETALOL HCL 5 MG/ML IV SOLN
10.0000 mg | INTRAVENOUS | Status: DC | PRN
  Administered 2018-12-12: 20 mg via INTRAVENOUS
  Filled 2018-12-12 (×2): qty 4

## 2018-12-12 MED ORDER — SODIUM CHLORIDE 0.9 % IV SOLN
INTRAVENOUS | Status: DC | PRN
  Administered 2018-12-12: 75 ug/min via INTRAVENOUS
  Administered 2018-12-12: 35 ug/min via INTRAVENOUS

## 2018-12-12 MED ORDER — ONDANSETRON HCL 4 MG/2ML IJ SOLN
INTRAMUSCULAR | Status: AC
  Filled 2018-12-12: qty 2

## 2018-12-12 MED ORDER — PROPOFOL 10 MG/ML IV BOLUS
INTRAVENOUS | Status: AC
  Filled 2018-12-12: qty 20

## 2018-12-12 MED ORDER — FENTANYL CITRATE (PF) 250 MCG/5ML IJ SOLN
INTRAMUSCULAR | Status: AC
  Filled 2018-12-12: qty 5

## 2018-12-12 MED ORDER — SODIUM CHLORIDE 0.9 % IV SOLN
INTRAVENOUS | Status: DC
  Administered 2018-12-12 (×3): via INTRAVENOUS

## 2018-12-12 MED ORDER — CEFAZOLIN SODIUM-DEXTROSE 2-4 GM/100ML-% IV SOLN
2.0000 g | INTRAVENOUS | Status: AC
  Administered 2018-12-12: 2 g via INTRAVENOUS

## 2018-12-12 MED ORDER — ROCURONIUM BROMIDE 50 MG/5ML IV SOSY
PREFILLED_SYRINGE | INTRAVENOUS | Status: DC | PRN
  Administered 2018-12-12 (×2): 50 mg via INTRAVENOUS

## 2018-12-12 MED ORDER — THROMBIN 5000 UNITS EX SOLR
CUTANEOUS | Status: AC
  Filled 2018-12-12: qty 10000

## 2018-12-12 MED ORDER — OXYCODONE HCL 5 MG PO TABS
10.0000 mg | ORAL_TABLET | ORAL | Status: DC | PRN
  Administered 2018-12-12 – 2018-12-14 (×6): 10 mg via ORAL
  Filled 2018-12-12 (×6): qty 2

## 2018-12-12 MED ORDER — MIDAZOLAM HCL 2 MG/2ML IJ SOLN
INTRAMUSCULAR | Status: DC | PRN
  Administered 2018-12-12: 2 mg via INTRAVENOUS

## 2018-12-12 MED ORDER — DEXAMETHASONE SODIUM PHOSPHATE 10 MG/ML IJ SOLN
INTRAMUSCULAR | Status: AC
  Filled 2018-12-12: qty 1

## 2018-12-12 MED ORDER — ESMOLOL HCL 100 MG/10ML IV SOLN
INTRAVENOUS | Status: DC | PRN
  Administered 2018-12-12 (×2): 50 mg via INTRAVENOUS

## 2018-12-12 MED ORDER — THROMBIN 20000 UNITS EX SOLR
CUTANEOUS | Status: AC
  Filled 2018-12-12: qty 20000

## 2018-12-12 MED ORDER — ARTIFICIAL TEARS OPHTHALMIC OINT
TOPICAL_OINTMENT | OPHTHALMIC | Status: AC
  Filled 2018-12-12: qty 10.5

## 2018-12-12 MED ORDER — ROCURONIUM BROMIDE 50 MG/5ML IV SOSY
PREFILLED_SYRINGE | INTRAVENOUS | Status: AC
  Filled 2018-12-12: qty 15

## 2018-12-12 MED ORDER — POLYETHYLENE GLYCOL 3350 17 G PO PACK: 17.0000 g | PACK | Freq: Every day | ORAL | Status: DC | PRN

## 2018-12-12 MED ORDER — SODIUM CHLORIDE 0.9% IV SOLUTION: Freq: Once | INTRAVENOUS | Status: DC

## 2018-12-12 MED ORDER — GADOBUTROL 1 MMOL/ML IV SOLN
10.0000 mL | Freq: Once | INTRAVENOUS | Status: AC | PRN
  Administered 2018-12-12: 10 mL via INTRAVENOUS

## 2018-12-12 MED ORDER — ESMOLOL HCL 100 MG/10ML IV SOLN
INTRAVENOUS | Status: AC
  Filled 2018-12-12: qty 10

## 2018-12-12 MED ORDER — PROMETHAZINE HCL 25 MG PO TABS: 12.5000 mg | ORAL_TABLET | ORAL | Status: DC | PRN

## 2018-12-12 MED ORDER — EPHEDRINE 5 MG/ML INJ
INTRAVENOUS | Status: AC
  Filled 2018-12-12: qty 20

## 2018-12-12 MED ORDER — GLYCOPYRROLATE 0.2 MG/ML IJ SOLN
INTRAMUSCULAR | Status: DC | PRN
  Administered 2018-12-12: 0.2 mg via INTRAVENOUS

## 2018-12-12 MED ORDER — ACETAMINOPHEN 325 MG PO TABS
650.0000 mg | ORAL_TABLET | ORAL | Status: DC | PRN
  Administered 2018-12-13: 650 mg via ORAL
  Filled 2018-12-12: qty 2

## 2018-12-12 MED ORDER — ONDANSETRON HCL 4 MG/2ML IJ SOLN
INTRAMUSCULAR | Status: DC | PRN
  Administered 2018-12-12: 4 mg via INTRAVENOUS

## 2018-12-12 MED ORDER — DOCUSATE SODIUM 100 MG PO CAPS
100.0000 mg | ORAL_CAPSULE | Freq: Two times a day (BID) | ORAL | Status: DC
  Administered 2018-12-12 – 2018-12-14 (×4): 100 mg via ORAL
  Filled 2018-12-12 (×4): qty 1

## 2018-12-12 MED ORDER — LIDOCAINE 2% (20 MG/ML) 5 ML SYRINGE
INTRAMUSCULAR | Status: DC | PRN
  Administered 2018-12-12: 60 mg via INTRAVENOUS

## 2018-12-12 MED ORDER — OXYCODONE HCL 5 MG PO TABS: 5.0000 mg | ORAL_TABLET | ORAL | Status: DC | PRN

## 2018-12-12 MED ORDER — PANTOPRAZOLE SODIUM 40 MG PO TBEC
40.0000 mg | DELAYED_RELEASE_TABLET | Freq: Every day | ORAL | Status: DC
  Administered 2018-12-13: 40 mg via ORAL
  Filled 2018-12-12 (×3): qty 1

## 2018-12-12 MED ORDER — ESMOLOL HCL 100 MG/10ML IV SOLN: INTRAVENOUS | Status: DC | PRN

## 2018-12-12 MED ORDER — 0.9 % SODIUM CHLORIDE (POUR BTL) OPTIME
TOPICAL | Status: DC | PRN
  Administered 2018-12-12 (×2): 1000 mL

## 2018-12-12 MED ORDER — GLYCOPYRROLATE PF 0.2 MG/ML IJ SOSY
PREFILLED_SYRINGE | INTRAMUSCULAR | Status: AC
  Filled 2018-12-12: qty 2

## 2018-12-12 MED ORDER — HEPARIN SODIUM (PORCINE) 5000 UNIT/ML IJ SOLN
5000.0000 [IU] | Freq: Three times a day (TID) | INTRAMUSCULAR | Status: DC
  Administered 2018-12-14: 5000 [IU] via SUBCUTANEOUS
  Filled 2018-12-12: qty 1

## 2018-12-12 SURGICAL SUPPLY — 95 items
APL SKNCLS STERI-STRIP NONHPOA (GAUZE/BANDAGES/DRESSINGS)
BENZOIN TINCTURE PRP APPL 2/3 (GAUZE/BANDAGES/DRESSINGS) IMPLANT
BLADE CLIPPER SURG (BLADE) ×3 IMPLANT
BLADE SURG 15 STRL LF DISP TIS (BLADE) IMPLANT
BLADE SURG 15 STRL SS (BLADE)
BNDG CMPR 75X41 PLY HI ABS (GAUZE/BANDAGES/DRESSINGS)
BNDG GAUZE ELAST 4 BULKY (GAUZE/BANDAGES/DRESSINGS) IMPLANT
BNDG STRETCH 4X75 STRL LF (GAUZE/BANDAGES/DRESSINGS) IMPLANT
BUR ACORN 9.0 PRECISION (BURR) ×3 IMPLANT
BUR ROUND FLUTED 4 SOFT TCH (BURR) IMPLANT
BUR SPIRAL ROUTER 2.3 (BUR) ×3 IMPLANT
CANISTER SUCT 3000ML PPV (MISCELLANEOUS) ×6 IMPLANT
CATH VENTRIC 35X38 W/TROCAR LG (CATHETERS) IMPLANT
CLIP VESOCCLUDE MED 6/CT (CLIP) IMPLANT
CONT SPEC 4OZ CLIKSEAL STRL BL (MISCELLANEOUS) ×3 IMPLANT
COVER MAYO STAND STRL (DRAPES) IMPLANT
COVER WAND RF STERILE (DRAPES) ×2 IMPLANT
DECANTER SPIKE VIAL GLASS SM (MISCELLANEOUS) ×2 IMPLANT
DRAIN SUBARACHNOID (WOUND CARE) IMPLANT
DRAPE HALF SHEET 40X57 (DRAPES) ×3 IMPLANT
DRAPE MICROSCOPE LEICA (MISCELLANEOUS) ×1 IMPLANT
DRAPE NEUROLOGICAL W/INCISE (DRAPES) ×3 IMPLANT
DRAPE STERI IOBAN 125X83 (DRAPES) IMPLANT
DRAPE SURG 17X23 STRL (DRAPES) IMPLANT
DRAPE WARM FLUID 44X44 (DRAPE) ×3 IMPLANT
DRSG ADAPTIC 3X8 NADH LF (GAUZE/BANDAGES/DRESSINGS) IMPLANT
DRSG TELFA 3X8 NADH (GAUZE/BANDAGES/DRESSINGS) IMPLANT
DURAPREP 6ML APPLICATOR 50/CS (WOUND CARE) ×3 IMPLANT
ELECT REM PT RETURN 9FT ADLT (ELECTROSURGICAL) ×3
ELECTRODE REM PT RTRN 9FT ADLT (ELECTROSURGICAL) ×2 IMPLANT
EVACUATOR 1/8 PVC DRAIN (DRAIN) IMPLANT
EVACUATOR SILICONE 100CC (DRAIN) IMPLANT
FORCEPS BIPOLAR SPETZLER 8 1.0 (NEUROSURGERY SUPPLIES) ×3 IMPLANT
GAUZE 4X4 16PLY RFD (DISPOSABLE) IMPLANT
GAUZE SPONGE 4X4 12PLY STRL (GAUZE/BANDAGES/DRESSINGS) IMPLANT
GLOVE BIO SURGEON STRL SZ7 (GLOVE) IMPLANT
GLOVE BIO SURGEON STRL SZ7.5 (GLOVE) ×4 IMPLANT
GLOVE BIOGEL PI IND STRL 7.0 (GLOVE) IMPLANT
GLOVE BIOGEL PI IND STRL 7.5 (GLOVE) ×2 IMPLANT
GLOVE BIOGEL PI IND STRL 8 (GLOVE) IMPLANT
GLOVE BIOGEL PI INDICATOR 7.0 (GLOVE)
GLOVE BIOGEL PI INDICATOR 7.5 (GLOVE) ×1
GLOVE BIOGEL PI INDICATOR 8 (GLOVE) ×4
GLOVE ECLIPSE 7.5 STRL STRAW (GLOVE) ×3 IMPLANT
GOWN STRL REUS W/ TWL LRG LVL3 (GOWN DISPOSABLE) ×4 IMPLANT
GOWN STRL REUS W/ TWL XL LVL3 (GOWN DISPOSABLE) IMPLANT
GOWN STRL REUS W/TWL 2XL LVL3 (GOWN DISPOSABLE) ×2 IMPLANT
GOWN STRL REUS W/TWL LRG LVL3 (GOWN DISPOSABLE) ×3
GOWN STRL REUS W/TWL XL LVL3 (GOWN DISPOSABLE)
GRAFT DURAGEN MATRIX 3WX3L (Graft) ×3 IMPLANT
GRAFT DURAGEN MATRIX 3X3 SNGL (Graft) IMPLANT
HEMOSTAT POWDER KIT SURGIFOAM (HEMOSTASIS) ×3 IMPLANT
HEMOSTAT SURGICEL 2X14 (HEMOSTASIS) ×2 IMPLANT
HOOK DURA 1/2IN (MISCELLANEOUS) ×3 IMPLANT
IV NS 1000ML (IV SOLUTION)
IV NS 1000ML BAXH (IV SOLUTION) ×2 IMPLANT
KIT BASIN OR (CUSTOM PROCEDURE TRAY) ×3 IMPLANT
KIT DRAIN CSF ACCUDRAIN (MISCELLANEOUS) IMPLANT
KIT TURNOVER KIT B (KITS) ×3 IMPLANT
MARKER SPHERE PSV REFLC 13MM (MARKER) ×6 IMPLANT
NDL SPNL 18GX3.5 QUINCKE PK (NEEDLE) IMPLANT
NEEDLE HYPO 22GX1.5 SAFETY (NEEDLE) ×3 IMPLANT
NEEDLE SPNL 18GX3.5 QUINCKE PK (NEEDLE) IMPLANT
NS IRRIG 1000ML POUR BTL (IV SOLUTION) ×8 IMPLANT
PACK CRANIOTOMY CUSTOM (CUSTOM PROCEDURE TRAY) ×3 IMPLANT
PAD DRESSING TELFA 3X8 NADH (GAUZE/BANDAGES/DRESSINGS) IMPLANT
PATTIES SURGICAL .25X.25 (GAUZE/BANDAGES/DRESSINGS) IMPLANT
PATTIES SURGICAL .5 X.5 (GAUZE/BANDAGES/DRESSINGS) IMPLANT
PATTIES SURGICAL .5 X3 (DISPOSABLE) IMPLANT
PATTIES SURGICAL 1/4 X 3 (GAUZE/BANDAGES/DRESSINGS) IMPLANT
PATTIES SURGICAL 1X1 (DISPOSABLE) IMPLANT
PIN MAYFIELD SKULL DISP (PIN) ×3 IMPLANT
PLATE 1.5  2HOLE LNG NEURO (Plate) ×2 IMPLANT
PLATE 1.5 2HOLE LNG NEURO (Plate) IMPLANT
PLATE 1.5/0.5 13MM BURR HOLE (Plate) ×2 IMPLANT
RUBBERBAND STERILE (MISCELLANEOUS) ×2 IMPLANT
SCREW SELF DRILL HT 1.5/4MM (Screw) ×11 IMPLANT
SET TUBING W/EXT DISP (INSTRUMENTS) ×2 IMPLANT
SPECIMEN JAR SMALL (MISCELLANEOUS) IMPLANT
SPONGE NEURO XRAY DETECT 1X3 (DISPOSABLE) IMPLANT
SPONGE SURGIFOAM ABS GEL 100 (HEMOSTASIS) ×2 IMPLANT
STAPLER VISISTAT 35W (STAPLE) ×3 IMPLANT
SUT ETHILON 3 0 FSL (SUTURE) ×1 IMPLANT
SUT ETHILON 3 0 PS 1 (SUTURE) IMPLANT
SUT MNCRL AB 3-0 PS2 18 (SUTURE) IMPLANT
SUT NURALON 4 0 TR CR/8 (SUTURE) ×7 IMPLANT
SUT SILK 0 TIES 10X30 (SUTURE) IMPLANT
SUT VIC AB 2-0 CP2 18 (SUTURE) ×6 IMPLANT
TIP STRAIGHT 25KHZ (INSTRUMENTS) ×2 IMPLANT
TOWEL GREEN STERILE (TOWEL DISPOSABLE) ×3 IMPLANT
TOWEL GREEN STERILE FF (TOWEL DISPOSABLE) ×3 IMPLANT
TRAY FOLEY MTR SLVR 16FR STAT (SET/KITS/TRAYS/PACK) ×3 IMPLANT
TUBE CONNECTING 12X1/4 (SUCTIONS) ×2 IMPLANT
UNDERPAD 30X30 (UNDERPADS AND DIAPERS) ×2 IMPLANT
WATER STERILE IRR 1000ML POUR (IV SOLUTION) ×3 IMPLANT

## 2018-12-12 NOTE — Anesthesia Procedure Notes (Signed)
Arterial Line Insertion Start/End2/19/2020 12:21 PM Performed by: Kathryne Hitch, CRNA, CRNA  Left, radial was placed Catheter size: 20 G Hand hygiene performed  and maximum sterile barriers used   Attempts: 1 Procedure performed without using ultrasound guided technique. Following insertion, dressing applied and Biopatch. Post procedure assessment: normal  Patient tolerated the procedure well with no immediate complications.

## 2018-12-12 NOTE — Anesthesia Postprocedure Evaluation (Signed)
Anesthesia Post Note  Patient: Alexis Edwards  Procedure(s) Performed: Left Craniotomy for tumor resection with brainlab (Left Head) APPLICATION OF CRANIAL NAVIGATION (N/A Head)     Patient location during evaluation: PACU Anesthesia Type: General Level of consciousness: awake Pain management: pain level controlled Vital Signs Assessment: post-procedure vital signs reviewed and stable Respiratory status: spontaneous breathing, nonlabored ventilation, respiratory function stable and patient connected to nasal cannula oxygen Cardiovascular status: blood pressure returned to baseline and stable Postop Assessment: no apparent nausea or vomiting Anesthetic complications: no    Last Vitals:  Vitals:   12/12/18 1900 12/12/18 2000  BP: 130/64 (!) 143/60  Pulse: 69 70  Resp: 15 14  Temp:  36.7 C  SpO2: (!) 89% (!) 89%    Last Pain:  Vitals:   12/12/18 2000  TempSrc: Oral  PainSc: 8                  Ryan P Ellender

## 2018-12-12 NOTE — Progress Notes (Signed)
Pharmacy sent up [place] for 4N27.  It was for the previous patient which was in that room.  RN called Pharmacy and spoke to [place].  Was told to walk the medication to 3W where the patient is now.  Went to 3W and handed the [place] to [employer] at the front desk.

## 2018-12-12 NOTE — Anesthesia Procedure Notes (Signed)
Procedure Name: Intubation Date/Time: 12/12/2018 1:43 PM Performed by: Kathryne Hitch, CRNA Pre-anesthesia Checklist: Patient identified, Emergency Drugs available, Suction available, Patient being monitored and Timeout performed Patient Re-evaluated:Patient Re-evaluated prior to induction Oxygen Delivery Method: Circle system utilized Preoxygenation: Pre-oxygenation with 100% oxygen Induction Type: IV induction Ventilation: Mask ventilation without difficulty and Oral airway inserted - appropriate to patient size Laryngoscope Size: Sabra Heck and 2 Grade View: Grade I Tube type: Oral Tube size: 7.0 mm Number of attempts: 1 Airway Equipment and Method: Stylet and Oral airway Placement Confirmation: ETT inserted through vocal cords under direct vision,  positive ETCO2 and breath sounds checked- equal and bilateral Secured at: 21 cm Tube secured with: Tape Dental Injury: Teeth and Oropharynx as per pre-operative assessment

## 2018-12-12 NOTE — H&P (Signed)
Surgical H&P Update  HPI: 73 y.o. woman with left frontal meningioma, here for resection. Tumor was discovered after pt presented with status epilepticus. No further seizures since then. She had multiple illnesses that she had to recover from, but has now recovered and is ready for surgery. No changes in health since she was last seen.   PMHx:  Past Medical History:  Diagnosis Date  . Arthritis   . CHF (congestive heart failure) ([place])   . Claustrophobia   . COPD (chronic obstructive pulmonary disease) ([place])   . Depression   . Dyslipidemia   . Dyspnea   . GERD (gastroesophageal reflux disease)   . Heart murmur   . History of kidney stones   . HTN (hypertension)   . Hyperlipidemia   . Meningioma ([place]) 11/14/2018  . Obesity    BMI 55  . Pneumonia    hx  . Sleep apnea    does not use cpap  . Sleep apnea   . Takotsubo syndrome April 2013   apical ballooning, [facility]- [place]   FamHx:  Family History  Problem Relation Age of Onset  . Coronary artery disease Brother 50       stent  . Diabetes Mother   . Hypertension Mother   . Cancer Mother   . Coronary artery disease Sister   . Diabetes Sister   . Cancer Sister    SocHx:  reports that she quit smoking about 32 years ago. Her smoking use included cigarettes. She has a 20.00 pack-year smoking history. She has never used smokeless tobacco. She reports current alcohol use. She reports that she does not use drugs.  Physical Exam: AOx3, PERRL, FS, TM  Strength 5/5 x4, SILTx4  Assesment/Plan: 73 y.o. woman with meningioma, here for surgical resection. Risks, benefits, and alternatives discussed and the patient would like to continue with surgery. -OR today -4N post-op  Judith Part, MD 12/12/18 1:22 PM

## 2018-12-12 NOTE — Op Note (Signed)
PATIENT: Alexis Edwards  DAY OF SURGERY: 12/12/18   PRE-OPERATIVE DIAGNOSIS:  Left frontal meningioma   POST-OPERATIVE DIAGNOSIS:  Left frontal meningioma   PROCEDURE:  Left craniotomy for resection of meningioma, use of operating microscope, frameless stereotaxy   SURGEON:  Surgeon(s) and Role:    Judith Part, MD - Primary   ANESTHESIA: ETGA   BRIEF HISTORY: This is a 73 year old woman who presented with status epilepticus. The patient was found to have a left frontal meningioma with significant vasogenic edema and midline shift. This was discussed with the patient as well as risks, benefits, and alternatives and wished to proceed with surgical resection.   OPERATIVE DETAIL: The patient was taken to the operating room and placed on the OR table in the supine position. A formal time out was performed with two patient identifiers and confirmed the operative site. Anesthesia was induced by the anesthesia team. The [place] head holder was applied to the head and a registration array was attached to the [place]. This was co-registered with the patient's preoperative imaging, the fit appeared to be acceptable. Using frameless stereotaxy, the operative trajectory was planned and the incision was marked. Hair was clipped with surgical clippers over the incision and the area was then prepped and draped in a sterile fashion.  A linear, bicoronal incision was placed in the frontal region, medial to the temporalis bilaterally. A craniotomy was performed centered over the lesion including exposure of the superior sagittal sinus. The tumor was significantly adherent to the bone flap through a dural defect. The dura was incised around the tumor. The lesion was then resected back to its dural edges en bloc and sent to pathology for analysis. The dural edges were then cauterized up to the edge of the sinus and along the bone edges in all directions. Hemostasis was obtained, duragen was placed over  the dural defect, and the bone flap was shaved down where there was tumor invasion or hyperostosis and reapproximated with titanium plates and screws. All instrument and sponge counts were correct, the incision was then copiously irrigated and closed in layers. The patient was then returned to anesthesia for emergence. No apparent complications at the completion of the procedure.   EBL:  251mL   DRAINS: none   SPECIMENS: Left frontal brain tumor   Judith Part, MD 12/12/18 1:41 PM

## 2018-12-12 NOTE — Transfer of Care (Signed)
Immediate Anesthesia Transfer of Care Note  Patient: Alexis Edwards  Procedure(s) Performed: Left Craniotomy for tumor resection with brainlab (Left Head) APPLICATION OF CRANIAL NAVIGATION (N/A Head)  Patient Location: PACU  Anesthesia Type:General  Level of Consciousness: awake, alert , oriented and patient cooperative  Airway & Oxygen Therapy: Patient Spontanous Breathing and Patient connected to face mask oxygen  Post-op Assessment: Report given to RN, Post -op Vital signs reviewed and stable and Patient moving all extremities  Post vital signs: Reviewed and stable  Last Vitals:  Vitals Value Taken Time  BP    Temp 36.5 C 12/12/2018  4:48 PM  Pulse 65 12/12/2018  4:48 PM  Resp 20 12/12/2018  4:48 PM  SpO2 100 % 12/12/2018  4:48 PM  Vitals shown include unvalidated device data.  Last Pain:  Vitals:   12/12/18 1122  TempSrc:   PainSc: 2       Patients Stated Pain Goal: 1 (93/26/71 2458)  Complications: No apparent anesthesia complications

## 2018-12-12 NOTE — Brief Op Note (Signed)
12/12/2018  4:33 PM  PATIENT:  Alexis Edwards  73 y.o. female  PRE-OPERATIVE DIAGNOSIS:  Left frontal meningioma  POST-OPERATIVE DIAGNOSIS:  Left frontal meningioma  PROCEDURE:  Procedure(s): Left Craniotomy for tumor resection with brainlab (Left) APPLICATION OF CRANIAL NAVIGATION (N/A)  SURGEON:  Surgeon(s) and Role:    * Judith Part, MD - Primary  PHYSICIAN ASSISTANT:   ASSISTANTS: none   ANESTHESIA:   general  EBL:  250cc  BLOOD ADMINISTERED:none  DRAINS: none   LOCAL MEDICATIONS USED:  LIDOCAINE   SPECIMEN:  Left frontal brain tumor  DISPOSITION OF SPECIMEN:  PATHOLOGY  COUNTS:  YES  TOURNIQUET:  * No tourniquets in log *  DICTATION: .Note written in EPIC  PLAN OF CARE: Admit to inpatient   PATIENT DISPOSITION:  PACU - hemodynamically stable.   Delay start of Pharmacological VTE agent (>24hrs) due to surgical blood loss or risk of bleeding: yes

## 2018-12-13 ENCOUNTER — Encounter (HOSPITAL_COMMUNITY): Payer: Self-pay | Admitting: Neurological Surgery

## 2018-12-13 LAB — POCT I-STAT 7, (LYTES, BLD GAS, ICA,H+H)
Acid-Base Excess: 2 mmol/L (ref 0.0–2.0)
Bicarbonate: 26.4 mmol/L (ref 20.0–28.0)
Calcium, Ion: 1.15 mmol/L (ref 1.15–1.40)
HCT: 38 % (ref 36.0–46.0)
Hemoglobin: 12.9 g/dL (ref 12.0–15.0)
O2 Saturation: 100 %
Patient temperature: 36.1
Potassium: 3.7 mmol/L (ref 3.5–5.1)
Sodium: 142 mmol/L (ref 135–145)
TCO2: 28 mmol/L (ref 22–32)
pCO2 arterial: 40 mmHg (ref 32.0–48.0)
pH, Arterial: 7.424 (ref 7.350–7.450)
pO2, Arterial: 231 mmHg — ABNORMAL HIGH (ref 83.0–108.0)

## 2018-12-13 NOTE — Progress Notes (Signed)
Neurosurgery Service Progress Note  Subjective: No acute events overnight, no new weakness / numbness / parasthesias, no seizure-like activity or auras   Objective: Vitals:   12/13/18 0400 12/13/18 0500 12/13/18 0600 12/13/18 0700  BP: (!) 106/59 105/66 (!) 107/57 (!) 117/55  Pulse: 71 72 67 70  Resp: 11 15 13 17   Temp: 98.4 F (36.9 C)     TempSrc: Oral     SpO2: 94% 95% 94% 95%  Weight:      Height:       Temp (24hrs), Avg:98.1 F (36.7 C), Min:97.7 F (36.5 C), Max:98.5 F (36.9 C)  CBC Latest Ref Rng & Units 12/12/2018 12/11/2018 07/13/2018  WBC 4.0 - 10.5 K/uL 9.2 8.2 18.9(H)  Hemoglobin 12.0 - 15.0 g/dL 15.6(H) 16.0(H) 14.1  Hematocrit 36.0 - 46.0 % 46.8(H) 49.0(H) 41.5  Platelets 150 - 400 K/uL 228 212 260   BMP Latest Ref Rng & Units 12/12/2018 12/11/2018 12/08/2018  Glucose 70 - 99 mg/dL - 120(H) -  BUN 8 - 23 mg/dL - 17 14  Creatinine 0.44 - 1.00 mg/dL 0.88 1.01(H) 0.97  Sodium 135 - 145 mmol/L - 143 -  Potassium 3.5 - 5.1 mmol/L - 3.8 -  Chloride 98 - 111 mmol/L - 109 -  CO2 22 - 32 mmol/L - 25 -  Calcium 8.9 - 10.3 mg/dL - 9.3 -    Intake/Output Summary (Last 24 hours) at 12/13/2018 8299 Last data filed at 12/13/2018 0500 Gross per 24 hour  Intake 2224.65 ml  Output 1785 ml  Net 439.65 ml    Current Facility-Administered Medications:  .  0.9 %  sodium chloride infusion (Manually program via Guardrails IV Fluids), , Intravenous, Once, Kathryne Hitch, CRNA .  0.9 %  sodium chloride infusion, , Intravenous, Continuous, Ellender, Karyl Kinnier, MD, Last Rate: 10 mL/hr at 12/13/18 0400 .  acetaminophen (TYLENOL) tablet 650 mg, 650 mg, Oral, Q4H PRN **OR** acetaminophen (TYLENOL) suppository 650 mg, 650 mg, Rectal, Q4H PRN, Judith Part, MD .  darifenacin (ENABLEX) 24 hr tablet 15 mg, 15 mg, Oral, Daily, Ostergard, Thomas A, MD .  docusate sodium (COLACE) capsule 100 mg, 100 mg, Oral, BID, Judith Part, MD, 100 mg at 12/12/18 2219 .  DULoxetine  (CYMBALTA) DR capsule 60 mg, 60 mg, Oral, Daily, Judith Part, MD, 60 mg at 12/12/18 1922 .  famotidine (PEPCID) IVPB 20 mg premix, 20 mg, Intravenous, Q12H, Judith Part, MD, Stopped at 12/12/18 2249 .  [START ON 12/14/2018] heparin injection 5,000 Units, 5,000 Units, Subcutaneous, Q8H, Ostergard, Thomas A, MD .  HYDROmorphone (DILAUDID) injection 0.5 mg, 0.5 mg, Intravenous, Q3H PRN, Judith Part, MD, 0.5 mg at 12/13/18 0214 .  labetalol (NORMODYNE,TRANDATE) injection 10-40 mg, 10-40 mg, Intravenous, Q10 min PRN, Judith Part, MD, 20 mg at 12/12/18 2218 .  levETIRAcetam (KEPPRA) tablet 1,000 mg, 1,000 mg, Oral, BID, Judith Part, MD, 1,000 mg at 12/12/18 2219 .  MEDLINE mouth rinse, 15 mL, Mouth Rinse, BID, Ostergard, Joyice Faster, MD, 15 mL at 12/12/18 2247 .  ondansetron (ZOFRAN) tablet 4 mg, 4 mg, Oral, Q4H PRN **OR** ondansetron (ZOFRAN) injection 4 mg, 4 mg, Intravenous, Q4H PRN, Ostergard, Thomas A, MD .  oxyCODONE (Oxy IR/ROXICODONE) immediate release tablet 10 mg, 10 mg, Oral, Q4H PRN, Judith Part, MD, 10 mg at 12/13/18 0544 .  oxyCODONE (Oxy IR/ROXICODONE) immediate release tablet 5 mg, 5 mg, Oral, Q4H PRN, Judith Part, MD .  pantoprazole (PROTONIX) EC tablet  40 mg, 40 mg, Oral, Daily, Ostergard, Thomas A, MD .  polyethylene glycol (MIRALAX / GLYCOLAX) packet 17 g, 17 g, Oral, Daily PRN, Ostergard, Thomas A, MD .  promethazine (PHENERGAN) tablet 12.5-25 mg, 12.5-25 mg, Oral, Q4H PRN, Judith Part, MD   Physical Exam: AOx3, PERRL, EOMI, FS, Strength 5/5 x4, SILTx4, no drift Incision c/d/i  Assessment & Plan: 73 y.o. woman s/p resection of left frontal meningioma, recovering well. -MRI reviewed, shows gross total resection without complicating feature -f/u path -PT/OT -likely discharge tomorrow -SCDs/TEDs, hold SQH until [place]  12/13/18 8:22 AM

## 2018-12-13 NOTE — Evaluation (Signed)
Physical Therapy Evaluation Patient Details Name: Alexis Edwards MRN: [id number] DOB: 10-18-1946 Today's Date: 12/13/2018   History of Present Illness  This 73 y.o. female admitted for resection of Lt frontal meningioma.   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  s/p THN, s/p reverse TSA.  Clinical Impression  Pt is min guard assist overall for mobility and gait around the unit with RW. She would benefit from OP follow up at discharge for balance, gait, and continued cognitive training.   PT to follow acutely for deficits listed below.  Plan to practice stairs prior to d/c tomorrow.   Follow Up Recommendations Outpatient PT;Supervision for mobility/OOB    Equipment Recommendations  None recommended by PT    Recommendations for Other Services   NA    Precautions / Restrictions Precautions Precautions: Fall      Mobility  Bed Mobility Overal bed mobility: Needs Assistance Bed Mobility: Supine to Sit     Supine to sit: HOB elevated;Min guard     General bed mobility comments: Increased time to get out of compliant bed and min guard assist to support trunk to ensure no posterior LOB.   Transfers Overall transfer level: Needs assistance Equipment used: Rolling walker (2 wheeled) Transfers: Sit to/from Stand Sit to Stand: Min guard         General transfer comment: verbal cues for safe hand placement during transitions.   Ambulation/Gait Ambulation/Gait assistance: Min guard Gait Distance (Feet): 200 Feet Assistive device: Rolling walker (2 wheeled) Gait Pattern/deviations: Step-through pattern;Trunk flexed Gait velocity: decreased Gait velocity interpretation: 1.31 - 2.62 ft/sec, indicative of limited community ambulator General Gait Details: Pt with flexed trunk, frequent cues for upright posture and closer proximity to RW during gait.  Pt able to make the  loop around the ICU and report her room number and find her way back to her room at the end of the session.        Balance Overall balance assessment: Needs assistance Sitting-balance support: Feet supported;Bilateral upper extremity supported;No upper extremity supported Sitting balance-Leahy Scale: Good     Standing balance support: Bilateral upper extremity supported;No upper extremity supported;Single extremity supported Standing balance-Leahy Scale: Fair                               Pertinent Vitals/Pain Pain Assessment: No/denies pain    Home Living Family/patient expects to be discharged to:: Private residence Living Arrangements: Spouse/significant other Available Help at Discharge: Family Type of Home: House Home Access: Stairs to enter Entrance Stairs-Rails: Psychiatric nurse of Steps: 4 Home Layout: One level Home Equipment: Cane - single point;Grab bars - tub/shower;Shower seat - built in;Walker - 2 wheels;Adaptive equipment Additional Comments: Pt reports husband has "heart issues"    Prior Function Level of Independence: Needs assistance   Gait / Transfers Assistance Needed: pt reports she was ambulating with SPC and at times RW  ADL's / Homemaking Assistance Needed: spouse has been assisting her with ADLs since 9/19. she does have AE for LB ADLs   Comments: Pt reports she manages her own medications, but that family supervises her      Hand Dominance   Dominant Hand: Right    Extremity/Trunk Assessment   Upper Extremity Assessment Upper Extremity Assessment: Defer to OT evaluation    Lower Extremity Assessment Lower Extremity Assessment: Generalized  weakness    Cervical / Trunk Assessment Cervical / Trunk Assessment: Normal  Communication   Communication: No difficulties  Cognition Arousal/Alertness: Awake/alert Behavior During Therapy: WFL for tasks assessed/performed Overall Cognitive Status: Impaired/Different  from baseline Area of Impairment: Orientation;Attention;Following commands;Awareness                 Orientation Level: Disoriented to;Situation(pt reports "skull" surgery, not brain surgery) Current Attention Level: Selective;Alternating   Following Commands: Follows multi-step commands inconsistently   Awareness: Emergent   General Comments: When asked why she is here, she reports she was having seizures and had to have surgery on her "skull, not my brain".  She was unable to report the detail of her brain tumor and could not understand that she had surgery on her brain.       General Comments General comments (skin integrity, edema, etc.): VSS throughout session with no reports of lightheadedness or HA        Assessment/Plan    PT Assessment Patient needs continued PT services  PT Problem List Decreased strength;Decreased activity tolerance;Decreased mobility;Decreased balance;Decreased cognition;Decreased knowledge of use of DME;Decreased safety awareness;Decreased knowledge of precautions       PT Treatment Interventions DME instruction;Gait training;Stair training;Therapeutic activities;Functional mobility training;Therapeutic exercise;Balance training;Neuromuscular re-education;Cognitive remediation;Patient/family education    PT Goals (Current goals can be found in the Care Plan section)  Acute Rehab PT Goals Patient Stated Goal: to go home tomorrow PT Goal Formulation: With patient Time For Goal Achievement: 12/27/18 Potential to Achieve Goals: Good    Frequency Min 3X/week           AM-PAC PT "6 Clicks" Mobility  Outcome Measure Help needed turning from your back to your side while in a flat bed without using bedrails?: A Little Help needed moving from lying on your back to sitting on the side of a flat bed without using bedrails?: A Little Help needed moving to and from a bed to a chair (including a wheelchair)?: A Little Help needed standing up from a  chair using your arms (e.g., wheelchair or bedside chair)?: A Little Help needed to walk in hospital room?: A Little Help needed climbing 3-5 steps with a railing? : A Little 6 Click Score: 18    End of Session Equipment Utilized During Treatment: Gait belt Activity Tolerance: Patient tolerated treatment well Patient left: in chair;with call bell/phone within reach;with chair alarm set   PT Visit Diagnosis: Muscle weakness (generalized) (M62.81);Difficulty in walking, not elsewhere classified (R26.2)    Time: 0626-9485 PT Time Calculation (min) (ACUTE ONLY): 22 min   Charges:          Wells Guiles B. , PT, DPT  Acute Rehabilitation #[phone] pager #[phone] office   PT Evaluation $PT Eval Moderate Complexity: 1 Mod          12/13/2018, 5:06 PM

## 2018-12-13 NOTE — Evaluation (Signed)
Occupational Therapy Evaluation Patient Details Name: Alexis Edwards MRN: [id number] DOB: 04/19/1946 Today's Date: 12/13/2018    History of Present Illness This 73 y.o. female admitted for resection of Lt frontal meningioma.   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  s/p THN, s/p revers TSA,    Clinical Impression   Pt presents OT with the above diagnosis, but demonstrates the below listed deficits.  She demonstrates higher level cognitive deificts - difficulty alternating and dividing attention, and is impulsive, ? Memory deficits for new info (may be attentionally driven), as well as balance deficits.  She currently requires min guard/set up assist for UB ADLs and mod A for LB ADLs.  She has AE for use with  ADLs, but spouse has been assisting her at home.  She requires min guard assist for functional mobility.  Recommend OPOT at discharge.  All further OT needs can be addressed by OPOT, and therefore, acute OT will sign off at this time..     Follow Up Recommendations  Outpatient OT;Supervision/Assistance - 24 hour    Equipment Recommendations  None recommended by OT    Recommendations for Other Services       Precautions / Restrictions Precautions Precautions: Fall      Mobility Bed Mobility Overal bed mobility: Needs Assistance Bed Mobility: Supine to Sit     Supine to sit: HOB elevated;Min guard     General bed mobility comments: increased time and effort as well as use of grab bars   Transfers Overall transfer level: Needs assistance   Transfers: Sit to/from Stand;Stand Pivot Transfers Sit to Stand: Min guard Stand pivot transfers: Min guard       General transfer comment: verbal cues for hand placement     Balance Overall balance assessment: Needs assistance Sitting-balance support: Feet supported Sitting balance-Leahy Scale: Good     Standing  balance support: No upper extremity supported;During functional activity Standing balance-Leahy Scale: Fair                             ADL either performed or assessed with clinical judgement   ADL Overall ADL's : Needs assistance/impaired Eating/Feeding: Independent   Grooming: Wash/dry hands;Wash/dry face;Oral care;Brushing hair;Min guard;Standing   Upper Body Bathing: Set up;Sitting   Lower Body Bathing: Moderate assistance;Sit to/from stand   Upper Body Dressing : Set up;Sitting   Lower Body Dressing: Moderate assistance;Sit to/from stand Lower Body Dressing Details (indicate cue type and reason): difficult accessing feet.  Able to don Lt sock, but not Rt  Toilet Transfer: Min guard;Ambulation;Comfort height toilet;RW;Grab bars   Toileting- Clothing Manipulation and Hygiene: Moderate assistance;Sit to/from stand Toileting - Clothing Manipulation Details (indicate cue type and reason): unable to perform peri care due to habitus.  Pt reports she uses toileting aid at home      Functional mobility during ADLs: Min guard;Rolling walker General ADL Comments: Pt reports spouse will continue to assist her as needed at discharge, spouse confirms, but she is hopeful she will regain her independence      Vision Baseline Vision/History: Wears glasses Wears Glasses: Reading only Patient Visual Report: No change from baseline Vision Assessment?: Yes Eye Alignment: Within Functional Limits Ocular Range of Motion: Within Functional Limits Alignment/Gaze Preference: Within Defined Limits Tracking/Visual Pursuits: Able to track stimulus in all quads without difficulty Saccades: Within functional limits  Convergence: Within functional limits Additional Comments: Pt able to locate items in room and in hallway      Perception Perception Perception Tested?: Yes   Praxis Praxis Praxis tested?: Within functional limits    Pertinent Vitals/Pain Pain Assessment: No/denies pain      Hand Dominance Right   Extremity/Trunk Assessment Upper Extremity Assessment Upper Extremity Assessment: LUE deficits/detail LUE Deficits / Details: Lt shoulder 4/5; elbow distally 4+/5   Lower Extremity Assessment Lower Extremity Assessment: Defer to PT evaluation   Cervical / Trunk Assessment Cervical / Trunk Assessment: Normal   Communication Communication Communication: No difficulties   Cognition Arousal/Alertness: Awake/alert Behavior During Therapy: WFL for tasks assessed/performed;Impulsive Overall Cognitive Status: Impaired/Different from baseline Area of Impairment: Attention;Following commands                   Current Attention Level: Selective;Alternating   Following Commands: Follows multi-step commands inconsistently       General Comments: Pt is impulsive and frequently requires instructions to be repeated    General Comments  02 sats >91% on RA.  Family present during OT eval     Exercises     Shoulder Instructions      Home Living Family/patient expects to be discharged to:: Private residence Living Arrangements: Spouse/significant other Available Help at Discharge: Family Type of Home: House Home Access: Stairs to enter Technical brewer of Steps: 4 Entrance Stairs-Rails: Right;Left Home Layout: One level     Bathroom Shower/Tub: Occupational psychologist: Handicapped height Bathroom Accessibility: Yes How Accessible: Accessible via walker Raymond - single point;Grab bars - tub/shower;Shower seat - built in;Walker - 2 wheels;Adaptive equipment Adaptive Equipment: Reacher;Sock aid;Long-handled shoe horn;Long-handled sponge;Other (Comment)(toileting aid )        Prior Functioning/Environment Level of Independence: Needs assistance  Gait / Transfers Assistance Needed: pt reports she was ambulating with SPC  ADL's / Homemaking Assistance Needed: spouse has been assisting her with ADLs since 9/19. she  does have AE for LB ADLs    Comments: Pt reports she manages her own medications, but that family supervises her         OT Problem List: Decreased strength;Decreased activity tolerance;Impaired balance (sitting and/or standing);Decreased cognition;Decreased safety awareness;Obesity      OT Treatment/Interventions:      OT Goals(Current goals can be found in the care plan section) Acute Rehab OT Goals Patient Stated Goal: "I definitely want to get my independence back "  OT Goal Formulation: All assessment and education complete, DC therapy  OT Frequency:     Barriers to D/C:            Co-evaluation              AM-PAC OT "6 Clicks" Daily Activity     Outcome Measure Help from another person eating meals?: None Help from another person taking care of personal grooming?: A Little Help from another person toileting, which includes using toliet, bedpan, or urinal?: A Lot Help from another person bathing (including washing, rinsing, drying)?: A Lot Help from another person to put on and taking off regular upper body clothing?: A Little Help from another person to put on and taking off regular lower body clothing?: A Lot 6 Click Score: 16   End of Session Equipment Utilized During Treatment: Gait belt;Rolling walker Nurse Communication: Mobility status  Activity Tolerance: Patient tolerated treatment well Patient left: in chair;with call bell/phone within reach;with chair alarm set;with family/visitor present  OT Visit  Diagnosis: Unsteadiness on feet (R26.81);Cognitive communication deficit (R41.841)                Time: 4862-8241 OT Time Calculation (min): 36 min Charges:  OT General Charges $OT Visit: 1 Visit OT Evaluation $OT Eval Moderate Complexity: 1 Mod OT Treatments $Self Care/Home Management : 8-22 mins  Lucille Passy, OTR/L Acute Rehabilitation Services Pager [phone] Office [phone]   Lucille Passy M 12/13/2018, 11:39 AM

## 2018-12-14 MED ORDER — FAMOTIDINE 20 MG PO TABS
20.0000 mg | ORAL_TABLET | Freq: Once | ORAL | Status: AC
  Administered 2018-12-14: 20 mg via ORAL
  Filled 2018-12-14: qty 1

## 2018-12-14 NOTE — Discharge Instructions (Signed)
Discharge Instructions  No restriction in activities, slowly increase your activity back to normal.   Your incision is closed with sutures. These will be removed at your follow up appointment in 2 weeks. You also have two staples on the left side of your scalp that will also be removed at your 2 week visit. If they become bothersome or cause discomfort, apply some antibiotic ointment like bacitracin or neosporin on the sutures. This will soften them up and usually makes them more comfortable while they dissolve.  Okay to shower on the day of discharge. Be gentle when cleaning your incision. Use regular soap and water. If that is uncomfortable, try using baby shampoo. Do not submerge the wound under water for 2 weeks after surgery.  Follow up with Dr. Zada Finders in 2 weeks after discharge. If you do not already have a discharge appointment, please call his office at [phone] to schedule a follow up appointment. If you have any concerns or questions, please call the office and let us know.

## 2018-12-14 NOTE — Progress Notes (Signed)
Discharge instructions reviewed with pt, pt's husband, Jenny Reichmann, and daughter present. All questions answered. F/u appt to be scheduled with Dr. Zada Finders within the next 2 weeks. Pt VSS and PIV removed. Pt has no complaints at this time. Pt waiting for ride to be [place] home shortly.

## 2018-12-14 NOTE — Progress Notes (Signed)
Neurosurgery Service Progress Note  Subjective: No acute events overnight, no new weakness / numbness / parasthesias, no new complaints  Objective: Vitals:   12/14/18 0500 12/14/18 0600 12/14/18 0700 12/14/18 0800  BP: 115/72 (!) 119/56 (!) 119/59 129/69  Pulse: 74 73 69 72  Resp: 18 17 17 18   Temp:   97.7 F (36.5 C)   TempSrc:   Oral   SpO2: 94% 94% 96% (!) 87%  Weight:      Height:       Temp (24hrs), Avg:97.9 F (36.6 C), Min:97.5 F (36.4 C), Max:98.9 F (37.2 C)  CBC Latest Ref Rng & Units 12/12/2018 12/12/2018 12/11/2018  WBC 4.0 - 10.5 K/uL 9.2 - 8.2  Hemoglobin 12.0 - 15.0 g/dL 15.6(H) 12.9 16.0(H)  Hematocrit 36.0 - 46.0 % 46.8(H) 38.0 49.0(H)  Platelets 150 - 400 K/uL 228 - 212   BMP Latest Ref Rng & Units 12/12/2018 12/11/2018 12/08/2018  Glucose 70 - 99 mg/dL - 120(H) -  BUN 8 - 23 mg/dL - 17 14  Creatinine 0.44 - 1.00 mg/dL 0.88 1.01(H) 0.97  Sodium 135 - 145 mmol/L 142 143 -  Potassium 3.5 - 5.1 mmol/L 3.7 3.8 -  Chloride 98 - 111 mmol/L - 109 -  CO2 22 - 32 mmol/L - 25 -  Calcium 8.9 - 10.3 mg/dL - 9.3 -    Intake/Output Summary (Last 24 hours) at 12/14/2018 0826 Last data filed at 12/14/2018 0800 Gross per 24 hour  Intake 282.56 ml  Output 950 ml  Net -667.44 ml    Current Facility-Administered Medications:  .  0.9 %  sodium chloride infusion (Manually program via Guardrails IV Fluids), , Intravenous, Once, Kathryne Hitch, CRNA .  0.9 %  sodium chloride infusion, , Intravenous, Continuous, Ellender, Karyl Kinnier, MD, Stopped at 12/13/18 1045 .  acetaminophen (TYLENOL) tablet 650 mg, 650 mg, Oral, Q4H PRN, 650 mg at 12/13/18 1041 **OR** acetaminophen (TYLENOL) suppository 650 mg, 650 mg, Rectal, Q4H PRN, Judith Part, MD .  darifenacin (ENABLEX) 24 hr tablet 15 mg, 15 mg, Oral, Daily, Ostergard, Joyice Faster, MD, 15 mg at 12/13/18 1217 .  docusate sodium (COLACE) capsule 100 mg, 100 mg, Oral, BID, Judith Part, MD, 100 mg at 12/13/18 2142 .   DULoxetine (CYMBALTA) DR capsule 60 mg, 60 mg, Oral, Daily, Ostergard, Thomas A, MD, 60 mg at 12/13/18 1041 .  famotidine (PEPCID) IVPB 20 mg premix, 20 mg, Intravenous, Q12H, Ostergard, Joyice Faster, MD, Last Rate: 100 mL/hr at 12/13/18 2146, 20 mg at 12/13/18 2146 .  heparin injection 5,000 Units, 5,000 Units, Subcutaneous, Q8H, Judith Part, MD, 5,000 Units at 12/14/18 0516 .  HYDROmorphone (DILAUDID) injection 0.5 mg, 0.5 mg, Intravenous, Q3H PRN, Judith Part, MD, 0.5 mg at 12/13/18 0214 .  labetalol (NORMODYNE,TRANDATE) injection 10-40 mg, 10-40 mg, Intravenous, Q10 min PRN, Judith Part, MD, 20 mg at 12/12/18 2218 .  levETIRAcetam (KEPPRA) tablet 1,000 mg, 1,000 mg, Oral, BID, Judith Part, MD, 1,000 mg at 12/13/18 2142 .  ondansetron (ZOFRAN) tablet 4 mg, 4 mg, Oral, Q4H PRN **OR** ondansetron (ZOFRAN) injection 4 mg, 4 mg, Intravenous, Q4H PRN, Ostergard, Thomas A, MD .  oxyCODONE (Oxy IR/ROXICODONE) immediate release tablet 10 mg, 10 mg, Oral, Q4H PRN, Judith Part, MD, 10 mg at 12/14/18 0330 .  oxyCODONE (Oxy IR/ROXICODONE) immediate release tablet 5 mg, 5 mg, Oral, Q4H PRN, Ostergard, Thomas A, MD .  pantoprazole (PROTONIX) EC tablet 40 mg, 40 mg, Oral,  Daily, Judith Part, MD, 40 mg at 12/13/18 1613 .  polyethylene glycol (MIRALAX / GLYCOLAX) packet 17 g, 17 g, Oral, Daily PRN, Ostergard, Thomas A, MD .  promethazine (PHENERGAN) tablet 12.5-25 mg, 12.5-25 mg, Oral, Q4H PRN, Judith Part, MD   Physical Exam: AOx3, PERRL, EOMI, FS, Strength 5/5 x4, SILTx4, no drift Incision c/d/i  Assessment & Plan: 73 y.o. woman s/p resection of left frontal meningioma, recovering well. MRI w/ GTR -PT/OT rec outpt PT -discharge home today -SCDs/TEDs  Judith Part  12/14/18 8:26 AM

## 2018-12-14 NOTE — Discharge Summary (Signed)
Discharge Summary  Date of Admission: 12/12/2018  Date of Discharge: 12/14/18  Attending Physician: Emelda Brothers, MD  Hospital Course: Patient was admitted following an uncomplicated craniotomy for resection of a meningioma. She was recovered in PACU and transferred to the neuro ICU. Her hospital course was uncomplicated and the patient was discharged home on 12/14/2018. A post-op MRI showed gross total resection of the lesion without any complicating features, pathology results still pending at the time of discharge. She will follow up in clinic with me in 2 weeks.  Neurologic exam at discharge:  AOx3, PERRL, EOMI, FS, TM Strength 5/5 x4, SILTx4, no drift  Discharge diagnosis: Meningioma  Judith Part, MD 12/14/18 8:29 AM

## 2018-12-14 NOTE — Plan of Care (Signed)
Pt ambulated in room, minimal assistance required. Currently sitting in chair eating breakfast. Neurosurgeon has rounded and discussed f/u appt. Pt patiently waiting for family to come up to the unit.

## 2018-12-15 LAB — TYPE AND SCREEN
ABO/RH(D): O POS
Antibody Screen: NEGATIVE
Unit division: 0
Unit division: 0

## 2018-12-15 LAB — BPAM RBC
Blood Product Expiration Date: 202003192359
Blood Product Expiration Date: 202003192359
Unit Type and Rh: 5100
Unit Type and Rh: 5100

## 2018-12-20 ENCOUNTER — Other Ambulatory Visit: Payer: Self-pay | Admitting: Radiation Therapy

## 2018-12-27 DIAGNOSIS — H25813 Combined forms of age-related cataract, bilateral: Secondary | ICD-10-CM | POA: Diagnosis not present

## 2019-01-04 DIAGNOSIS — F419 Anxiety disorder, unspecified: Secondary | ICD-10-CM | POA: Diagnosis not present

## 2019-01-04 DIAGNOSIS — F330 Major depressive disorder, recurrent, mild: Secondary | ICD-10-CM | POA: Diagnosis not present

## 2019-01-04 DIAGNOSIS — D320 Benign neoplasm of cerebral meninges: Secondary | ICD-10-CM | POA: Diagnosis not present

## 2019-01-04 DIAGNOSIS — R569 Unspecified convulsions: Secondary | ICD-10-CM | POA: Diagnosis not present

## 2019-02-06 DIAGNOSIS — D320 Benign neoplasm of cerebral meninges: Secondary | ICD-10-CM | POA: Diagnosis not present

## 2019-02-06 DIAGNOSIS — F330 Major depressive disorder, recurrent, mild: Secondary | ICD-10-CM | POA: Diagnosis not present

## 2019-02-06 DIAGNOSIS — F419 Anxiety disorder, unspecified: Secondary | ICD-10-CM | POA: Diagnosis not present

## 2019-02-06 DIAGNOSIS — R569 Unspecified convulsions: Secondary | ICD-10-CM | POA: Diagnosis not present

## 2019-03-04 DIAGNOSIS — D320 Benign neoplasm of cerebral meninges: Secondary | ICD-10-CM | POA: Diagnosis not present

## 2019-03-04 DIAGNOSIS — K432 Incisional hernia without obstruction or gangrene: Secondary | ICD-10-CM | POA: Diagnosis not present

## 2019-03-04 DIAGNOSIS — F330 Major depressive disorder, recurrent, mild: Secondary | ICD-10-CM | POA: Diagnosis not present

## 2019-03-04 DIAGNOSIS — F419 Anxiety disorder, unspecified: Secondary | ICD-10-CM | POA: Diagnosis not present

## 2019-04-25 DIAGNOSIS — K432 Incisional hernia without obstruction or gangrene: Secondary | ICD-10-CM | POA: Diagnosis not present

## 2019-05-02 DIAGNOSIS — F419 Anxiety disorder, unspecified: Secondary | ICD-10-CM | POA: Diagnosis not present

## 2019-05-21 DIAGNOSIS — N200 Calculus of kidney: Secondary | ICD-10-CM | POA: Diagnosis not present

## 2019-05-21 DIAGNOSIS — C672 Malignant neoplasm of lateral wall of bladder: Secondary | ICD-10-CM | POA: Diagnosis not present

## 2019-05-30 DIAGNOSIS — Z9181 History of falling: Secondary | ICD-10-CM | POA: Diagnosis not present

## 2019-05-30 DIAGNOSIS — Z0000 Encounter for general adult medical examination without abnormal findings: Secondary | ICD-10-CM | POA: Diagnosis not present

## 2019-05-30 DIAGNOSIS — E785 Hyperlipidemia, unspecified: Secondary | ICD-10-CM | POA: Diagnosis not present

## 2019-05-30 DIAGNOSIS — N959 Unspecified menopausal and perimenopausal disorder: Secondary | ICD-10-CM | POA: Diagnosis not present

## 2019-05-30 DIAGNOSIS — Z1211 Encounter for screening for malignant neoplasm of colon: Secondary | ICD-10-CM | POA: Diagnosis not present

## 2019-05-30 DIAGNOSIS — Z6841 Body Mass Index (BMI) 40.0 and over, adult: Secondary | ICD-10-CM | POA: Diagnosis not present

## 2019-05-30 DIAGNOSIS — Z1331 Encounter for screening for depression: Secondary | ICD-10-CM | POA: Diagnosis not present

## 2019-05-30 DIAGNOSIS — Z1231 Encounter for screening mammogram for malignant neoplasm of breast: Secondary | ICD-10-CM | POA: Diagnosis not present

## 2019-06-05 DIAGNOSIS — K432 Incisional hernia without obstruction or gangrene: Secondary | ICD-10-CM | POA: Diagnosis not present

## 2019-06-10 ENCOUNTER — Other Ambulatory Visit: Payer: Self-pay | Admitting: Nurse Practitioner

## 2019-06-10 ENCOUNTER — Other Ambulatory Visit: Payer: Self-pay | Admitting: Family Medicine

## 2019-06-10 DIAGNOSIS — R5381 Other malaise: Secondary | ICD-10-CM

## 2019-06-10 DIAGNOSIS — Z1231 Encounter for screening mammogram for malignant neoplasm of breast: Secondary | ICD-10-CM

## 2019-06-12 IMAGING — MR MR HEAD WO/W CM
10 of 13 series · 25 of 48 positions shown · IV contrast (10 gadavist)
Comparison: Head CT from yesterday

CLINICAL DATA: Brain mass.

EXAM:
MRI HEAD WITHOUT AND WITH CONTRAST
TECHNIQUE: Multiplanar, multiecho pulse sequences of the brain and surrounding
structures were obtained without and with intravenous contrast.
CONTRAST:  10 cc Gadavist intravenous

[Series 2: FLAIR · sagittal · 3.0mm · 0.47mm/px · 1 of 40 slices shown (1 of 4)]
[im 1/40]
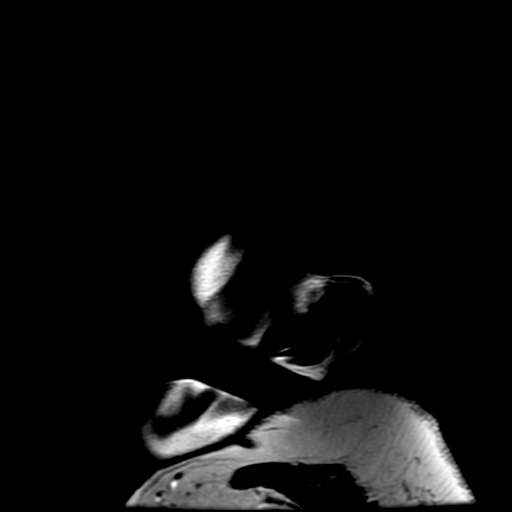

[Series 4: DWI · axial · 3.0mm · 0.94mm/px · z∈[-39,+98]mm · 5 of 100 slices shown]
[im 1/100]
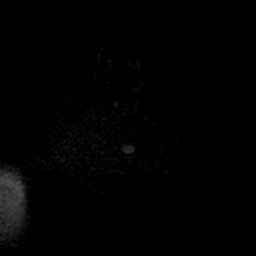
[im 25/100]
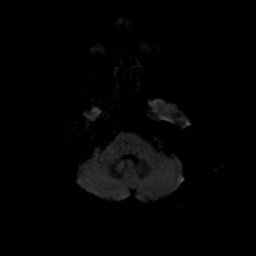
[im 50/100]
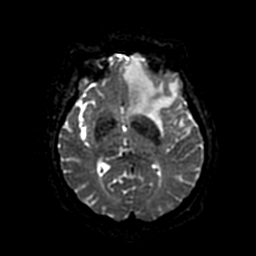
[im 75/100]
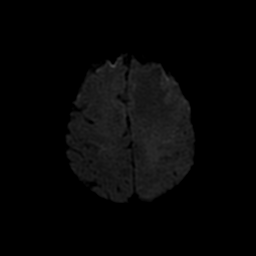
[im 100/100]
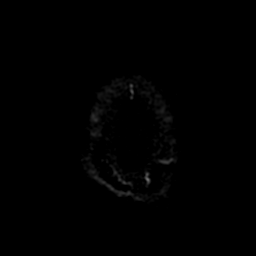

[Series 7: T2 · axial · 5.0mm · 0.47mm/px · z∈[-54,+103]mm · 2 of 29 slices shown (1 of 2)]
[im 1/29]
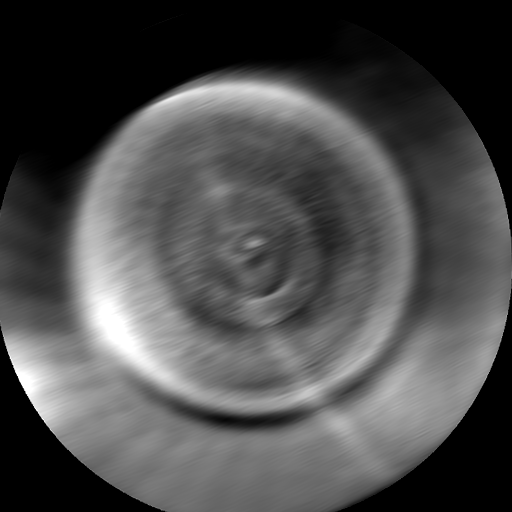
[im 29/29]
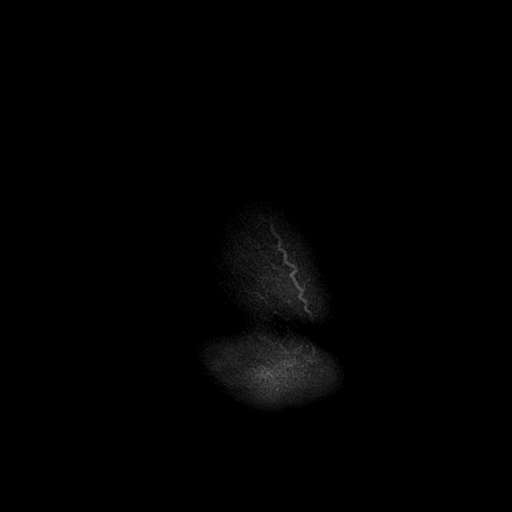

[Series 8: FLAIR · axial · 3.0mm · 0.41mm/px · z∈[-90,+84]mm · 3 of 59 slices shown (2 of 4)]
[im 1/59]
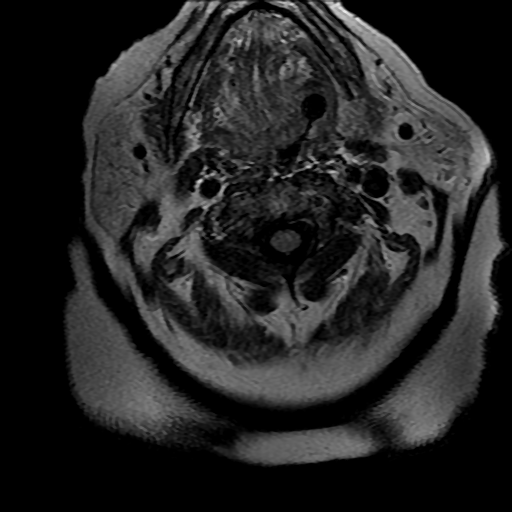
[im 30/59]
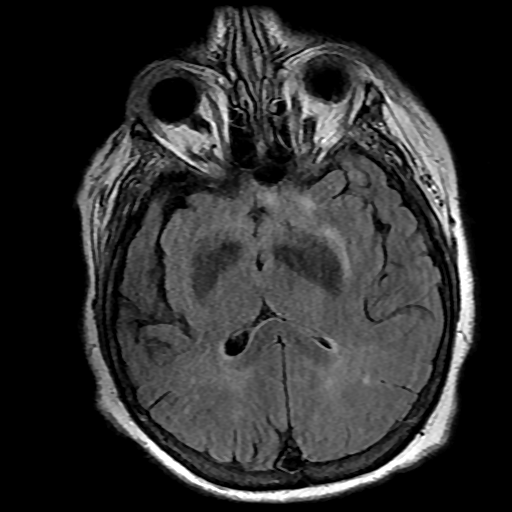
[im 59/59]
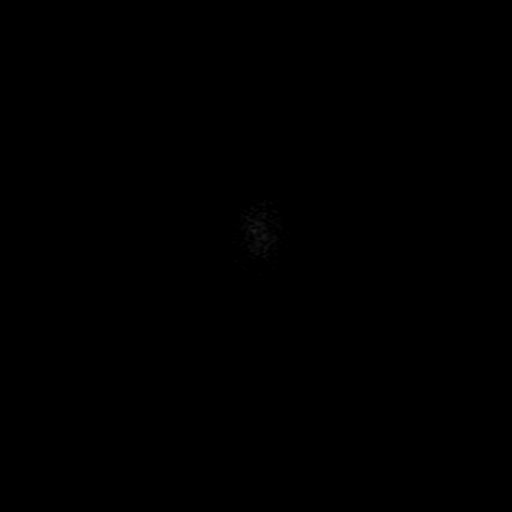

[Series 9: GRE · axial · 5.0mm · 0.47mm/px · z∈[-54,+103]mm · 2 of 29 slices shown]
[im 1/29]
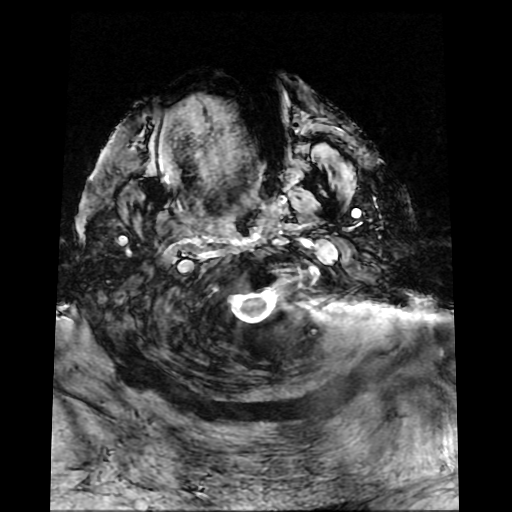
[im 29/29]
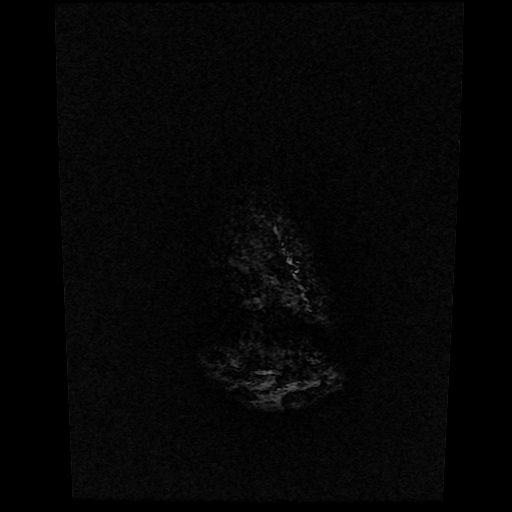

[Series 12: FLAIR · coronal · 3.0mm · 0.35mm/px · 2 of 35 slices shown (3 of 4)]
[im 1/35]
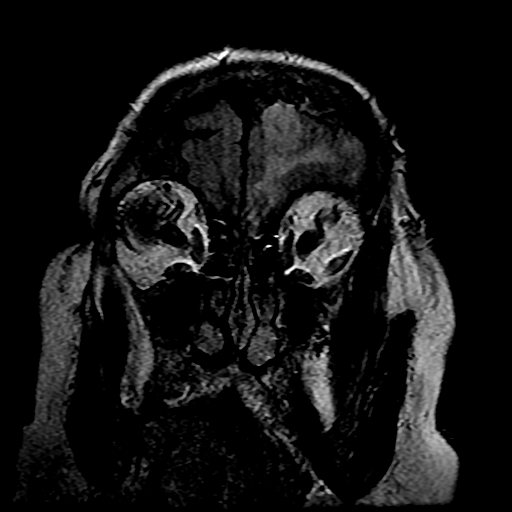
[im 35/35]
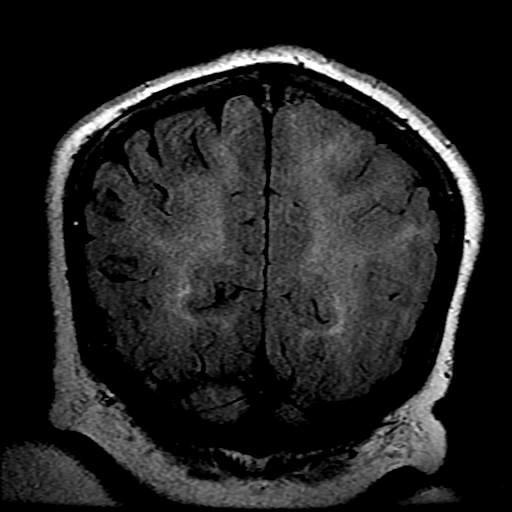

[Series 13: T2 · coronal · 3.0mm · 0.35mm/px · 2 of 35 slices shown (2 of 2)]
[im 1/35]
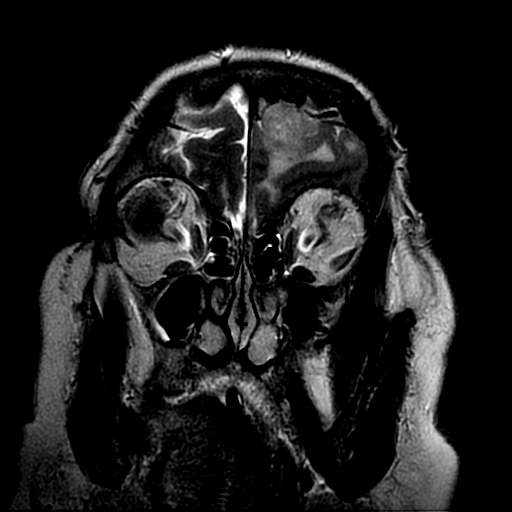
[im 35/35]
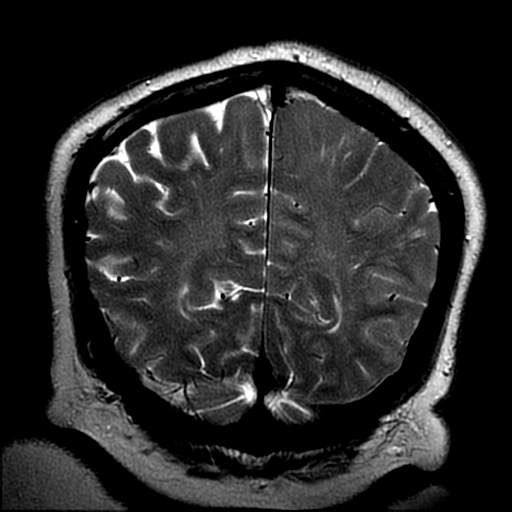

[Series 17: FLAIR · sagittal · 3.0mm · 0.47mm/px · 2 of 40 slices shown (4 of 4)]
[im 1/40]
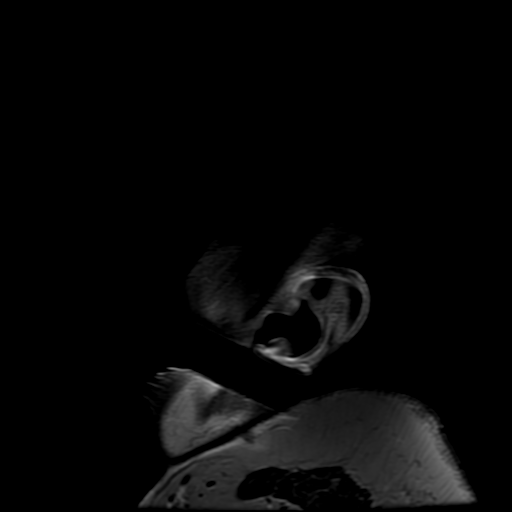
[im 40/40]
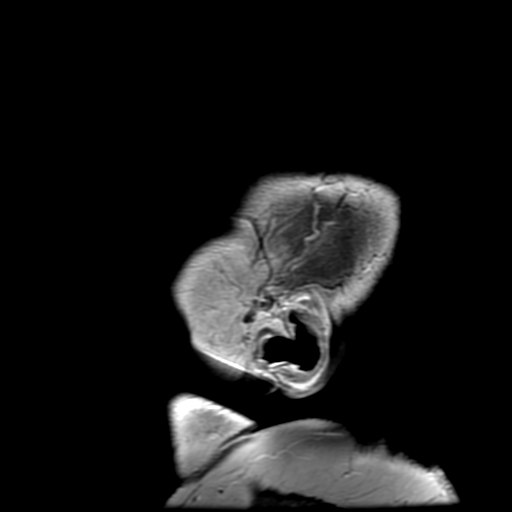

[Series 18: T2 post-contrast · coronal · 3.0mm · 0.39mm/px · 3 of 62 slices shown]
[im 1/62]
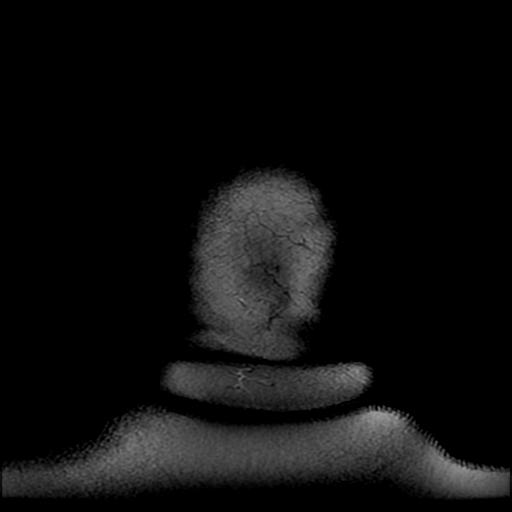
[im 31/62]
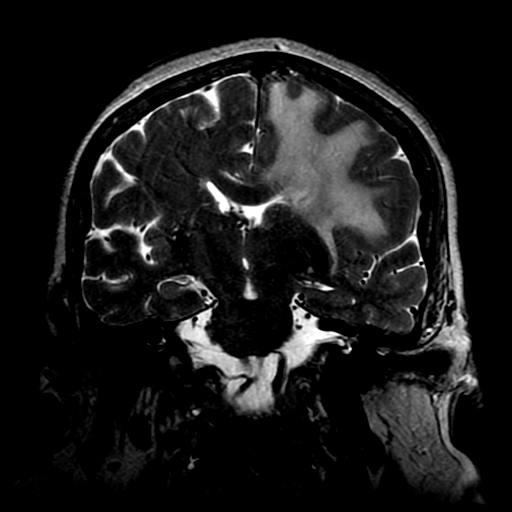
[im 62/62]
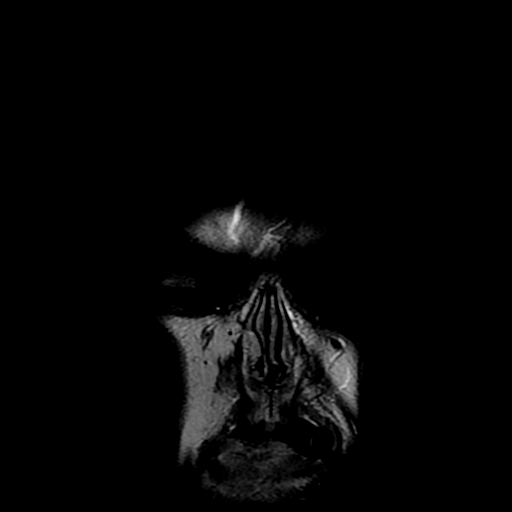

[Series 450: ADC · axial · 3.0mm · 0.94mm/px · z∈[-39,+98]mm · 3 of 50 slices shown]
[im 1/50]
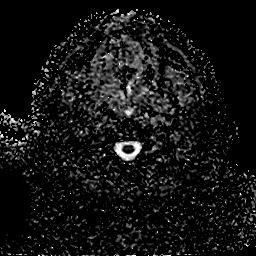
[im 25/50]
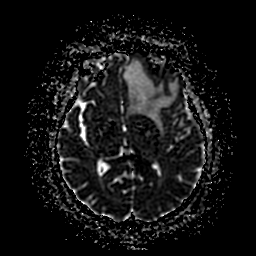
[im 50/50]
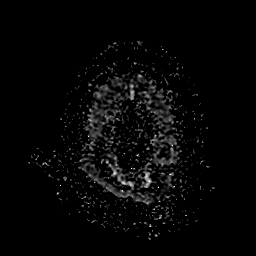

[25 of 48 positions shown; findings below may reference images not displayed]

FINDINGS: Brain: 28 x 32 x 24 mm avidly enhancing mass along the high and
anterior left frontal convexity. This is favored to be extra-axial
and this is primarily based on apparent claw sign with cortex
buckled anterior and posterior to the mass on sagittal T1 weighted
imaging. Deep to the mass a cortical rim or CSF cleft is not
visualized but this may be related to compression. There is
suggestion of a dural tail on postcontrast imaging. The mass has
central vessels and is primarily isointense to cortex on T2 weighted
imaging. No second mass is seen. Vasogenic edema is with midline
shift and subfalcine herniation. No entrapment. No ACA infarct.

Vascular: Major flow voids and vascular enhancements are preserved.

Skull and upper cervical spine: No evidence of marrow lesion

Sinuses/Orbits: Negative
IMPRESSION: 1. 3.2 cm solitary left frontal mass favored extra-axial and from
meningioma.
2. Severe vasogenic edema with subfalcine herniation.

## 2019-06-24 DIAGNOSIS — M1990 Unspecified osteoarthritis, unspecified site: Secondary | ICD-10-CM | POA: Diagnosis not present

## 2019-06-24 DIAGNOSIS — J449 Chronic obstructive pulmonary disease, unspecified: Secondary | ICD-10-CM | POA: Diagnosis not present

## 2019-06-24 DIAGNOSIS — G4733 Obstructive sleep apnea (adult) (pediatric): Secondary | ICD-10-CM | POA: Diagnosis not present

## 2019-06-24 DIAGNOSIS — Z79891 Long term (current) use of opiate analgesic: Secondary | ICD-10-CM | POA: Diagnosis not present

## 2019-06-24 DIAGNOSIS — K660 Peritoneal adhesions (postprocedural) (postinfection): Secondary | ICD-10-CM | POA: Diagnosis not present

## 2019-06-24 DIAGNOSIS — E785 Hyperlipidemia, unspecified: Secondary | ICD-10-CM | POA: Diagnosis not present

## 2019-06-24 DIAGNOSIS — K432 Incisional hernia without obstruction or gangrene: Secondary | ICD-10-CM | POA: Diagnosis not present

## 2019-06-24 DIAGNOSIS — Z9989 Dependence on other enabling machines and devices: Secondary | ICD-10-CM | POA: Diagnosis not present

## 2019-06-24 DIAGNOSIS — I252 Old myocardial infarction: Secondary | ICD-10-CM | POA: Diagnosis not present

## 2019-06-24 DIAGNOSIS — I10 Essential (primary) hypertension: Secondary | ICD-10-CM | POA: Diagnosis not present

## 2019-06-24 DIAGNOSIS — Z79899 Other long term (current) drug therapy: Secondary | ICD-10-CM | POA: Diagnosis not present

## 2019-06-24 DIAGNOSIS — Z8679 Personal history of other diseases of the circulatory system: Secondary | ICD-10-CM | POA: Diagnosis not present

## 2019-06-24 DIAGNOSIS — Z87891 Personal history of nicotine dependence: Secondary | ICD-10-CM | POA: Diagnosis not present

## 2019-06-28 ENCOUNTER — Other Ambulatory Visit: Payer: Self-pay | Admitting: Nurse Practitioner

## 2019-06-28 DIAGNOSIS — M8580 Other specified disorders of bone density and structure, unspecified site: Secondary | ICD-10-CM

## 2019-07-09 DIAGNOSIS — Z09 Encounter for follow-up examination after completed treatment for conditions other than malignant neoplasm: Secondary | ICD-10-CM | POA: Diagnosis not present

## 2019-07-10 DIAGNOSIS — M5416 Radiculopathy, lumbar region: Secondary | ICD-10-CM | POA: Diagnosis not present

## 2019-07-10 DIAGNOSIS — Z79899 Other long term (current) drug therapy: Secondary | ICD-10-CM | POA: Diagnosis not present

## 2019-07-15 DIAGNOSIS — R569 Unspecified convulsions: Secondary | ICD-10-CM | POA: Diagnosis not present

## 2019-07-15 DIAGNOSIS — F419 Anxiety disorder, unspecified: Secondary | ICD-10-CM | POA: Diagnosis not present

## 2019-07-15 DIAGNOSIS — D320 Benign neoplasm of cerebral meninges: Secondary | ICD-10-CM | POA: Diagnosis not present

## 2019-07-15 DIAGNOSIS — Z79899 Other long term (current) drug therapy: Secondary | ICD-10-CM | POA: Diagnosis not present

## 2019-07-15 DIAGNOSIS — F330 Major depressive disorder, recurrent, mild: Secondary | ICD-10-CM | POA: Diagnosis not present

## 2019-07-16 ENCOUNTER — Other Ambulatory Visit: Payer: Self-pay

## 2019-07-17 ENCOUNTER — Other Ambulatory Visit: Payer: Self-pay

## 2019-07-17 ENCOUNTER — Ambulatory Visit
Admission: RE | Admit: 2019-07-17 | Discharge: 2019-07-17 | Disposition: A | Discharge status: Home / Self Care | Payer: Medicare Other | Source: Ambulatory Visit | Attending: Family Medicine | Admitting: Family Medicine

## 2019-07-17 DIAGNOSIS — Z1231 Encounter for screening mammogram for malignant neoplasm of breast: Secondary | ICD-10-CM | POA: Diagnosis not present

## 2019-08-09 DIAGNOSIS — Z23 Encounter for immunization: Secondary | ICD-10-CM | POA: Diagnosis not present

## 2019-08-09 DIAGNOSIS — R569 Unspecified convulsions: Secondary | ICD-10-CM | POA: Diagnosis not present

## 2019-08-09 DIAGNOSIS — F330 Major depressive disorder, recurrent, mild: Secondary | ICD-10-CM | POA: Diagnosis not present

## 2019-08-09 DIAGNOSIS — I509 Heart failure, unspecified: Secondary | ICD-10-CM | POA: Diagnosis not present

## 2019-09-12 DIAGNOSIS — J3489 Other specified disorders of nose and nasal sinuses: Secondary | ICD-10-CM | POA: Diagnosis not present

## 2019-09-12 DIAGNOSIS — Z20828 Contact with and (suspected) exposure to other viral communicable diseases: Secondary | ICD-10-CM | POA: Diagnosis not present

## 2019-09-24 DIAGNOSIS — Z6841 Body Mass Index (BMI) 40.0 and over, adult: Secondary | ICD-10-CM | POA: Diagnosis not present

## 2019-09-24 DIAGNOSIS — Z20828 Contact with and (suspected) exposure to other viral communicable diseases: Secondary | ICD-10-CM | POA: Diagnosis not present

## 2019-09-24 DIAGNOSIS — J3489 Other specified disorders of nose and nasal sinuses: Secondary | ICD-10-CM | POA: Diagnosis not present

## 2019-10-03 ENCOUNTER — Other Ambulatory Visit: Payer: Medicare Other

## 2019-11-18 DIAGNOSIS — M5416 Radiculopathy, lumbar region: Secondary | ICD-10-CM | POA: Diagnosis not present

## 2019-12-05 DIAGNOSIS — M5136 Other intervertebral disc degeneration, lumbar region: Secondary | ICD-10-CM | POA: Diagnosis not present

## 2019-12-13 DIAGNOSIS — S39011A Strain of muscle, fascia and tendon of abdomen, initial encounter: Secondary | ICD-10-CM | POA: Diagnosis not present

## 2019-12-25 DIAGNOSIS — N200 Calculus of kidney: Secondary | ICD-10-CM | POA: Diagnosis not present

## 2019-12-25 DIAGNOSIS — C672 Malignant neoplasm of lateral wall of bladder: Secondary | ICD-10-CM | POA: Diagnosis not present

## 2019-12-25 DIAGNOSIS — N3281 Overactive bladder: Secondary | ICD-10-CM | POA: Diagnosis not present

## 2019-12-30 ENCOUNTER — Other Ambulatory Visit (HOSPITAL_COMMUNITY): Payer: Self-pay | Admitting: Neurological Surgery

## 2019-12-30 ENCOUNTER — Other Ambulatory Visit: Payer: Self-pay | Admitting: Neurological Surgery

## 2019-12-30 DIAGNOSIS — F419 Anxiety disorder, unspecified: Secondary | ICD-10-CM | POA: Diagnosis not present

## 2019-12-30 DIAGNOSIS — N3281 Overactive bladder: Secondary | ICD-10-CM | POA: Diagnosis not present

## 2019-12-30 DIAGNOSIS — D496 Neoplasm of unspecified behavior of brain: Secondary | ICD-10-CM

## 2019-12-30 DIAGNOSIS — Z139 Encounter for screening, unspecified: Secondary | ICD-10-CM | POA: Diagnosis not present

## 2019-12-30 DIAGNOSIS — Z79899 Other long term (current) drug therapy: Secondary | ICD-10-CM | POA: Diagnosis not present

## 2019-12-30 DIAGNOSIS — K219 Gastro-esophageal reflux disease without esophagitis: Secondary | ICD-10-CM | POA: Diagnosis not present

## 2019-12-30 DIAGNOSIS — F330 Major depressive disorder, recurrent, mild: Secondary | ICD-10-CM | POA: Diagnosis not present

## 2020-01-11 ENCOUNTER — Ambulatory Visit (HOSPITAL_COMMUNITY): Payer: Medicare Other

## 2020-01-22 DIAGNOSIS — G894 Chronic pain syndrome: Secondary | ICD-10-CM | POA: Diagnosis not present

## 2020-01-22 DIAGNOSIS — M5416 Radiculopathy, lumbar region: Secondary | ICD-10-CM | POA: Diagnosis not present

## 2020-01-28 DIAGNOSIS — M2550 Pain in unspecified joint: Secondary | ICD-10-CM | POA: Diagnosis not present

## 2020-01-28 DIAGNOSIS — R61 Generalized hyperhidrosis: Secondary | ICD-10-CM | POA: Diagnosis not present

## 2020-01-28 DIAGNOSIS — M159 Polyosteoarthritis, unspecified: Secondary | ICD-10-CM | POA: Diagnosis not present

## 2020-01-28 DIAGNOSIS — F330 Major depressive disorder, recurrent, mild: Secondary | ICD-10-CM | POA: Diagnosis not present

## 2020-01-28 DIAGNOSIS — R7303 Prediabetes: Secondary | ICD-10-CM | POA: Diagnosis not present

## 2020-01-30 ENCOUNTER — Ambulatory Visit (HOSPITAL_COMMUNITY)
Admission: RE | Admit: 2020-01-30 | Discharge: 2020-01-30 | Disposition: A | Discharge status: Home / Self Care | Payer: Medicare Other | Source: Ambulatory Visit | Attending: Neurological Surgery | Admitting: Neurological Surgery

## 2020-01-30 ENCOUNTER — Other Ambulatory Visit: Payer: Self-pay

## 2020-01-30 DIAGNOSIS — D496 Neoplasm of unspecified behavior of brain: Secondary | ICD-10-CM | POA: Diagnosis not present

## 2020-01-30 DIAGNOSIS — D329 Benign neoplasm of meninges, unspecified: Secondary | ICD-10-CM | POA: Diagnosis not present

## 2020-01-30 MED ORDER — GADOBUTROL 1 MMOL/ML IV SOLN
10.0000 mL | Freq: Once | INTRAVENOUS | Status: AC | PRN
  Administered 2020-01-30: 16:00:00 10 mL via INTRAVENOUS

## 2020-02-20 DIAGNOSIS — Z6841 Body Mass Index (BMI) 40.0 and over, adult: Secondary | ICD-10-CM | POA: Diagnosis not present

## 2020-02-20 DIAGNOSIS — D496 Neoplasm of unspecified behavior of brain: Secondary | ICD-10-CM | POA: Diagnosis not present

## 2020-02-20 DIAGNOSIS — R030 Elevated blood-pressure reading, without diagnosis of hypertension: Secondary | ICD-10-CM | POA: Diagnosis not present

## 2020-02-25 DIAGNOSIS — E039 Hypothyroidism, unspecified: Secondary | ICD-10-CM | POA: Diagnosis not present

## 2020-02-25 DIAGNOSIS — I509 Heart failure, unspecified: Secondary | ICD-10-CM | POA: Diagnosis not present

## 2020-02-25 DIAGNOSIS — I5181 Takotsubo syndrome: Secondary | ICD-10-CM | POA: Diagnosis not present

## 2020-02-25 DIAGNOSIS — M8580 Other specified disorders of bone density and structure, unspecified site: Secondary | ICD-10-CM | POA: Diagnosis not present

## 2020-02-25 DIAGNOSIS — F330 Major depressive disorder, recurrent, mild: Secondary | ICD-10-CM | POA: Diagnosis not present

## 2020-02-25 DIAGNOSIS — R5383 Other fatigue: Secondary | ICD-10-CM | POA: Diagnosis not present

## 2020-02-25 DIAGNOSIS — I700 Atherosclerosis of aorta: Secondary | ICD-10-CM | POA: Diagnosis not present

## 2020-02-25 DIAGNOSIS — G4733 Obstructive sleep apnea (adult) (pediatric): Secondary | ICD-10-CM | POA: Diagnosis not present

## 2020-02-25 DIAGNOSIS — Z79899 Other long term (current) drug therapy: Secondary | ICD-10-CM | POA: Diagnosis not present

## 2020-02-25 DIAGNOSIS — C672 Malignant neoplasm of lateral wall of bladder: Secondary | ICD-10-CM | POA: Diagnosis not present

## 2020-03-05 DIAGNOSIS — H1045 Other chronic allergic conjunctivitis: Secondary | ICD-10-CM | POA: Diagnosis not present

## 2020-03-05 DIAGNOSIS — H25813 Combined forms of age-related cataract, bilateral: Secondary | ICD-10-CM | POA: Diagnosis not present

## 2020-03-25 DIAGNOSIS — H25812 Combined forms of age-related cataract, left eye: Secondary | ICD-10-CM | POA: Diagnosis not present

## 2020-05-01 DIAGNOSIS — H2511 Age-related nuclear cataract, right eye: Secondary | ICD-10-CM | POA: Diagnosis not present

## 2020-05-01 DIAGNOSIS — H25811 Combined forms of age-related cataract, right eye: Secondary | ICD-10-CM | POA: Diagnosis not present

## 2020-06-23 DIAGNOSIS — Z1231 Encounter for screening mammogram for malignant neoplasm of breast: Secondary | ICD-10-CM | POA: Diagnosis not present

## 2020-06-23 DIAGNOSIS — R1031 Right lower quadrant pain: Secondary | ICD-10-CM | POA: Diagnosis not present

## 2020-06-23 DIAGNOSIS — F419 Anxiety disorder, unspecified: Secondary | ICD-10-CM | POA: Diagnosis not present

## 2020-06-23 DIAGNOSIS — R0600 Dyspnea, unspecified: Secondary | ICD-10-CM | POA: Diagnosis not present

## 2020-06-23 DIAGNOSIS — Z6841 Body Mass Index (BMI) 40.0 and over, adult: Secondary | ICD-10-CM | POA: Diagnosis not present

## 2020-06-23 DIAGNOSIS — Z1211 Encounter for screening for malignant neoplasm of colon: Secondary | ICD-10-CM | POA: Diagnosis not present

## 2020-07-08 DIAGNOSIS — Z1212 Encounter for screening for malignant neoplasm of rectum: Secondary | ICD-10-CM | POA: Diagnosis not present

## 2020-07-08 DIAGNOSIS — Z1211 Encounter for screening for malignant neoplasm of colon: Secondary | ICD-10-CM | POA: Diagnosis not present

## 2020-07-15 LAB — COLOGUARD: COLOGUARD: NEGATIVE

## 2020-07-15 LAB — EXTERNAL GENERIC LAB PROCEDURE: COLOGUARD: NEGATIVE

## 2020-08-26 DIAGNOSIS — Z5181 Encounter for therapeutic drug level monitoring: Secondary | ICD-10-CM | POA: Diagnosis not present

## 2020-08-26 DIAGNOSIS — G894 Chronic pain syndrome: Secondary | ICD-10-CM | POA: Diagnosis not present

## 2020-08-26 DIAGNOSIS — Z79899 Other long term (current) drug therapy: Secondary | ICD-10-CM | POA: Diagnosis not present

## 2020-08-31 DIAGNOSIS — R079 Chest pain, unspecified: Secondary | ICD-10-CM | POA: Diagnosis not present

## 2020-08-31 DIAGNOSIS — R1084 Generalized abdominal pain: Secondary | ICD-10-CM | POA: Diagnosis not present

## 2020-08-31 DIAGNOSIS — R1030 Lower abdominal pain, unspecified: Secondary | ICD-10-CM | POA: Diagnosis not present

## 2020-08-31 DIAGNOSIS — R0789 Other chest pain: Secondary | ICD-10-CM | POA: Diagnosis not present

## 2020-08-31 DIAGNOSIS — R0902 Hypoxemia: Secondary | ICD-10-CM | POA: Diagnosis not present

## 2020-09-22 DIAGNOSIS — M5136 Other intervertebral disc degeneration, lumbar region: Secondary | ICD-10-CM | POA: Diagnosis not present

## 2020-10-01 ENCOUNTER — Other Ambulatory Visit: Payer: Self-pay | Admitting: Family Medicine

## 2020-10-01 DIAGNOSIS — E785 Hyperlipidemia, unspecified: Secondary | ICD-10-CM | POA: Diagnosis not present

## 2020-10-01 DIAGNOSIS — Z23 Encounter for immunization: Secondary | ICD-10-CM | POA: Diagnosis not present

## 2020-10-01 DIAGNOSIS — E038 Other specified hypothyroidism: Secondary | ICD-10-CM | POA: Diagnosis not present

## 2020-10-01 DIAGNOSIS — Z1231 Encounter for screening mammogram for malignant neoplasm of breast: Secondary | ICD-10-CM

## 2020-10-01 DIAGNOSIS — Z79899 Other long term (current) drug therapy: Secondary | ICD-10-CM | POA: Diagnosis not present

## 2020-10-01 DIAGNOSIS — G4700 Insomnia, unspecified: Secondary | ICD-10-CM | POA: Diagnosis not present

## 2020-10-01 DIAGNOSIS — G319 Degenerative disease of nervous system, unspecified: Secondary | ICD-10-CM | POA: Diagnosis not present

## 2020-10-01 DIAGNOSIS — Z9181 History of falling: Secondary | ICD-10-CM | POA: Diagnosis not present

## 2020-10-01 DIAGNOSIS — I509 Heart failure, unspecified: Secondary | ICD-10-CM | POA: Diagnosis not present

## 2020-10-01 DIAGNOSIS — N1831 Chronic kidney disease, stage 3a: Secondary | ICD-10-CM | POA: Diagnosis not present

## 2020-10-01 DIAGNOSIS — M791 Myalgia, unspecified site: Secondary | ICD-10-CM | POA: Diagnosis not present

## 2020-10-01 DIAGNOSIS — F330 Major depressive disorder, recurrent, mild: Secondary | ICD-10-CM | POA: Diagnosis not present

## 2020-10-01 DIAGNOSIS — J439 Emphysema, unspecified: Secondary | ICD-10-CM | POA: Diagnosis not present

## 2020-11-25 DIAGNOSIS — M48061 Spinal stenosis, lumbar region without neurogenic claudication: Secondary | ICD-10-CM | POA: Diagnosis not present

## 2020-11-25 DIAGNOSIS — F330 Major depressive disorder, recurrent, mild: Secondary | ICD-10-CM | POA: Diagnosis not present

## 2020-11-25 DIAGNOSIS — Z6841 Body Mass Index (BMI) 40.0 and over, adult: Secondary | ICD-10-CM | POA: Diagnosis not present

## 2020-11-25 DIAGNOSIS — F4321 Adjustment disorder with depressed mood: Secondary | ICD-10-CM | POA: Diagnosis not present

## 2020-11-25 DIAGNOSIS — G4700 Insomnia, unspecified: Secondary | ICD-10-CM | POA: Diagnosis not present

## 2020-11-25 DIAGNOSIS — G4730 Sleep apnea, unspecified: Secondary | ICD-10-CM | POA: Diagnosis not present

## 2020-11-27 ENCOUNTER — Other Ambulatory Visit: Payer: Self-pay | Admitting: Neurological Surgery

## 2020-11-27 ENCOUNTER — Other Ambulatory Visit (HOSPITAL_COMMUNITY): Payer: Self-pay | Admitting: Neurological Surgery

## 2020-11-27 DIAGNOSIS — D496 Neoplasm of unspecified behavior of brain: Secondary | ICD-10-CM

## 2020-12-16 DIAGNOSIS — M48061 Spinal stenosis, lumbar region without neurogenic claudication: Secondary | ICD-10-CM | POA: Diagnosis not present

## 2020-12-16 DIAGNOSIS — F419 Anxiety disorder, unspecified: Secondary | ICD-10-CM | POA: Diagnosis not present

## 2021-01-20 DIAGNOSIS — N3281 Overactive bladder: Secondary | ICD-10-CM | POA: Diagnosis not present

## 2021-01-20 DIAGNOSIS — Z139 Encounter for screening, unspecified: Secondary | ICD-10-CM | POA: Diagnosis not present

## 2021-01-20 DIAGNOSIS — R8290 Unspecified abnormal findings in urine: Secondary | ICD-10-CM | POA: Diagnosis not present

## 2021-02-01 ENCOUNTER — Ambulatory Visit (HOSPITAL_COMMUNITY)
Admission: RE | Admit: 2021-02-01 | Discharge: 2021-02-01 | Disposition: A | Discharge status: Home / Self Care | Payer: Medicare Other | Source: Ambulatory Visit | Attending: Neurological Surgery | Admitting: Neurological Surgery

## 2021-02-01 ENCOUNTER — Other Ambulatory Visit: Payer: Self-pay

## 2021-02-01 DIAGNOSIS — D496 Neoplasm of unspecified behavior of brain: Secondary | ICD-10-CM | POA: Diagnosis not present

## 2021-02-01 DIAGNOSIS — D329 Benign neoplasm of meninges, unspecified: Secondary | ICD-10-CM | POA: Diagnosis not present

## 2021-02-01 DIAGNOSIS — G9389 Other specified disorders of brain: Secondary | ICD-10-CM | POA: Diagnosis not present

## 2021-02-01 DIAGNOSIS — Z9889 Other specified postprocedural states: Secondary | ICD-10-CM | POA: Diagnosis not present

## 2021-02-01 MED ORDER — GADOBUTROL 1 MMOL/ML IV SOLN
10.0000 mL | Freq: Once | INTRAVENOUS | Status: AC | PRN
  Administered 2021-02-01: 10 mL via INTRAVENOUS

## 2021-02-18 DIAGNOSIS — D496 Neoplasm of unspecified behavior of brain: Secondary | ICD-10-CM | POA: Diagnosis not present

## 2021-02-18 DIAGNOSIS — Z6841 Body Mass Index (BMI) 40.0 and over, adult: Secondary | ICD-10-CM | POA: Diagnosis not present

## 2021-02-18 DIAGNOSIS — R030 Elevated blood-pressure reading, without diagnosis of hypertension: Secondary | ICD-10-CM | POA: Diagnosis not present

## 2021-04-12 DIAGNOSIS — M48061 Spinal stenosis, lumbar region without neurogenic claudication: Secondary | ICD-10-CM | POA: Diagnosis not present

## 2021-04-12 DIAGNOSIS — I5020 Unspecified systolic (congestive) heart failure: Secondary | ICD-10-CM | POA: Diagnosis not present

## 2021-04-12 DIAGNOSIS — Z6841 Body Mass Index (BMI) 40.0 and over, adult: Secondary | ICD-10-CM | POA: Diagnosis not present

## 2021-04-12 DIAGNOSIS — I700 Atherosclerosis of aorta: Secondary | ICD-10-CM | POA: Diagnosis not present

## 2021-04-12 DIAGNOSIS — N1831 Chronic kidney disease, stage 3a: Secondary | ICD-10-CM | POA: Diagnosis not present

## 2021-04-12 DIAGNOSIS — E785 Hyperlipidemia, unspecified: Secondary | ICD-10-CM | POA: Diagnosis not present

## 2021-04-12 DIAGNOSIS — G319 Degenerative disease of nervous system, unspecified: Secondary | ICD-10-CM | POA: Diagnosis not present

## 2021-04-12 DIAGNOSIS — F330 Major depressive disorder, recurrent, mild: Secondary | ICD-10-CM | POA: Diagnosis not present

## 2021-04-12 DIAGNOSIS — Z1231 Encounter for screening mammogram for malignant neoplasm of breast: Secondary | ICD-10-CM | POA: Diagnosis not present

## 2021-04-12 DIAGNOSIS — J439 Emphysema, unspecified: Secondary | ICD-10-CM | POA: Diagnosis not present

## 2021-04-12 DIAGNOSIS — N3281 Overactive bladder: Secondary | ICD-10-CM | POA: Diagnosis not present

## 2021-04-12 DIAGNOSIS — F419 Anxiety disorder, unspecified: Secondary | ICD-10-CM | POA: Diagnosis not present

## 2021-04-19 ENCOUNTER — Other Ambulatory Visit: Payer: Self-pay | Admitting: Family Medicine

## 2021-04-19 DIAGNOSIS — Z1231 Encounter for screening mammogram for malignant neoplasm of breast: Secondary | ICD-10-CM

## 2021-04-19 DIAGNOSIS — E2839 Other primary ovarian failure: Secondary | ICD-10-CM

## 2021-04-22 DIAGNOSIS — G894 Chronic pain syndrome: Secondary | ICD-10-CM | POA: Diagnosis not present

## 2021-04-22 DIAGNOSIS — M47896 Other spondylosis, lumbar region: Secondary | ICD-10-CM | POA: Diagnosis not present

## 2021-04-22 DIAGNOSIS — Z79899 Other long term (current) drug therapy: Secondary | ICD-10-CM | POA: Diagnosis not present

## 2021-04-22 DIAGNOSIS — M5136 Other intervertebral disc degeneration, lumbar region: Secondary | ICD-10-CM | POA: Diagnosis not present

## 2021-05-01 DIAGNOSIS — H04123 Dry eye syndrome of bilateral lacrimal glands: Secondary | ICD-10-CM | POA: Diagnosis not present

## 2021-06-11 DIAGNOSIS — M47816 Spondylosis without myelopathy or radiculopathy, lumbar region: Secondary | ICD-10-CM | POA: Diagnosis not present

## 2021-06-11 DIAGNOSIS — M25561 Pain in right knee: Secondary | ICD-10-CM | POA: Diagnosis not present

## 2021-06-11 DIAGNOSIS — G894 Chronic pain syndrome: Secondary | ICD-10-CM | POA: Diagnosis not present

## 2021-06-15 DIAGNOSIS — L821 Other seborrheic keratosis: Secondary | ICD-10-CM | POA: Diagnosis not present

## 2021-06-15 DIAGNOSIS — H6983 Other specified disorders of Eustachian tube, bilateral: Secondary | ICD-10-CM | POA: Diagnosis not present

## 2021-07-07 DIAGNOSIS — Z79891 Long term (current) use of opiate analgesic: Secondary | ICD-10-CM | POA: Diagnosis not present

## 2021-07-07 DIAGNOSIS — M5459 Other low back pain: Secondary | ICD-10-CM | POA: Diagnosis not present

## 2021-07-07 DIAGNOSIS — M5136 Other intervertebral disc degeneration, lumbar region: Secondary | ICD-10-CM | POA: Diagnosis not present

## 2021-07-07 DIAGNOSIS — Z5181 Encounter for therapeutic drug level monitoring: Secondary | ICD-10-CM | POA: Diagnosis not present

## 2021-07-07 DIAGNOSIS — M47816 Spondylosis without myelopathy or radiculopathy, lumbar region: Secondary | ICD-10-CM | POA: Diagnosis not present

## 2021-07-07 DIAGNOSIS — M25561 Pain in right knee: Secondary | ICD-10-CM | POA: Diagnosis not present

## 2021-07-07 DIAGNOSIS — G894 Chronic pain syndrome: Secondary | ICD-10-CM | POA: Diagnosis not present

## 2021-07-15 DIAGNOSIS — Z9181 History of falling: Secondary | ICD-10-CM | POA: Diagnosis not present

## 2021-07-15 DIAGNOSIS — Z0000 Encounter for general adult medical examination without abnormal findings: Secondary | ICD-10-CM | POA: Diagnosis not present

## 2021-07-15 DIAGNOSIS — E785 Hyperlipidemia, unspecified: Secondary | ICD-10-CM | POA: Diagnosis not present

## 2021-07-19 ENCOUNTER — Other Ambulatory Visit: Payer: Self-pay

## 2021-07-19 ENCOUNTER — Ambulatory Visit
Admission: RE | Admit: 2021-07-19 | Discharge: 2021-07-19 | Disposition: A | Discharge status: Home / Self Care | Payer: Medicare Other | Source: Ambulatory Visit | Attending: Family Medicine | Admitting: Family Medicine

## 2021-07-19 DIAGNOSIS — Z1231 Encounter for screening mammogram for malignant neoplasm of breast: Secondary | ICD-10-CM

## 2021-08-10 DIAGNOSIS — H9319 Tinnitus, unspecified ear: Secondary | ICD-10-CM | POA: Diagnosis not present

## 2021-08-10 DIAGNOSIS — H9190 Unspecified hearing loss, unspecified ear: Secondary | ICD-10-CM | POA: Diagnosis not present

## 2021-08-18 DIAGNOSIS — Z23 Encounter for immunization: Secondary | ICD-10-CM | POA: Diagnosis not present

## 2021-10-19 DIAGNOSIS — N1831 Chronic kidney disease, stage 3a: Secondary | ICD-10-CM | POA: Diagnosis not present

## 2021-10-19 DIAGNOSIS — N3281 Overactive bladder: Secondary | ICD-10-CM | POA: Diagnosis not present

## 2021-10-19 DIAGNOSIS — I5020 Unspecified systolic (congestive) heart failure: Secondary | ICD-10-CM | POA: Diagnosis not present

## 2021-10-19 DIAGNOSIS — D751 Secondary polycythemia: Secondary | ICD-10-CM | POA: Diagnosis not present

## 2021-10-19 DIAGNOSIS — E785 Hyperlipidemia, unspecified: Secondary | ICD-10-CM | POA: Diagnosis not present

## 2021-10-19 DIAGNOSIS — M48061 Spinal stenosis, lumbar region without neurogenic claudication: Secondary | ICD-10-CM | POA: Diagnosis not present

## 2021-10-28 DIAGNOSIS — M47816 Spondylosis without myelopathy or radiculopathy, lumbar region: Secondary | ICD-10-CM | POA: Diagnosis not present

## 2022-02-08 DIAGNOSIS — Z139 Encounter for screening, unspecified: Secondary | ICD-10-CM | POA: Diagnosis not present

## 2022-02-08 DIAGNOSIS — R0609 Other forms of dyspnea: Secondary | ICD-10-CM | POA: Diagnosis not present

## 2022-02-08 DIAGNOSIS — J439 Emphysema, unspecified: Secondary | ICD-10-CM | POA: Diagnosis not present

## 2022-02-08 DIAGNOSIS — I5020 Unspecified systolic (congestive) heart failure: Secondary | ICD-10-CM | POA: Diagnosis not present

## 2022-02-08 DIAGNOSIS — G319 Degenerative disease of nervous system, unspecified: Secondary | ICD-10-CM | POA: Diagnosis not present

## 2022-02-08 DIAGNOSIS — N1831 Chronic kidney disease, stage 3a: Secondary | ICD-10-CM | POA: Diagnosis not present

## 2022-02-08 DIAGNOSIS — E785 Hyperlipidemia, unspecified: Secondary | ICD-10-CM | POA: Diagnosis not present

## 2022-02-08 DIAGNOSIS — I700 Atherosclerosis of aorta: Secondary | ICD-10-CM | POA: Diagnosis not present

## 2022-02-08 DIAGNOSIS — F330 Major depressive disorder, recurrent, mild: Secondary | ICD-10-CM | POA: Diagnosis not present

## 2022-02-22 ENCOUNTER — Ambulatory Visit (INDEPENDENT_AMBULATORY_CARE_PROVIDER_SITE_OTHER): Payer: PPO | Admitting: Internal Medicine

## 2022-02-22 ENCOUNTER — Encounter: Payer: Self-pay | Admitting: Internal Medicine

## 2022-02-22 VITALS — BP 104/76 | HR 73 | Ht 59.0 in | Wt 267.8 lb

## 2022-02-22 DIAGNOSIS — R0609 Other forms of dyspnea: Secondary | ICD-10-CM

## 2022-02-22 MED ORDER — FUROSEMIDE 20 MG PO TABS
20.0000 mg | ORAL_TABLET | Freq: Every day | ORAL | 2 refills | Status: DC
Start: 2022-02-22 — End: 2022-07-08

## 2022-02-22 NOTE — Patient Instructions (Signed)
Medication Instructions:  ?START: LASIX (FUROSEMIDE) '20mg'$  ONCE DAILY  ?*If you need a refill on your cardiac medications before your next appointment, please call your pharmacy* ? ?Lab Work: ?BLOOD WORK TODAY  ?If you have labs (blood work) drawn today and your tests are completely normal, you will receive your results only by: ?MyChart Message (if you have MyChart) OR ?A paper copy in the mail ?If you have any lab test that is abnormal or we need to change your treatment, we will call you to review the results. ? ?Testing/Procedures: ?Your physician has requested that you have an echocardiogram. Echocardiography is a painless test that uses sound waves to create images of your heart. It provides your doctor with information about the size and shape of your heart and how well your heart?s chambers and valves are working. You may receive an ultrasound enhancing agent through an IV if needed to better visualize your heart during the echo.This procedure takes approximately one hour. There are no restrictions for this procedure. This will take place at the [address].  ? ?PULMONARY FUNCTION TESTS- SOMEONE WILL REACH OUT TO YOU TO GET THIS SCHEDULED  ? ?Follow-Up: ?At [facility], you and your health needs are our priority.  As part of our continuing mission to provide you with exceptional heart care, we have created designated Provider Care Teams.  These Care Teams include your primary Cardiologist (physician) and Advanced Practice Providers (APPs -  Physician Assistants and Nurse Practitioners) who all work together to provide you with the care you need, when you need it. ? ?We recommend signing up for the patient portal called "MyChart".  Sign up information is provided on this After Visit Summary.  MyChart is used to connect with patients for Virtual Visits (Telemedicine).  Patients are able to view lab/test results, encounter notes, upcoming appointments, etc.  Non-urgent messages can be sent to  your provider as well.   ?To learn more about what you can do with MyChart, go to [email or web address].   ? ?Your next appointment:   ?3 month(s) ? ?The format for your next appointment:   ?In Person ? ?Provider:   ?Janina Mayo, MD  ? ? ?

## 2022-02-22 NOTE — Progress Notes (Signed)
?Cardiology Office Note:   ? ?Date:  02/22/2022  ? ?ID:  Alexis Edwards, DOB 06-04-1946, MRN [id number] ? ?PCP:  Leonides Sake, MD ?  ?[facility] Providers ?Cardiologist:  Janina Mayo, MD    ? ?Referring MD: Leonides Sake, MD  ? ?No chief complaint on file. ?Dyspnea ? ?History of Present Illness:   ? ?Alexis Edwards is a 76 y.o. female with a hx of meningiom s/p craniotomy, COPD, CKD stage IIIA,  former smoker, severe obesity, referral for DOE ? ?Former patient of Dr. Claiborne Billings. Has hx of sleep apnea. 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  ? ?She was seen recently seen by Dr. Lisbeth Ply her PCP. She was having dyspnea on exertion with minimal activity. EKG was normal. She notes getting LH and dizzy. SOB has progressed in the last 6 months.  She has some chest pressure at rest, not with activity. No PND, orthopnea, or LE edema. Could not tolerate CPAP even the non mask alternative. Considered gastric surgery, but declined. Notes eating TV dinners mostly and being very sedentary since her husband died 2 years ago. Blood pressures are in good control. TSH mildly elevated ? ?Cardiology Studies: ?TTE 12/26/2016- Normal LV fxn. No valve dx. ?Myoview 2012- normal stress ecg. No perfusion abnormalities ? ? ?Past Medical History:  ?Diagnosis Date  ? Arthritis   ? CHF (congestive heart failure) ([place])   ? Claustrophobia   ? COPD (chronic obstructive pulmonary disease) ([place])   ? Depression   ? Dyslipidemia   ? Dyspnea   ? GERD (gastroesophageal reflux disease)   ? Heart murmur   ? History of kidney stones   ? HTN (hypertension)   ? Hyperlipidemia   ? Meningioma ([place]) 11/14/2018  ? Obesity   ? BMI 55  ? Pneumonia   ? hx  ? Sleep apnea   ? does not use cpap  ? Sleep apnea   ? Takotsubo syndrome April 2013  ? apical ballooning, [facility]- [place]  ? ? ?Past Surgical History:  ?Procedure Laterality Date  ? ABDOMINAL HYSTERECTOMY    ? APPLICATION OF CRANIAL  NAVIGATION N/A 12/12/2018  ? Procedure: APPLICATION OF CRANIAL NAVIGATION;  Surgeon: Judith Part, MD;  Location: [place];  Service: Neurosurgery;  Laterality: N/A;  ? CARDIAC CATHETERIZATION    ? CHOLECYSTECTOMY  2/10  ? CRANIOTOMY Left 12/12/2018  ? Procedure: Left Craniotomy for tumor resection with brainlab;  Surgeon: Judith Part, MD;  Location: [place];  Service: Neurosurgery;  Laterality: Left;  ? KIDNEY STONE SURGERY    ? REVERSE SHOULDER ARTHROPLASTY Left 02/24/2017  ? Procedure: REVERSE LEFT SHOULDER ARTHROPLASTY;  Surgeon: Netta Cedars, MD;  Location: [place];  Service: Orthopedics;  Laterality: Left;  ? TOTAL KNEE ARTHROPLASTY  5/12  ? Lt  ? ? ?Current Medications: ?Current Meds  ?Medication Sig  ? acetaminophen (TYLENOL) 500 MG tablet Take 1,000 mg by mouth every 6 (six) hours as needed for headache (pain).  ? Cholecalciferol (VITAMIN D3 PO) Take 1 tablet by mouth 2 (two) times daily.   ? DULoxetine (CYMBALTA) 60 MG capsule Take 60 mg by mouth daily.  ? furosemide (LASIX) 20 MG tablet Take 1 tablet (20 mg total) by mouth daily.  ? gabapentin (NEURONTIN) 100 MG capsule 400 mg.  ? Multiple Vitamin (MULTIVITAMIN WITH MINERALS) TABS tablet Take 1 tablet by mouth daily.  ? omeprazole (PRILOSEC) 20 MG capsule Take 20 mg by mouth  daily.  ? Oxycodone HCl 10 MG TABS Take 10 mg by mouth every 6 (six) hours as needed (pain).   ? rosuvastatin (CRESTOR) 20 MG tablet Take 20 mg by mouth daily.  ? solifenacin (VESICARE) 10 MG tablet Take 10 mg by mouth at bedtime.   ?  ? ?Allergies:   Macrobid [nitrofurantoin] and Sulfa antibiotics  ? ?Social History  ? ?Socioeconomic History  ? Marital status: Married  ?  Spouse name: Not on file  ? Number of children: Not on file  ? Years of education: Not on file  ? Highest education level: Some college, no degree  ?Occupational History  ? Not on file  ?Tobacco Use  ? Smoking status: Former  ?  Packs/day: 1.00  ?  Years: 20.00  ?  Pack years: 20.00  ?  Types: Cigarettes  ?   Quit date: 10/22/1986  ?  Years since quitting: 35.3  ? Smokeless tobacco: Never  ?Vaping Use  ? Vaping Use: Never used  ?Substance and Sexual Activity  ? Alcohol use: Yes  ?  Comment: rarely  ? Drug use: No  ? Sexual activity: Never  ?Other Topics Concern  ? Not on file  ?Social History Narrative  ? Right handed   ? 2 cups daily of caffeine  ? Lives at home with husband    ? ?Social Determinants of Health  ? ?Financial Resource Strain: Not on file  ?Food Insecurity: Not on file  ?Transportation Needs: Not on file  ?Physical Activity: Not on file  ?Stress: Not on file  ?Social Connections: Not on file  ?  ? ?Family History: ?The patient's family history includes Cancer in her mother and sister; Coronary artery disease in her sister; Coronary artery disease (age of onset: 26) in her brother; Diabetes in her mother and sister; Hypertension in her mother. ? ?ROS:   ?Please see the history of present illness.    ? All other systems reviewed and are negative. ? ?EKGs/Labs/Other Studies Reviewed:   ? ?The following studies were reviewed today: ? ? ?EKG:  EKG is  ordered today.  The ekg ordered today demonstrates  ? ?NSR, poor r wave progression, inferior q ? ?Recent Labs: ?No results found for requested labs within last 8760 hours.  ?Recent Lipid Panel ?No results found for: CHOL, TRIG, HDL, CHOLHDL, VLDL, LDLCALC, LDLDIRECT ? ? ?Risk Assessment/Calculations:   ?  ? ?    ? ?Physical Exam:   ? ?VS:  ?Vitals:  ? 02/22/22 1347  ?BP: 104/76  ?Pulse: 73  ?SpO2: 96%  ? ? ? ? BP 104/76   Pulse 73   Ht '4\' 11"'$  (1.499 m)   Wt 267 lb 12.8 oz (121.5 kg)   LMP  (LMP Unknown)   SpO2 96%   BMI 54.09 kg/m?    ? ?Wt Readings from Last 3 Encounters:  ?02/22/22 267 lb 12.8 oz (121.5 kg)  ?12/12/18 241 lb (109.3 kg)  ?12/11/18 242 lb 4.8 oz (109.9 kg)  ?  ? ?GEN:  morbidly obese, Well nourished, well developed in no acute distress ?HEENT: Normal ?NECK: No JVD; No carotid bruits ?LYMPHATICS: No lymphadenopathy ?CARDIAC: RRR, no murmurs,  rubs, gallops ?RESPIRATORY:  Clear to auscultation without rales, wheezing or rhonchi  ?ABDOMEN: Soft, non-tender, non-distended ?MUSCULOSKELETAL:  No edema; No deformity  ?SKIN: Warm and dry ?NEUROLOGIC:  Alert and oriented x 3 ?PSYCHIATRIC:  Normal affect  ? ?ASSESSMENT:   ? ?DOE: multifactorial. She does have hx of takotsubo. Her EF  recovered. Coronary dx is on the differential with risk factors,. Stress modalities are challenging in folks with significant obesity. Will repeat myoview. SOB may be related to elevated filling pressures. Her habitus makes exam challenging. Will get BNP today. Can be underestimated. No significant decompensation signs. We discussed obesity as a significant contributor and options for healthier delivery meals. We also discussed pulmonary component. ?- start lasix 20 mg daily ? ?HLD- continue crestor 20 mg daily. LDL at goal (64 mg/dL 02/08/2022) ? ?Elevated TSH- 8.3 02/08/2022; w/u per PCP  ? ?Obesity- can consider ozempic. Per patient declined consideration for bariatric surgery. ? ?PLAN:   ? ?In order of problems listed above: ? ?Lasix 20 mg daily ?TTE ?BNP ?PFTs ?Follow up 3 months ? ?   ? ?Medication Adjustments/Labs and Tests Ordered: ?Current medicines are reviewed at length with the patient today.  Concerns regarding medicines are outlined above.  ?Orders Placed This Encounter  ?Procedures  ? Brain natriuretic peptide  ? EKG 12-Lead  ? ECHOCARDIOGRAM COMPLETE  ? Pulmonary Function Test  ? ?Meds ordered this encounter  ?Medications  ? furosemide (LASIX) 20 MG tablet  ?  Sig: Take 1 tablet (20 mg total) by mouth daily.  ?  Dispense:  30 tablet  ?  Refill:  2  ? ? ?Patient Instructions  ?Medication Instructions:  ?START: LASIX (FUROSEMIDE) '20mg'$  ONCE DAILY  ?*If you need a refill on your cardiac medications before your next appointment, please call your pharmacy* ? ?Lab Work: ?BLOOD WORK TODAY  ?If you have labs (blood work) drawn today and your tests are completely normal, you will  receive your results only by: ?MyChart Message (if you have MyChart) OR ?A paper copy in the mail ?If you have any lab test that is abnormal or we need to change your treatment, we will call you to review the re

## 2022-02-23 LAB — BRAIN NATRIURETIC PEPTIDE: BNP: 14.4 pg/mL (ref 0.0–100.0)

## 2022-02-24 ENCOUNTER — Encounter: Payer: Self-pay | Admitting: *Deleted

## 2022-03-01 ENCOUNTER — Ambulatory Visit (HOSPITAL_COMMUNITY)
Admission: RE | Admit: 2022-03-01 | Discharge: 2022-03-01 | Disposition: A | Discharge status: Home / Self Care | Payer: PPO | Source: Ambulatory Visit | Attending: Internal Medicine | Admitting: Internal Medicine

## 2022-03-01 ENCOUNTER — Encounter: Payer: Self-pay | Admitting: *Deleted

## 2022-03-01 DIAGNOSIS — R0609 Other forms of dyspnea: Secondary | ICD-10-CM | POA: Insufficient documentation

## 2022-03-01 LAB — PULMONARY FUNCTION TEST
DL/VA % pred: 89 %
DL/VA: 3.8 ml/min/mmHg/L
DLCO unc % pred: 70 %
DLCO unc: 11.33 ml/min/mmHg
FEF 25-75 Post: 1.78 L/sec
FEF 25-75 Pre: 1.12 L/sec
FEF2575-%Change-Post: 58 %
FEF2575-%Pred-Post: 133 %
FEF2575-%Pred-Pre: 84 %
FEV1-%Change-Post: 19 %
FEV1-%Pred-Post: 97 %
FEV1-%Pred-Pre: 81 %
FEV1-Post: 1.58 L
FEV1-Pre: 1.32 L
FEV1FVC-%Change-Post: 4 %
FEV1FVC-%Pred-Pre: 106 %
FEV6-%Change-Post: 11 %
FEV6-%Pred-Post: 89 %
FEV6-%Pred-Pre: 80 %
FEV6-Post: 1.86 L
FEV6-Pre: 1.66 L
FEV6FVC-%Pred-Post: 105 %
FEV6FVC-%Pred-Pre: 105 %
FVC-%Change-Post: 14 %
FVC-%Pred-Post: 87 %
FVC-%Pred-Pre: 76 %
FVC-Post: 1.91 L
FVC-Pre: 1.66 L
Post FEV1/FVC ratio: 83 %
Post FEV6/FVC ratio: 100 %
Pre FEV1/FVC ratio: 79 %
Pre FEV6/FVC Ratio: 100 %

## 2022-03-01 MED ORDER — ALBUTEROL SULFATE (2.5 MG/3ML) 0.083% IN NEBU
2.5000 mg | INHALATION_SOLUTION | Freq: Once | RESPIRATORY_TRACT | Status: AC
  Administered 2022-03-01: 2.5 mg via RESPIRATORY_TRACT

## 2022-03-03 DIAGNOSIS — M5416 Radiculopathy, lumbar region: Secondary | ICD-10-CM | POA: Diagnosis not present

## 2022-03-10 ENCOUNTER — Ambulatory Visit (HOSPITAL_COMMUNITY): Payer: PPO

## 2022-03-16 ENCOUNTER — Other Ambulatory Visit: Payer: PPO

## 2022-03-24 ENCOUNTER — Ambulatory Visit (INDEPENDENT_AMBULATORY_CARE_PROVIDER_SITE_OTHER): Payer: PPO

## 2022-03-24 DIAGNOSIS — R0609 Other forms of dyspnea: Secondary | ICD-10-CM

## 2022-03-24 LAB — ECHOCARDIOGRAM COMPLETE
AR max vel: 1.96 cm2
AV Area VTI: 2.23 cm2
AV Area mean vel: 1.93 cm2
AV Mean grad: 18 mmHg
AV Peak grad: 30.7 mmHg
Ao pk vel: 2.77 m/s
Area-P 1/2: 2.13 cm2
S' Lateral: 2.9 cm

## 2022-04-21 DIAGNOSIS — H905 Unspecified sensorineural hearing loss: Secondary | ICD-10-CM | POA: Diagnosis not present

## 2022-05-18 DIAGNOSIS — H905 Unspecified sensorineural hearing loss: Secondary | ICD-10-CM | POA: Diagnosis not present

## 2022-06-01 ENCOUNTER — Ambulatory Visit: Admitting: Internal Medicine

## 2022-06-02 DIAGNOSIS — F330 Major depressive disorder, recurrent, mild: Secondary | ICD-10-CM | POA: Diagnosis not present

## 2022-06-13 DIAGNOSIS — M5416 Radiculopathy, lumbar region: Secondary | ICD-10-CM | POA: Diagnosis not present

## 2022-06-14 ENCOUNTER — Encounter: Payer: Self-pay | Admitting: Internal Medicine

## 2022-06-28 DIAGNOSIS — R32 Unspecified urinary incontinence: Secondary | ICD-10-CM | POA: Diagnosis not present

## 2022-06-28 DIAGNOSIS — Z9849 Cataract extraction status, unspecified eye: Secondary | ICD-10-CM | POA: Diagnosis not present

## 2022-06-28 DIAGNOSIS — Z87891 Personal history of nicotine dependence: Secondary | ICD-10-CM | POA: Diagnosis not present

## 2022-06-28 DIAGNOSIS — E785 Hyperlipidemia, unspecified: Secondary | ICD-10-CM | POA: Diagnosis not present

## 2022-06-28 DIAGNOSIS — E039 Hypothyroidism, unspecified: Secondary | ICD-10-CM | POA: Diagnosis not present

## 2022-06-28 DIAGNOSIS — J449 Chronic obstructive pulmonary disease, unspecified: Secondary | ICD-10-CM | POA: Diagnosis not present

## 2022-06-28 DIAGNOSIS — Z6841 Body Mass Index (BMI) 40.0 and over, adult: Secondary | ICD-10-CM | POA: Diagnosis not present

## 2022-06-28 DIAGNOSIS — G8929 Other chronic pain: Secondary | ICD-10-CM | POA: Diagnosis not present

## 2022-06-28 DIAGNOSIS — F330 Major depressive disorder, recurrent, mild: Secondary | ICD-10-CM | POA: Diagnosis not present

## 2022-06-28 DIAGNOSIS — F419 Anxiety disorder, unspecified: Secondary | ICD-10-CM | POA: Diagnosis not present

## 2022-06-28 DIAGNOSIS — E669 Obesity, unspecified: Secondary | ICD-10-CM | POA: Diagnosis not present

## 2022-07-08 ENCOUNTER — Encounter: Payer: Self-pay | Admitting: Internal Medicine

## 2022-07-08 ENCOUNTER — Ambulatory Visit: Payer: PPO | Attending: Internal Medicine | Admitting: Internal Medicine

## 2022-07-08 VITALS — BP 146/80 | HR 75 | Ht 59.0 in | Wt 273.0 lb

## 2022-07-08 DIAGNOSIS — R0600 Dyspnea, unspecified: Secondary | ICD-10-CM

## 2022-07-08 MED ORDER — FUROSEMIDE 20 MG PO TABS: 20.0000 mg | ORAL_TABLET | Freq: Every day | ORAL | 4 refills | Status: DC

## 2022-07-08 NOTE — Patient Instructions (Addendum)
Medication Instructions:  Your physician recommends that you continue on your current medications as directed. Please refer to the Current Medication list given to you today.  *If you need a refill on your cardiac medications before your next appointment, please call your pharmacy*   Follow-Up: At [facility], you and your health needs are our priority.  As part of our continuing mission to provide you with exceptional heart care, we have created designated Provider Care Teams.  These Care Teams include your primary Cardiologist (physician) and Advanced Practice Providers (APPs -  Physician Assistants and Nurse Practitioners) who all work together to provide you with the care you need, when you need it.  We recommend signing up for the patient portal called "MyChart".  Sign up information is provided on this After Visit Summary.  MyChart is used to connect with patients for Virtual Visits (Telemedicine).  Patients are able to view lab/test results, encounter notes, upcoming appointments, etc.  Non-urgent messages can be sent to your provider as well.   To learn more about what you can do with MyChart, go to [email or web address].    Your next appointment:   12 month(s)  The format for your next appointment:   In Person  Provider:   Janina Mayo, MD     Other Instructions Please obtain a blood pressure cuff (Omron) at [place] or target. Please check blood pressures for one week. If they are on average > 130/80 mmHg then let us know

## 2022-07-08 NOTE — Progress Notes (Signed)
Cardiology Office Note:    Date:  07/08/2022   ID:  Alexis Edwards, DOB 11/07/1945, MRN [id number]  PCP:  Leonides Sake, MD   [facility] Providers Cardiologist:  Janina Mayo, MD     Referring MD: Leonides Sake, MD   No chief complaint on file. Dyspnea  History of Present Illness:    Alexis Edwards is a 76 y.o. female with a hx of meningiom s/p craniotomy, COPD, CKD stage IIIA,  former smoker, severe obesity, referral for DOE  Former patient of Dr. Claiborne Billings. Has hx of sleep apnea. 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   She was seen recently seen by Dr. Lisbeth Ply her PCP. She was having dyspnea on exertion with minimal activity. EKG was normal. She notes getting LH and dizzy. SOB has progressed in the last 6 months.  She has some chest pressure at rest, not with activity. No PND, orthopnea, or LE edema. Could not tolerate CPAP even the non mask alternative. Considered gastric surgery, but declined. Notes eating TV dinners mostly and being very sedentary since her husband died 2 years ago. Blood pressures are in good control. TSH mildly elevated  Interim Hx 07/08/2022: She had improvement with lasix. No wheezing. No PND, orthopnea or LE edema. Her breathing still limits her. No chest pressure. She continues to refrain from smoking.  Cardiology Studies:  TTE 12/26/2016- Normal LV fxn. No valve dx. Myoview 2012- normal stress ecg. No perfusion abnormalities  TTE 03/24/2022 - EF 60-6%, normal RV, mild LVH, no pulmonary HTN, no valve dx.    Past Medical History:  Diagnosis Date   Arthritis    CHF (congestive heart failure) ([facility])    Claustrophobia    COPD (chronic obstructive pulmonary disease) ([facility])    Depression    Dyslipidemia    Dyspnea    GERD (gastroesophageal reflux disease)    Heart murmur    History of kidney stones    HTN (hypertension)    Hyperlipidemia    Meningioma ([place]) 11/14/2018    Obesity    BMI 55   Pneumonia    hx   Sleep apnea    does not use cpap   Sleep apnea    Takotsubo syndrome April 2013   apical ballooning, [facility]- [place]    Past Surgical History:  Procedure Laterality Date   ABDOMINAL HYSTERECTOMY     APPLICATION OF CRANIAL NAVIGATION N/A 12/12/2018   Procedure: APPLICATION OF CRANIAL NAVIGATION;  Surgeon: Judith Part, MD;  Location: [place];  Service: Neurosurgery;  Laterality: N/A;   CARDIAC CATHETERIZATION     CHOLECYSTECTOMY  2/10   CRANIOTOMY Left 12/12/2018   Procedure: Left Craniotomy for tumor resection with brainlab;  Surgeon: Judith Part, MD;  Location: [place];  Service: Neurosurgery;  Laterality: Left;   KIDNEY STONE SURGERY     REVERSE SHOULDER ARTHROPLASTY Left 02/24/2017   Procedure: REVERSE LEFT SHOULDER ARTHROPLASTY;  Surgeon: Netta Cedars, MD;  Location: [place];  Service: Orthopedics;  Laterality: Left;   TOTAL KNEE ARTHROPLASTY  5/12   Lt    Current Medications: Current Meds  Medication Sig   acetaminophen (TYLENOL) 500 MG tablet Take 1,000 mg by mouth every 6 (six) hours as needed for headache (pain).   Cholecalciferol (VITAMIN D3 PO) Take 1 tablet by mouth 2 (two) times daily.    DULoxetine (CYMBALTA) 60 MG capsule Take 60 mg by mouth daily.   gabapentin (  NEURONTIN) 100 MG capsule 400 mg.   Multiple Vitamin (MULTIVITAMIN WITH MINERALS) TABS tablet Take 1 tablet by mouth daily.   omeprazole (PRILOSEC) 20 MG capsule Take 20 mg by mouth daily.   Oxycodone HCl 10 MG TABS Take 10 mg by mouth every 6 (six) hours as needed (pain).    rosuvastatin (CRESTOR) 20 MG tablet Take 20 mg by mouth daily.   solifenacin (VESICARE) 10 MG tablet Take 10 mg by mouth at bedtime.      Allergies:   Macrobid [nitrofurantoin] and Sulfa antibiotics   Social History   Socioeconomic History   Marital status: Married    Spouse name: Not on file   Number of children: Not on file   Years of education: Not on file   Highest education  level: Some college, no degree  Occupational History   Not on file  Tobacco Use   Smoking status: Former    Packs/day: 1.00    Years: 20.00    Total pack years: 20.00    Types: Cigarettes    Quit date: 10/22/1986    Years since quitting: 35.7   Smokeless tobacco: Never  Vaping Use   Vaping Use: Never used  Substance and Sexual Activity   Alcohol use: Yes    Comment: rarely   Drug use: No   Sexual activity: Never  Other Topics Concern   Not on file  Social History Narrative   Right handed    2 cups daily of caffeine   Lives at home with husband     Social Determinants of Health   Financial Resource Strain: Not on file  Food Insecurity: Not on file  Transportation Needs: Not on file  Physical Activity: Not on file  Stress: Not on file  Social Connections: Not on file     Family History: The patient's family history includes Cancer in her mother and sister; Coronary artery disease in her sister; Coronary artery disease (age of onset: 58) in her brother; Diabetes in her mother and sister; Hypertension in her mother.  ROS:   Please see the history of present illness.     All other systems reviewed and are negative.  EKGs/Labs/Other Studies Reviewed:    The following studies were reviewed today:   EKG:  EKG is  ordered today.  The ekg ordered today demonstrates   02/22/2022-NSR, poor r wave progression, inferior q  Recent Labs: 02/22/2022: BNP 14.4  Recent Lipid Panel No results found for: "CHOL", "TRIG", "HDL", "CHOLHDL", "VLDL", "LDLCALC", "LDLDIRECT"   Risk Assessment/Calculations:           Physical Exam:    VS:  Vitals:   07/08/22 1104  BP: (!) 146/80  Pulse: 75  SpO2: 94%      BP (!) 146/80 (BP Location: Left Arm, Patient Position: Sitting, Cuff Size: Large)   Pulse 75   Ht '4\' 11"'$  (1.499 m)   Wt 273 lb (123.8 kg)   LMP  (LMP Unknown)   SpO2 94%   BMI 55.14 kg/m     Wt Readings from Last 3 Encounters:  07/08/22 273 lb (123.8 kg)   02/22/22 267 lb 12.8 oz (121.5 kg)  12/12/18 241 lb (109.3 kg)     GEN:  morbidly obese, Well nourished, well developed in no acute distress HEENT: Normal NECK: No JVD; No carotid bruits LYMPHATICS: No lymphadenopathy CARDIAC: RRR, no murmurs, rubs, gallops RESPIRATORY:  Clear to auscultation without rales, wheezing or rhonchi  ABDOMEN: Soft, non-tender, non-distended MUSCULOSKELETAL:  No  edema; No deformity  SKIN: Warm and dry NEUROLOGIC:  Alert and oriented x 3 PSYCHIATRIC:  Normal affect   ASSESSMENT:    DOE: multifactorial. She does have hx of takotsubo. Her EF recovered. E/e' 13 with Grade 1 DD, she likely has elevated LVEDP with activity. BNP was low but can be underestimated with obesity. Otherwise EF is normal. We discussed obesity as a significant contributor and options for healthier delivery meals. PFTs showed minimal obstructive airway dx. - continue  lasix 20 mg daily  HLD- continue crestor 20 mg daily. LDL at goal (64 mg/dL 02/08/2022)  Elevated TSH- 8.3 02/08/2022; w/u per PCP   HTN: ambulatory monitoring. BP goal < 130/ 80 mmHg. We discussed this.  COPD: per PCP  Obesity- can consider GLP 1 agonist.  Per patient declined consideration for bariatric surgery.  PLAN:    In order of problems listed above:   Follow up 12 months      Medication Adjustments/Labs and Tests Ordered: Current medicines are reviewed at length with the patient today.  Concerns regarding medicines are outlined above.  No orders of the defined types were placed in this encounter.  Meds ordered this encounter  Medications   furosemide (LASIX) 20 MG tablet    Sig: Take 1 tablet (20 mg total) by mouth daily.    Dispense:  90 tablet    Refill:  4    Patient Instructions  Medication Instructions:  Your physician recommends that you continue on your current medications as directed. Please refer to the Current Medication list given to you today.  *If you need a refill on your cardiac  medications before your next appointment, please call your pharmacy*   Follow-Up: At [facility], you and your health needs are our priority.  As part of our continuing mission to provide you with exceptional heart care, we have created designated Provider Care Teams.  These Care Teams include your primary Cardiologist (physician) and Advanced Practice Providers (APPs -  Physician Assistants and Nurse Practitioners) who all work together to provide you with the care you need, when you need it.  We recommend signing up for the patient portal called "MyChart".  Sign up information is provided on this After Visit Summary.  MyChart is used to connect with patients for Virtual Visits (Telemedicine).  Patients are able to view lab/test results, encounter notes, upcoming appointments, etc.  Non-urgent messages can be sent to your provider as well.   To learn more about what you can do with MyChart, go to [email or web address].    Your next appointment:   12 month(s)  The format for your next appointment:   In Person  Provider:   Janina Mayo, MD     Other Instructions Please obtain a blood pressure cuff (Omron) at [place] or [employer]. Please check blood pressures for one week. If they are on average > 130/80 mmHg then let us know    Signed, Janina Mayo, MD  07/08/2022 11:30 AM    [facility]

## 2022-07-21 DIAGNOSIS — Z0000 Encounter for general adult medical examination without abnormal findings: Secondary | ICD-10-CM | POA: Diagnosis not present

## 2022-07-21 DIAGNOSIS — E669 Obesity, unspecified: Secondary | ICD-10-CM | POA: Diagnosis not present

## 2022-07-21 DIAGNOSIS — Z6841 Body Mass Index (BMI) 40.0 and over, adult: Secondary | ICD-10-CM | POA: Diagnosis not present

## 2022-07-21 DIAGNOSIS — Z1331 Encounter for screening for depression: Secondary | ICD-10-CM | POA: Diagnosis not present

## 2022-07-21 DIAGNOSIS — E785 Hyperlipidemia, unspecified: Secondary | ICD-10-CM | POA: Diagnosis not present

## 2022-08-23 DIAGNOSIS — M5416 Radiculopathy, lumbar region: Secondary | ICD-10-CM | POA: Diagnosis not present

## 2022-08-23 DIAGNOSIS — M5136 Other intervertebral disc degeneration, lumbar region: Secondary | ICD-10-CM | POA: Diagnosis not present

## 2022-08-23 DIAGNOSIS — Z79899 Other long term (current) drug therapy: Secondary | ICD-10-CM | POA: Diagnosis not present

## 2022-08-23 DIAGNOSIS — G894 Chronic pain syndrome: Secondary | ICD-10-CM | POA: Diagnosis not present

## 2022-08-23 DIAGNOSIS — M25561 Pain in right knee: Secondary | ICD-10-CM | POA: Diagnosis not present

## 2022-08-23 DIAGNOSIS — Z5181 Encounter for therapeutic drug level monitoring: Secondary | ICD-10-CM | POA: Diagnosis not present

## 2022-09-06 DIAGNOSIS — F330 Major depressive disorder, recurrent, mild: Secondary | ICD-10-CM | POA: Diagnosis not present

## 2022-09-06 DIAGNOSIS — E039 Hypothyroidism, unspecified: Secondary | ICD-10-CM | POA: Diagnosis not present

## 2022-09-06 DIAGNOSIS — Z6841 Body Mass Index (BMI) 40.0 and over, adult: Secondary | ICD-10-CM | POA: Diagnosis not present

## 2022-09-06 DIAGNOSIS — I5020 Unspecified systolic (congestive) heart failure: Secondary | ICD-10-CM | POA: Diagnosis not present

## 2022-09-06 DIAGNOSIS — J439 Emphysema, unspecified: Secondary | ICD-10-CM | POA: Diagnosis not present

## 2022-09-06 DIAGNOSIS — N1831 Chronic kidney disease, stage 3a: Secondary | ICD-10-CM | POA: Diagnosis not present

## 2022-09-06 DIAGNOSIS — E785 Hyperlipidemia, unspecified: Secondary | ICD-10-CM | POA: Diagnosis not present

## 2022-12-01 DIAGNOSIS — R413 Other amnesia: Secondary | ICD-10-CM | POA: Diagnosis not present

## 2023-02-08 ENCOUNTER — Observation Stay (HOSPITAL_COMMUNITY)
Admission: EM | Admit: 2023-02-08 | Discharge: 2023-02-13 | Disposition: A | Discharge status: Home Health | Payer: Medicare PPO | Attending: Internal Medicine | Admitting: Internal Medicine

## 2023-02-08 ENCOUNTER — Other Ambulatory Visit: Payer: Self-pay

## 2023-02-08 DIAGNOSIS — N2889 Other specified disorders of kidney and ureter: Secondary | ICD-10-CM | POA: Diagnosis not present

## 2023-02-08 DIAGNOSIS — Z96652 Presence of left artificial knee joint: Secondary | ICD-10-CM | POA: Diagnosis not present

## 2023-02-08 DIAGNOSIS — I509 Heart failure, unspecified: Secondary | ICD-10-CM | POA: Diagnosis not present

## 2023-02-08 DIAGNOSIS — Z96612 Presence of left artificial shoulder joint: Secondary | ICD-10-CM | POA: Insufficient documentation

## 2023-02-08 DIAGNOSIS — R7989 Other specified abnormal findings of blood chemistry: Secondary | ICD-10-CM | POA: Diagnosis not present

## 2023-02-08 DIAGNOSIS — I110 Hypertensive heart disease with heart failure: Secondary | ICD-10-CM | POA: Insufficient documentation

## 2023-02-08 DIAGNOSIS — Z86011 Personal history of benign neoplasm of the brain: Secondary | ICD-10-CM | POA: Diagnosis not present

## 2023-02-08 DIAGNOSIS — R531 Weakness: Secondary | ICD-10-CM | POA: Diagnosis not present

## 2023-02-08 DIAGNOSIS — J449 Chronic obstructive pulmonary disease, unspecified: Secondary | ICD-10-CM | POA: Insufficient documentation

## 2023-02-08 DIAGNOSIS — J189 Pneumonia, unspecified organism: Secondary | ICD-10-CM | POA: Insufficient documentation

## 2023-02-08 DIAGNOSIS — D751 Secondary polycythemia: Secondary | ICD-10-CM | POA: Diagnosis not present

## 2023-02-08 DIAGNOSIS — E039 Hypothyroidism, unspecified: Secondary | ICD-10-CM | POA: Diagnosis not present

## 2023-02-08 DIAGNOSIS — R110 Nausea: Secondary | ICD-10-CM | POA: Diagnosis not present

## 2023-02-08 DIAGNOSIS — R55 Syncope and collapse: Secondary | ICD-10-CM | POA: Diagnosis not present

## 2023-02-08 DIAGNOSIS — Z79899 Other long term (current) drug therapy: Secondary | ICD-10-CM | POA: Insufficient documentation

## 2023-02-08 DIAGNOSIS — Z87891 Personal history of nicotine dependence: Secondary | ICD-10-CM | POA: Diagnosis not present

## 2023-02-08 DIAGNOSIS — N289 Disorder of kidney and ureter, unspecified: Secondary | ICD-10-CM

## 2023-02-08 DIAGNOSIS — R413 Other amnesia: Secondary | ICD-10-CM | POA: Diagnosis not present

## 2023-02-08 DIAGNOSIS — R001 Bradycardia, unspecified: Secondary | ICD-10-CM | POA: Diagnosis not present

## 2023-02-08 DIAGNOSIS — G8929 Other chronic pain: Secondary | ICD-10-CM | POA: Diagnosis present

## 2023-02-08 DIAGNOSIS — R42 Dizziness and giddiness: Secondary | ICD-10-CM | POA: Diagnosis not present

## 2023-02-08 DIAGNOSIS — R0989 Other specified symptoms and signs involving the circulatory and respiratory systems: Secondary | ICD-10-CM | POA: Diagnosis not present

## 2023-02-08 LAB — CBC WITH DIFFERENTIAL/PLATELET
Abs Immature Granulocytes: 0.02 10*3/uL (ref 0.00–0.07)
Basophils Absolute: 0.1 10*3/uL (ref 0.0–0.1)
Basophils Relative: 1 %
Eosinophils Absolute: 0.1 10*3/uL (ref 0.0–0.5)
Eosinophils Relative: 1 %
HCT: 49.2 % — ABNORMAL HIGH (ref 36.0–46.0)
Hemoglobin: 16.5 g/dL — ABNORMAL HIGH (ref 12.0–15.0)
Immature Granulocytes: 0 %
Lymphocytes Relative: 26 %
Lymphs Abs: 2.5 10*3/uL (ref 0.7–4.0)
MCH: 33.6 pg (ref 26.0–34.0)
MCHC: 33.5 g/dL (ref 30.0–36.0)
MCV: 100.2 fL — ABNORMAL HIGH (ref 80.0–100.0)
Monocytes Absolute: 0.6 10*3/uL (ref 0.1–1.0)
Monocytes Relative: 6 %
Neutro Abs: 6.5 10*3/uL (ref 1.7–7.7)
Neutrophils Relative %: 66 %
Platelets: 229 10*3/uL (ref 150–400)
RBC: 4.91 MIL/uL (ref 3.87–5.11)
RDW: 12.3 % (ref 11.5–15.5)
WBC: 9.8 10*3/uL (ref 4.0–10.5)
nRBC: 0 % (ref 0.0–0.2)

## 2023-02-08 LAB — COMPREHENSIVE METABOLIC PANEL
ALT: 11 U/L (ref 0–44)
AST: 18 U/L (ref 15–41)
Albumin: 3.6 g/dL (ref 3.5–5.0)
Alkaline Phosphatase: 78 U/L (ref 38–126)
Anion gap: 12 (ref 5–15)
BUN: 13 mg/dL (ref 8–23)
CO2: 27 mmol/L (ref 22–32)
Calcium: 9.3 mg/dL (ref 8.9–10.3)
Chloride: 103 mmol/L (ref 98–111)
Creatinine, Ser: 1.25 mg/dL — ABNORMAL HIGH (ref 0.44–1.00)
GFR, Estimated: 44 mL/min — ABNORMAL LOW (ref >60)
Glucose, Bld: 174 mg/dL — ABNORMAL HIGH (ref 70–99)
Potassium: 4 mmol/L (ref 3.5–5.1)
Sodium: 142 mmol/L (ref 135–145)
Total Bilirubin: 0.9 mg/dL (ref 0.3–1.2)
Total Protein: 6.7 g/dL (ref 6.5–8.1)

## 2023-02-08 NOTE — ED Triage Notes (Signed)
Patient arrived with EMS reports brief syncopal episode while at a bingo session this evening , no injury , alert and oriented at arrival , CBG= 186, respirations unlabored , patient articulated feeling lightheaded/dizzy .

## 2023-02-08 NOTE — ED Notes (Signed)
Pt arrived to room from triage via wc c/o headache and dizziness stating she passed out while playing bingo tonight and was brought here by ems. Patient denies fall denies injury but states she just feels like she is going to passout and remains dizzy. Pt denies room spinning. Respirations even and non labored a.o x 4

## 2023-02-09 ENCOUNTER — Encounter (HOSPITAL_COMMUNITY): Payer: Self-pay | Admitting: Internal Medicine

## 2023-02-09 ENCOUNTER — Emergency Department (HOSPITAL_COMMUNITY): Payer: Medicare PPO

## 2023-02-09 DIAGNOSIS — Z86011 Personal history of benign neoplasm of the brain: Secondary | ICD-10-CM | POA: Diagnosis not present

## 2023-02-09 DIAGNOSIS — D751 Secondary polycythemia: Secondary | ICD-10-CM | POA: Diagnosis not present

## 2023-02-09 DIAGNOSIS — E039 Hypothyroidism, unspecified: Secondary | ICD-10-CM | POA: Diagnosis present

## 2023-02-09 DIAGNOSIS — R0989 Other specified symptoms and signs involving the circulatory and respiratory systems: Secondary | ICD-10-CM | POA: Diagnosis not present

## 2023-02-09 DIAGNOSIS — R413 Other amnesia: Secondary | ICD-10-CM | POA: Diagnosis present

## 2023-02-09 DIAGNOSIS — J189 Pneumonia, unspecified organism: Secondary | ICD-10-CM | POA: Diagnosis present

## 2023-02-09 DIAGNOSIS — G8929 Other chronic pain: Secondary | ICD-10-CM | POA: Diagnosis present

## 2023-02-09 DIAGNOSIS — R7989 Other specified abnormal findings of blood chemistry: Secondary | ICD-10-CM | POA: Diagnosis not present

## 2023-02-09 DIAGNOSIS — R55 Syncope and collapse: Secondary | ICD-10-CM | POA: Diagnosis present

## 2023-02-09 DIAGNOSIS — N289 Disorder of kidney and ureter, unspecified: Secondary | ICD-10-CM

## 2023-02-09 LAB — TSH: TSH: 8.543 u[IU]/mL — ABNORMAL HIGH (ref 0.350–4.500)

## 2023-02-09 LAB — MAGNESIUM: Magnesium: 1.9 mg/dL (ref 1.7–2.4)

## 2023-02-09 LAB — URINALYSIS, ROUTINE W REFLEX MICROSCOPIC
Bilirubin Urine: NEGATIVE
Glucose, UA: NEGATIVE mg/dL
Hgb urine dipstick: NEGATIVE
Ketones, ur: NEGATIVE mg/dL
Nitrite: NEGATIVE
Protein, ur: NEGATIVE mg/dL
Specific Gravity, Urine: 1.025 (ref 1.005–1.030)
pH: 5.5 (ref 5.0–8.0)

## 2023-02-09 LAB — T4, FREE: Free T4: 0.99 ng/dL (ref 0.61–1.12)

## 2023-02-09 LAB — TROPONIN I (HIGH SENSITIVITY)
Troponin I (High Sensitivity): 35 ng/L — ABNORMAL HIGH (ref <18)
Troponin I (High Sensitivity): 35 ng/L — ABNORMAL HIGH (ref <18)

## 2023-02-09 LAB — URINALYSIS, MICROSCOPIC (REFLEX): RBC / HPF: NONE SEEN RBC/hpf (ref 0–5)

## 2023-02-09 LAB — D-DIMER, QUANTITATIVE: D-Dimer, Quant: 0.51 ug/mL-FEU — ABNORMAL HIGH (ref 0.00–0.50)

## 2023-02-09 LAB — BRAIN NATRIURETIC PEPTIDE: B Natriuretic Peptide: 28.5 pg/mL (ref 0.0–100.0)

## 2023-02-09 LAB — GLUCOSE, CAPILLARY: Glucose-Capillary: 160 mg/dL — ABNORMAL HIGH (ref 70–99)

## 2023-02-09 LAB — PROCALCITONIN: Procalcitonin: <0.1 ng/mL

## 2023-02-09 MED ORDER — ENOXAPARIN SODIUM 40 MG/0.4ML IJ SOSY
40.0000 mg | PREFILLED_SYRINGE | INTRAMUSCULAR | Status: DC
  Administered 2023-02-09 – 2023-02-13 (×5): 40 mg via SUBCUTANEOUS
  Filled 2023-02-09 (×5): qty 0.4

## 2023-02-09 MED ORDER — GABAPENTIN 400 MG PO CAPS
400.0000 mg | ORAL_CAPSULE | Freq: Three times a day (TID) | ORAL | Status: DC
  Administered 2023-02-09 – 2023-02-13 (×12): 400 mg via ORAL
  Filled 2023-02-09 (×12): qty 1

## 2023-02-09 MED ORDER — ACETAMINOPHEN 325 MG PO TABS
650.0000 mg | ORAL_TABLET | Freq: Four times a day (QID) | ORAL | Status: DC | PRN
  Administered 2023-02-12: 650 mg via ORAL
  Filled 2023-02-09: qty 2

## 2023-02-09 MED ORDER — IOHEXOL 350 MG/ML SOLN
75.0000 mL | Freq: Once | INTRAVENOUS | Status: AC | PRN
  Administered 2023-02-09: 75 mL via INTRAVENOUS

## 2023-02-09 MED ORDER — DOXYCYCLINE HYCLATE 100 MG PO TABS
100.0000 mg | ORAL_TABLET | Freq: Once | ORAL | Status: AC
  Administered 2023-02-09: 100 mg via ORAL
  Filled 2023-02-09: qty 1

## 2023-02-09 MED ORDER — ONDANSETRON HCL 4 MG/2ML IJ SOLN: 4.0000 mg | Freq: Four times a day (QID) | INTRAMUSCULAR | Status: DC | PRN

## 2023-02-09 MED ORDER — SODIUM CHLORIDE 0.9% FLUSH
3.0000 mL | Freq: Two times a day (BID) | INTRAVENOUS | Status: DC
  Administered 2023-02-09 – 2023-02-13 (×8): 3 mL via INTRAVENOUS

## 2023-02-09 MED ORDER — ACETAMINOPHEN 650 MG RE SUPP: 650.0000 mg | Freq: Four times a day (QID) | RECTAL | Status: DC | PRN

## 2023-02-09 MED ORDER — ROSUVASTATIN CALCIUM 20 MG PO TABS
20.0000 mg | ORAL_TABLET | Freq: Every day | ORAL | Status: DC
  Administered 2023-02-09 – 2023-02-13 (×5): 20 mg via ORAL
  Filled 2023-02-09 (×5): qty 1

## 2023-02-09 MED ORDER — SODIUM CHLORIDE 0.9 % IV SOLN: INTRAVENOUS | Status: DC

## 2023-02-09 MED ORDER — ONDANSETRON HCL 4 MG PO TABS: 4.0000 mg | ORAL_TABLET | Freq: Four times a day (QID) | ORAL | Status: DC | PRN

## 2023-02-09 MED ORDER — DOXYCYCLINE HYCLATE 100 MG PO TABS
100.0000 mg | ORAL_TABLET | Freq: Two times a day (BID) | ORAL | Status: DC
  Administered 2023-02-09: 100 mg via ORAL
  Filled 2023-02-09: qty 1

## 2023-02-09 MED ORDER — DULOXETINE HCL 60 MG PO CPEP
60.0000 mg | ORAL_CAPSULE | Freq: Every day | ORAL | Status: DC
  Administered 2023-02-09 – 2023-02-13 (×5): 60 mg via ORAL
  Filled 2023-02-09 (×5): qty 1

## 2023-02-09 MED ORDER — FESOTERODINE FUMARATE ER 4 MG PO TB24
4.0000 mg | ORAL_TABLET | Freq: Every day | ORAL | Status: DC
  Administered 2023-02-09 – 2023-02-10 (×2): 4 mg via ORAL
  Filled 2023-02-09 (×2): qty 1

## 2023-02-09 MED ORDER — OXYCODONE HCL 5 MG PO TABS
10.0000 mg | ORAL_TABLET | Freq: Four times a day (QID) | ORAL | Status: DC | PRN
  Administered 2023-02-09: 10 mg via ORAL
  Filled 2023-02-09: qty 2

## 2023-02-09 NOTE — H&P (Addendum)
History and Physical    Patient: Alexis Edwards:[id number] DOB: Feb 06, 1946 DOA: 02/08/2023 DOS: the patient was seen and examined on 02/09/2023 PCP: Ailene Ravel, MD  Patient coming from: Via EMS  Chief Complaint:  Chief Complaint  Patient presents with   Syncope   HPI: Alexis Edwards is a 77 y.o. female with medical history significant of hypertension, hyperlipidemia, CHF, Takotsubo syndrome, COPD, chronic pain morbid obesity, OSA not on CPAP who presents after having a syncopal episode while playing bingo.  History is obtained from the patient with assistance of her daughter who is present at bedside.  Patient still lives alone, but daughter makes note that over the last 4 months her short-term memory has significantly worsened.  Patient reports that she thinks she had eaten some nachos at some point while she was playing bingo yesterday.  She recalls getting a global headache and feeling lightheaded.  Patient reports that she did not lose consciousness, but EMS reported that bystanders stated that she lost consciousness briefly for which they called EMS. Denies ay recent fevers, chest pain, palpitations, nausea, vomiting, diarrhea, leg swelling, calf pain,or change in appetite.  Patient does report having intermittent cough.  Denies any significant history of smoking, tobacco use, or drug use.  She does admit to sitting for long periods of time at home.  Patient's daughter makes note that the patient does not want to leave the house due to his connection with her husband who passed 2-1/2 years ago.  Patient lets me know that she is having issues with her memory several times throughout the visit.  Patient reports having history of hypothyroidism and being on levothyroxine but cannot recall the dose and daughter is unaware of this.  Patient receives her medications from the [facility] hospital.  She chronically has pain back and legs related to arthritis.  In the emergency department patient  was noted to be afebrile with heart rates 59-73, respirations 12-26, blood pressures 119/59-154/71, and O2 saturation currently maintained on room air.  Labs noted hemoglobin 16.5, BUN 13, creatinine 1.25, and high-sensitivity 35 x 2, and procalcitonin less than 0.1.  Chest x-ray noted mild patchy right lower lobe opacity suspicious for pneumonia.  CT angiogram of the head and neck did not note any large vessel occlusion or high-grade stenosis and unchanged left frontal encephalomalacia.  Urinalysis noted trace leukocytes, rare bacteria, 0-5 squamous epithelial cells, and 0-5 WBCs.  Patient had been given doxycycline 100 mg p.o. x 1 dose.  Review of Systems: As mentioned in the history of present illness. All other systems reviewed and are negative.  History is limited due to patient's issues with her memory Past Medical History:  Diagnosis Date   Arthritis    CHF (congestive heart failure)    Claustrophobia    COPD (chronic obstructive pulmonary disease)    Depression    Dyslipidemia    Dyspnea    GERD (gastroesophageal reflux disease)    Heart murmur    History of kidney stones    HTN (hypertension)    Hyperlipidemia    Meningioma 11/14/2018   Obesity    BMI 55   Pneumonia    hx   Sleep apnea    does not use cpap   Sleep apnea    Takotsubo syndrome April 2013   apical ballooning, [facility] [place]   Past Surgical History:  Procedure Laterality Date   ABDOMINAL HYSTERECTOMY     APPLICATION OF CRANIAL NAVIGATION N/A 12/12/2018   Procedure:  APPLICATION OF CRANIAL NAVIGATION;  Surgeon: Jadene Pierini, MD;  Location: [facility] OR;  Service: Neurosurgery;  Laterality: N/A;   CARDIAC CATHETERIZATION     CHOLECYSTECTOMY  2/10   CRANIOTOMY Left 12/12/2018   Procedure: Left Craniotomy for tumor resection with brainlab;  Surgeon: Jadene Pierini, MD;  Location: [facility] OR;  Service: Neurosurgery;  Laterality: Left;   KIDNEY STONE SURGERY     REVERSE SHOULDER ARTHROPLASTY Left 02/24/2017    Procedure: REVERSE LEFT SHOULDER ARTHROPLASTY;  Surgeon: Beverely Low, MD;  Location: [facility] OR;  Service: Orthopedics;  Laterality: Left;   TOTAL KNEE ARTHROPLASTY  5/12   Lt   Social History:  reports that she quit smoking about 36 years ago. Her smoking use included cigarettes. She has a 20.00 pack-year smoking history. She has never used smokeless tobacco. She reports current alcohol use. She reports that she does not use drugs.  Allergies  Allergen Reactions   Macrobid [Nitrofurantoin] Itching and Rash   Sulfa Antibiotics Itching and Rash    Family History  Problem Relation Age of Onset   Coronary artery disease Brother 52       stent   Diabetes Mother    Hypertension Mother    Cancer Mother    Coronary artery disease Sister    Diabetes Sister    Cancer Sister     Prior to Admission medications   Medication Sig Start Date End Date Taking? Authorizing Provider  acetaminophen (TYLENOL) 500 MG tablet Take 1,000 mg by mouth every 6 (six) hours as needed for headache (pain).    [provider]  Cholecalciferol (VITAMIN D3 PO) Take 1 tablet by mouth 2 (two) times daily.     [provider]  DULoxetine (CYMBALTA) 60 MG capsule Take 60 mg by mouth daily.    [provider]  furosemide (LASIX) 20 MG tablet Take 1 tablet (20 mg total) by mouth daily. 07/08/22   Maisie Fus, MD  gabapentin (NEURONTIN) 100 MG capsule 400 mg.    [provider]  Multiple Vitamin (MULTIVITAMIN WITH MINERALS) TABS tablet Take 1 tablet by mouth daily.    [provider]  omeprazole (PRILOSEC) 20 MG capsule Take 20 mg by mouth daily.    [provider]  Oxycodone HCl 10 MG TABS Take 10 mg by mouth every 6 (six) hours as needed (pain).     [provider]  rosuvastatin (CRESTOR) 20 MG tablet Take 20 mg by mouth daily.    [provider]  solifenacin (VESICARE) 10 MG tablet Take 10 mg by mouth at bedtime.     [provider]     Physical Exam: Vitals:   02/09/23 0700 02/09/23 0715 02/09/23 0730 02/09/23 0800  BP: (!) 135/57 135/60 (!) 136/56 (!) 119/59  Pulse: 62 60 60 63  Resp: Temp:      TempSrc:      SpO2: 92% 94% 94% 90%    Constitutional: Morbidly obese female who appears to be in no acute distress Eyes: PERRL, lids and conjunctivae normal ENMT: Mucous membranes are moist.  Neck: normal, supple  Respiratory: clear to auscultation bilaterally, no wheezing, no crackles. Normal respiratory effort.   Cardiovascular: Regular rate and rhythm, no murmurs / rubs / gallops. No lower extremity edema.   Abdomen: no tenderness, no masses palpated. Bowel sounds positive.  Musculoskeletal: no clubbing / cyanosis. No joint deformity upper and lower extremities. Good ROM, no contractures. Normal muscle tone.  Skin: no  rashes, lesions, ulcers. No induration Neurologic: CN 2-12 grossly intact.  Strength 5/5 in all 4.  Psychiatric: Poor short-term memory.  Alert and oriented x person and place.  Normal mood.   Data Reviewed:  EKG revealed normal sinus rhythm at 65 bpm.  Reviewed labs, imaging, and pertinent records as noted above in HPI.  Assessment and Plan: Syncope Acute.  Patient presents after having syncopal episode while playing bingo.  Report feeling having a global headache and feeling lightheaded prior to passing out.  No seizure-like activity was reported.  CT angiogram of the head and neck did not note any large vessel occlusion or mass. -Admit to telemetry bed -Check orthostatic vital signs -Check D-dimer and TSH -Check echocardiogram -Follow-up telemetry overnight. -PT to evaluate and treat -Will need to discuss safety precautions and [facility] law for which patient should not to drive for at least 6 months as cause of symptoms is not totally clear  Elevated troponin Acute.  High-sensitivity troponin essentially flat at 35.  Patient denied any complaints of chest pain.  EKG  without significant ischemic changes. -Follow-up echocardiogram  Possible pneumonia Reports having intermittent cough, but denies any fever.  Chest x-ray noting a right lower lobe opacity.  Patient has been started on empiric antibiotics of doxycycline.  Question possibility of aspiration event when patient had syncopal episode. -Check procalcitonin -Continue empiric antibiotics of doxycycline  Polycythemia Hemoglobin 16.5 on admission, but previously noted to be elevated as well.  Possibly secondary do to patient being dehydrated or untreated sleep apnea. -Recheck CBC tomorrow morning  Renal insufficiency On admission creatinine elevated up to 1.25.  Baseline creatinine previously in 2020 was 0.88.  Possibly related to patient being on furosemide and not eating and drinking like normal due to issues with memory. -Normal saline IV fluids at 75 mL/h overnight -Hold possible nephrotoxic agents like furosemide -Recheck kidney function tomorrow morning  Hypothyroidism Patient reports history of hypothyroidism for which she states she is on levothyroxine, but cannot remember the dose. -Check TSH ( 8.543) -Check free T4 -Consider resuming levothyroxine once able  Chronic pain Patient has pain in her back and legs which she is on oxycodone for pain. -Continue Cymbalta, gabapentin, and oxycodone as needed for pain  Memory loss Patient's for short-term memory, but daughter notes still currently living in her husband had alone after he passed away 2 years ago.  Question if patient actually eating and drinking like normal.  History of meningioma Patient with a prior history of meningioma which was resected.  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    OSA Patient reportedly not on CPAP at night  DVT prophylaxis: Lovenox Advance Care Planning:   Code Status: Full Code  Consults:   Family Communication: Daughter updated at  bedside  Severity of Illness: The appropriate patient status for this patient is OBSERVATION. Observation status is judged to be reasonable and necessary in order to provide the required intensity of service to ensure the patient's safety. The patient's presenting symptoms, physical exam findings, and initial radiographic and laboratory data in the context of their medical condition is felt to place them at decreased risk for further clinical deterioration. Furthermore, it is anticipated that the patient will be medically stable for discharge from the hospital within 2 midnights of admission.   Author: Clydie Braun, MD 02/09/2023 8:51 AM  For on call review [email or web address].

## 2023-02-09 NOTE — Progress Notes (Signed)
  Carryover admission to the Day Admitter.  I discussed this case with the EDP, Dr. Clayborne Dana.  Per these discussions:   This is a 77 year old female who has been admitted with syncope x 1 episode that occurred while she was at bingo on 02/08/2023.  This was associated with preceding dizziness/lightheadedness.  Not associate with any chest pain, shortness of breath, cough.  In terms of previous cardiac evaluation, she is reported to have undergone echocardiogram 1 to 2 years ago, with normal EF at that time, with most recent left-sided coronary angiography 10 years ago.  Labs today notable for initial troponin 35.  Presenting serum creatinine 1.25 compared to most recent prior value of 0.8.  EKG shows new T wave inversion in aVL, but otherwise no evidence of acute ischemic changes, including no evidence of ST elevation.  Chest x-ray showed potential pneumonia, for which the patient received doxycycline in the ED.  As she does not have any acute respiratory complaints at this time, I will add on a procalcitonin level, and defer decision making regarding need for additional antibiotics to the admitting hospitalist.   I have placed an order for observation to med/tele for further evaluation management of the above.  I have placed some additional preliminary admit orders via the adult multi-morbid admission order set. I have also ordered procalcitonin level, as above.    Newton Pigg, DO Hospitalist

## 2023-02-09 NOTE — ED Notes (Signed)
ED TO INPATIENT HANDOFF REPORT  ED Nurse Name and Phone #: [phone] Chaddrick Brue  S Name/Age/Gender Alexis Edwards 77 y.o. female Room/Bed: TRACC/TRACC  Code Status   Code Status: Prior  Home/SNF/Other Home Patient oriented to: self, place, time, and situation Is this baseline? Yes   Triage Complete: Triage complete  Chief Complaint syncope  Triage Note Patient arrived with EMS reports brief syncopal episode while at a bingo session this evening , no injury , alert and oriented at arrival , CBG= 186, respirations unlabored , patient articulated feeling lightheaded/dizzy .    Allergies Allergies  Allergen Reactions   Macrobid [Nitrofurantoin] Itching and Rash   Sulfa Antibiotics Itching and Rash    Level of Care/Admitting Diagnosis ED Disposition     ED Disposition  Admit   Condition  --   Comment  The patient appears reasonably stabilized for admission considering the current resources, flow, and capabilities available in the ED at this time, and I doubt any other [facility] requiring further screening and/or treatment in the ED prior to admission is  present.          B Medical/Surgery History Past Medical History:  Diagnosis Date   Arthritis    CHF (congestive heart failure) (HCC)    Claustrophobia    COPD (chronic obstructive pulmonary disease) (HCC)    Depression    Dyslipidemia    Dyspnea    GERD (gastroesophageal reflux disease)    Heart murmur    History of kidney stones    HTN (hypertension)    Hyperlipidemia    Meningioma (HCC) 11/14/2018   Obesity    BMI 55   Pneumonia    hx   Sleep apnea    does not use cpap   Sleep apnea    Takotsubo syndrome April 2013   apical ballooning, [facility]- [place]   Past Surgical History:  Procedure Laterality Date   ABDOMINAL HYSTERECTOMY     APPLICATION OF CRANIAL NAVIGATION N/A 12/12/2018   Procedure: APPLICATION OF CRANIAL NAVIGATION;  Surgeon: Jadene Pierini, MD;  Location: [facility] OR;  Service:  Neurosurgery;  Laterality: N/A;   CARDIAC CATHETERIZATION     CHOLECYSTECTOMY  2/10   CRANIOTOMY Left 12/12/2018   Procedure: Left Craniotomy for tumor resection with brainlab;  Surgeon: Jadene Pierini, MD;  Location: [facility] OR;  Service: Neurosurgery;  Laterality: Left;   KIDNEY STONE SURGERY     REVERSE SHOULDER ARTHROPLASTY Left 02/24/2017   Procedure: REVERSE LEFT SHOULDER ARTHROPLASTY;  Surgeon: Beverely Low, MD;  Location: [facility] OR;  Service: Orthopedics;  Laterality: Left;   TOTAL KNEE ARTHROPLASTY  5/12   Lt     A IV Location/Drains/Wounds Patient Lines/Drains/Airways Status     Active Line/Drains/Airways     Name Placement date Placement time Site Days   Peripheral IV 02/08/23 20 G Left Antecubital 02/08/23  --  Antecubital  1   Incision (Closed) 12/12/18 Head 12/12/18  1555  -- 1520   Pressure Injury 07/08/18 Stage I -  Intact skin with non-blanchable redness of a localized area usually over a bony prominence. 07/08/18  0000  -- 1677            Intake/Output Last 24 hours No intake or output data in the 24 hours ending 02/09/23 0530  Labs/Imaging Results for orders placed or performed during the hospital encounter of 02/08/23 (from the past 48 hour(s))  CBC with Differential     Status: Abnormal   Collection Time: 02/08/23 10:40 PM  Result Value Ref Range   WBC 9.8 4.0 - 10.5 K/uL   RBC 4.91 3.87 - 5.11 MIL/uL   Hemoglobin 16.5 (H) 12.0 - 15.0 g/dL   HCT 16.1 (H) 09.6 - 04.5 %   MCV 100.2 (H) 80.0 - 100.0 fL   MCH 33.6 26.0 - 34.0 pg   MCHC 33.5 30.0 - 36.0 g/dL   RDW 40.9 81.1 - 91.4 %   Platelets 229 150 - 400 K/uL   nRBC 0.0 0.0 - 0.2 %   Neutrophils Relative % 66 %   Neutro Abs 6.5 1.7 - 7.7 K/uL   Lymphocytes Relative 26 %   Lymphs Abs 2.5 0.7 - 4.0 K/uL   Monocytes Relative 6 %   Monocytes Absolute 0.6 0.1 - 1.0 K/uL   Eosinophils Relative 1 %   Eosinophils Absolute 0.1 0.0 - 0.5 K/uL   Basophils Relative 1 %   Basophils Absolute 0.1 0.0 - 0.1 K/uL    Immature Granulocytes 0 %   Abs Immature Granulocytes 0.02 0.00 - 0.07 K/uL    Comment: Performed at [facility], [address] [place], [place] [address]  Comprehensive metabolic panel     Status: Abnormal   Collection Time: 02/08/23 10:40 PM  Result Value Ref Range   Sodium 142 135 - 145 mmol/L   Potassium 4.0 3.5 - 5.1 mmol/L   Chloride 103 98 - 111 mmol/L   CO2 27 22 - 32 mmol/L   Glucose, Bld 174 (H) 70 - 99 mg/dL    Comment: Glucose reference range applies only to samples taken after fasting for at least 8 hours.   BUN 13 8 - 23 mg/dL   Creatinine, Ser 6.21 (H) 0.44 - 1.00 mg/dL   Calcium 9.3 8.9 - 30.8 mg/dL   Total Protein 6.7 6.5 - 8.1 g/dL   Albumin 3.6 3.5 - 5.0 g/dL   AST 18 15 - 41 U/L   ALT 11 0 - 44 U/L   Alkaline Phosphatase 78 38 - 126 U/L   Total Bilirubin 0.9 0.3 - 1.2 mg/dL   GFR, Estimated 44 (L) >60 mL/min    Comment: (NOTE) Calculated using the CKD-EPI Creatinine Equation (2021)    Anion gap 12 5 - 15    Comment: Performed at [facility], [address], [place], [place] [address]  Troponin I (High Sensitivity)     Status: Abnormal   Collection Time: 02/08/23 10:40 PM  Result Value Ref Range   Troponin I (High Sensitivity) 35 (H) <18 ng/L    Comment: (NOTE) Elevated high sensitivity troponin I (hsTnI) values and significant  changes across serial measurements may suggest ACS but many other  chronic and acute conditions are known to elevate hsTnI results.  Refer to the "Links" section for chest pain algorithms and additional  guidance. Performed at [facility], [address] [place], [place] [address]   Brain natriuretic peptide     Status: None   Collection Time: 02/08/23 10:40 PM  Result Value Ref Range   B Natriuretic Peptide 28.5 0.0 - 100.0 pg/mL    Comment: Performed at [facility], [address], [place], [place] [address]  Magnesium     Status: None   Collection Time: 02/08/23 10:40 PM  Result Value Ref  Range   Magnesium 1.9 1.7 - 2.4 mg/dL    Comment: Performed at [facility], [address], [place], [place] [address]  Troponin I (High Sensitivity)  Status: Abnormal   Collection Time: 02/09/23  1:15 AM  Result Value Ref Range   Troponin I (High Sensitivity) 35 (H) <18 ng/L    Comment: (NOTE) Elevated high sensitivity troponin I (hsTnI) values and significant  changes across serial measurements may suggest ACS but many other  chronic and acute conditions are known to elevate hsTnI results.  Refer to the "Links" section for chest pain algorithms and additional  guidance. Performed at [facility], [address], [place], [place] [address]    CT Angio Head W or Wo Contrast  Result Date: 02/09/2023 CLINICAL DATA:  Syncope EXAM: CT ANGIOGRAPHY HEAD TECHNIQUE: Multidetector CT imaging of the head was performed using the standard protocol during bolus administration of intravenous contrast. Multiplanar CT image reconstructions and MIPs were obtained to evaluate the vascular anatomy. RADIATION DOSE REDUCTION: This exam was performed according to the departmental dose-optimization program which includes automated exposure control, adjustment of the mA and/or kV according to patient size and/or use of iterative reconstruction technique. CONTRAST:  75mL OMNIPAQUE IOHEXOL 350 MG/ML SOLN COMPARISON:  07/07/2018 FINDINGS: CT HEAD Brain: There is no mass, hemorrhage or extra-axial collection. Unchanged left frontal encephalomalacia. There is periventricular hypoattenuation compatible with chronic microvascular disease. Vascular: No abnormal hyperdensity of the major intracranial arteries or dural venous sinuses. No intracranial atherosclerosis. Skull: Remote left frontal craniotomy. Sinuses/Orbits: No fluid levels or advanced mucosal thickening of the visualized paranasal sinuses. No mastoid or middle ear effusion. The orbits are normal. CTA HEAD POSTERIOR CIRCULATION: --Vertebral arteries:  Normal --Inferior cerebellar arteries: Normal. --Basilar artery: Normal. --Superior cerebellar arteries: Normal. --Posterior cerebral arteries: Normal. The right PCA is predominantly supplied by the posterior communicating artery. ANTERIOR CIRCULATION: --Intracranial internal carotid arteries: Normal. --Anterior cerebral arteries (ACA): Normal. --Middle cerebral arteries (MCA): Normal. Review of the MIP images confirms the above findings. IMPRESSION: 1. No emergent large vessel occlusion or high-grade stenosis of the intracranial arteries. 2. No intracranial aneurysm. 3. Unchanged left frontal encephalomalacia. Electronically Signed   By: Deatra Robinson M.D.   On: 02/09/2023 02:31   DG Chest 2 View  Result Date: 02/09/2023 CLINICAL DATA:  Right lung crackles EXAM: CHEST - 2 VIEW COMPARISON:  07/09/2018 FINDINGS: Mild patchy right lower lobe opacity, overlying the spine on the lateral view, suspicious for pneumonia. Left lung is clear. No pleural effusion or pneumothorax. The heart is top-normal in size. IMPRESSION: Mild patchy right lower lobe opacity, suspicious for pneumonia. Electronically Signed   By: Charline Bills M.D.   On: 02/09/2023 00:27    Pending Labs Unresulted Labs (From admission, onward)     Start     Ordered   02/08/23 2257  Urinalysis, Routine w reflex microscopic -Urine, Clean Catch  Once,   URGENT       Question:  Specimen Source  Answer:  Urine, Clean Catch   02/08/23 2256            Vitals/Pain Today's Vitals   02/09/23 0430 02/09/23 0445 02/09/23 0500 02/09/23 0515  BP: (!) 143/72 (!) 146/61 (!) 144/50 (!) 149/66  Pulse: 65 64 63 63  Resp: 18 20 14  (!) 26  Temp:      TempSrc:      SpO2: 100% 100% 100% 100%  PainSc:        Isolation Precautions No active isolations  Medications Medications  iohexol (OMNIPAQUE) 350 MG/ML injection 75 mL (75 mLs Intravenous Contrast Given 02/09/23 0216)  doxycycline (VIBRA-TABS) tablet 100 mg (100 mg Oral Given 02/09/23 0507)  Mobility walks     Focused Assessments Syncopy while sitting playing bingo remains dizzy with exertion   R Recommendations: See Admitting Provider Note  Report given to:   Additional Notes:  a/o x 4 ambulates with steady gait gets dizzy going from sitting to standing 20 g lac

## 2023-02-09 NOTE — ED Notes (Signed)
Oxygen sats while ambulating 97-98%

## 2023-02-09 NOTE — ED Provider Notes (Signed)
[place] EMERGENCY DEPARTMENT AT [facility] Provider Note   CSN: [id number] Arrival date & time: 02/08/23  2218     History  Chief Complaint  Patient presents with   Syncope    Alexis Edwards is a 77 y.o. female.  77 year old female with history of hyperlipidemia who presents ER today with lightheadedness.  Patient states that throughout the day today she has had multiple episodes of lightheadedness and she still has not.  She states she has a headache with it.  She states that is not associated with movement or any other particular activity.  She been eating and drinking okay recently.  No recent fevers, nausea, vomiting, diarrhea, constipation, cough or chills.  She does have decreased urine output and does seem to be darker than normal but does not burn.  No history of the same.  No associated chest pain, abdominal pain, back pain or trauma.        Home Medications Prior to Admission medications   Medication Sig Start Date End Date Taking? Authorizing Provider  acetaminophen (TYLENOL) 500 MG tablet Take 1,000 mg by mouth every 6 (six) hours as needed for headache (pain).   Yes [provider]  DULoxetine (CYMBALTA) 60 MG capsule Take 60 mg by mouth daily.   Yes [provider]  famotidine (PEPCID) 10 MG tablet Take 10 mg by mouth 2 (two) times daily.   Yes [provider]  gabapentin (NEURONTIN) 400 MG capsule Take 400 mg by mouth 3 (three) times daily.   Yes [provider]  levothyroxine (SYNTHROID) 50 MCG tablet Take 50 mcg by mouth daily before breakfast.   Yes [provider]  Oxycodone HCl 10 MG TABS Take 10 mg by mouth every 6 (six) hours as needed (pain).   Yes [provider]  rosuvastatin (CRESTOR) 20 MG tablet Take 20 mg by mouth daily.   Yes [provider]  solifenacin (VESICARE) 10 MG tablet Take 10 mg by mouth at bedtime.    Yes [provider]  furosemide (LASIX) 20 MG tablet  Take 1 tablet (20 mg total) by mouth daily. Patient not taking: Reported on 02/09/2023 07/08/22   Maisie Fus, MD  gabapentin (NEURONTIN) 100 MG capsule Take 400 mg by mouth 3 (three) times daily. Patient not taking: Reported on 02/09/2023    [provider]  Multiple Vitamin (MULTIVITAMIN WITH MINERALS) TABS tablet Take 1 tablet by mouth daily. Patient not taking: Reported on 02/09/2023    [provider]      Allergies    Macrobid [nitrofurantoin] and Sulfa antibiotics    Review of Systems   Review of Systems  Physical Exam Updated Vital Signs BP (!) 156/85 (BP Location: Left Arm)   Pulse 67   Temp 98.3 F (36.8 C) (Oral)   Resp 18   Ht 4\' 11"  (1.499 m)   Wt 124.6 kg   LMP  (LMP Unknown)   SpO2 93%   BMI 55.48 kg/m  Physical Exam Vitals and nursing note reviewed.  HENT:     Mouth/Throat:     Mouth: Mucous membranes are dry.  Cardiovascular:     Rate and Rhythm: Normal rate.  Pulmonary:     Breath sounds: Rales (right lung fields) present.  Abdominal:     General: Abdomen is flat.  Skin:    General: Skin is warm.  Neurological:     General: No focal deficit present.     ED Results / Procedures /  Treatments   Labs (all labs ordered are listed, but only abnormal results are displayed) Labs Reviewed  CBC WITH DIFFERENTIAL/PLATELET - Abnormal; Notable for the following components:      Result Value   Hemoglobin 16.5 (*)    HCT 49.2 (*)    MCV 100.2 (*)    All other components within normal limits  COMPREHENSIVE METABOLIC PANEL - Abnormal; Notable for the following components:   Glucose, Bld 174 (*)    Creatinine, Ser 1.25 (*)    GFR, Estimated 44 (*)    All other components within normal limits  URINALYSIS, ROUTINE W REFLEX MICROSCOPIC - Abnormal; Notable for the following components:   Leukocytes,Ua TRACE (*)    All other components within normal limits  URINALYSIS, MICROSCOPIC (REFLEX) - Abnormal; Notable for the following components:    Bacteria, UA RARE (*)    All other components within normal limits  D-DIMER, QUANTITATIVE - Abnormal; Notable for the following components:   D-Dimer, Quant 0.51 (*)    All other components within normal limits  TSH - Abnormal; Notable for the following components:   TSH 8.543 (*)    All other components within normal limits  CBC - Abnormal; Notable for the following components:   Hemoglobin 15.1 (*)    MCH 34.1 (*)    All other components within normal limits  BASIC METABOLIC PANEL - Abnormal; Notable for the following components:   Glucose, Bld 110 (*)    Creatinine, Ser 1.37 (*)    Calcium 8.4 (*)    GFR, Estimated 40 (*)    All other components within normal limits  GLUCOSE, CAPILLARY - Abnormal; Notable for the following components:   Glucose-Capillary 160 (*)    All other components within normal limits  TROPONIN I (HIGH SENSITIVITY) - Abnormal; Notable for the following components:   Troponin I (High Sensitivity) 35 (*)    All other components within normal limits  TROPONIN I (HIGH SENSITIVITY) - Abnormal; Notable for the following components:   Troponin I (High Sensitivity) 35 (*)    All other components within normal limits  BRAIN NATRIURETIC PEPTIDE  MAGNESIUM  PROCALCITONIN  T4, FREE    EKG EKG Interpretation  Date/Time:  Wednesday February 08 2023 22:35:11 EDT Ventricular Rate:  65 PR Interval:  174 QRS Duration: 94 QT Interval:  460 QTC Calculation: 478 R Axis:   16 Text Interpretation: Normal sinus rhythm Normal ECG When compared with ECG of 11-Dec-2018 10:41, PREVIOUS ECG IS PRESENT Confirmed by Marily Memos [phone]) on 02/08/2023 11:46:29 PM  Radiology CT Angio Head W or Wo Contrast  Result Date: 02/09/2023 CLINICAL DATA:  Syncope EXAM: CT ANGIOGRAPHY HEAD TECHNIQUE: Multidetector CT imaging of the head was performed using the standard protocol during bolus administration of intravenous contrast. Multiplanar CT image reconstructions and MIPs were obtained  to evaluate the vascular anatomy. RADIATION DOSE REDUCTION: This exam was performed according to the departmental dose-optimization program which includes automated exposure control, adjustment of the mA and/or kV according to patient size and/or use of iterative reconstruction technique. CONTRAST:  75mL OMNIPAQUE IOHEXOL 350 MG/ML SOLN COMPARISON:  07/07/2018 FINDINGS: CT HEAD Brain: There is no mass, hemorrhage or extra-axial collection. Unchanged left frontal encephalomalacia. There is periventricular hypoattenuation compatible with chronic microvascular disease. Vascular: No abnormal hyperdensity of the major intracranial arteries or dural venous sinuses. No intracranial atherosclerosis. Skull: Remote left frontal craniotomy. Sinuses/Orbits: No fluid levels or advanced mucosal thickening of the visualized paranasal sinuses. No mastoid or middle ear effusion.  The orbits are normal. CTA HEAD POSTERIOR CIRCULATION: --Vertebral arteries: Normal --Inferior cerebellar arteries: Normal. --Basilar artery: Normal. --Superior cerebellar arteries: Normal. --Posterior cerebral arteries: Normal. The right PCA is predominantly supplied by the posterior communicating artery. ANTERIOR CIRCULATION: --Intracranial internal carotid arteries: Normal. --Anterior cerebral arteries (ACA): Normal. --Middle cerebral arteries (MCA): Normal. Review of the MIP images confirms the above findings. IMPRESSION: 1. No emergent large vessel occlusion or high-grade stenosis of the intracranial arteries. 2. No intracranial aneurysm. 3. Unchanged left frontal encephalomalacia. Electronically Signed   By: Deatra Robinson M.D.   On: 02/09/2023 02:31   DG Chest 2 View  Result Date: 02/09/2023 CLINICAL DATA:  Right lung crackles EXAM: CHEST - 2 VIEW COMPARISON:  07/09/2018 FINDINGS: Mild patchy right lower lobe opacity, overlying the spine on the lateral view, suspicious for pneumonia. Left lung is clear. No pleural effusion or pneumothorax. The  heart is top-normal in size. IMPRESSION: Mild patchy right lower lobe opacity, suspicious for pneumonia. Electronically Signed   By: Charline Bills M.D.   On: 02/09/2023 00:27    Procedures Procedures    Medications Ordered in ED Medications  acetaminophen (TYLENOL) tablet 650 mg (has no administration in time range)    Or  acetaminophen (TYLENOL) suppository 650 mg (has no administration in time range)  enoxaparin (LOVENOX) injection 40 mg (40 mg Subcutaneous Given 02/09/23 0943)  oxyCODONE (Oxy IR/ROXICODONE) immediate release tablet 10 mg (10 mg Oral Given 02/09/23 1427)  sodium chloride flush (NS) 0.9 % injection 3 mL (3 mLs Intravenous Not Given 02/09/23 2236)  ondansetron (ZOFRAN) tablet 4 mg (has no administration in time range)    Or  ondansetron (ZOFRAN) injection 4 mg (has no administration in time range)  doxycycline (VIBRA-TABS) tablet 100 mg (100 mg Oral Given 02/09/23 2236)  0.9 %  sodium chloride infusion ( Intravenous New Bag/Given 02/09/23 1859)  rosuvastatin (CRESTOR) tablet 20 mg (20 mg Oral Given 02/09/23 1852)  DULoxetine (CYMBALTA) DR capsule 60 mg (60 mg Oral Given 02/09/23 1852)  fesoterodine (TOVIAZ) tablet 4 mg (4 mg Oral Given 02/09/23 2236)  gabapentin (NEURONTIN) capsule 400 mg (400 mg Oral Given 02/09/23 2235)  iohexol (OMNIPAQUE) 350 MG/ML injection 75 mL (75 mLs Intravenous Contrast Given 02/09/23 0216)  doxycycline (VIBRA-TABS) tablet 100 mg (100 mg Oral Given 02/09/23 0507)    ED Course/ Medical Decision Making/ A&P Clinical Course as of 02/10/23 0443  Wed Feb 08, 2023  2343 Hemoglobin(!): 16.5 hemoconcentrated [JM]  2344 Creatinine(!): 1.25 Dehydration? [JM]    Clinical Course User Index [JM] Gregrey Bloyd, Barbara Cower, MD                             Medical Decision Making Amount and/or Complexity of Data Reviewed Labs: ordered. Decision-making details documented in ED Course. Radiology: ordered.  Risk Prescription drug management. Decision regarding  hospitalization.   Patient's syncopal/near syncopal episode most likely related to dehydration however she has a mildly elevated troponin and an new T wave inversion in aVL on her EKG.  She feels better is able to ambulate but still feeling weak.  Will plan for hospital admission for monitoring and consideration of echocardiogram in the setting of high risk syncope.  Final Clinical Impression(s) / ED Diagnoses Final diagnoses:  Near syncope    Rx / DC Orders ED Discharge Orders     None         Malaya Cagley, Barbara Cower, MD 02/10/23 [phone]

## 2023-02-09 NOTE — ED Notes (Signed)
ED TO INPATIENT HANDOFF REPORT  ED Nurse Name and Phone #:  Theophilus Bones [phone]  S Name/Age/Gender Alexis Edwards 77 y.o. female Room/Bed: 039C/039C  Code Status   Code Status: Full Code  Home/SNF/Other Home Patient oriented to: self, place, time, and situation Is this baseline? Yes   Triage Complete: Triage complete  Chief Complaint Syncope [R55]  Triage Note Patient arrived with EMS reports brief syncopal episode while at a bingo session this evening , no injury , alert and oriented at arrival , CBG= 186, respirations unlabored , patient articulated feeling lightheaded/dizzy .    Allergies Allergies  Allergen Reactions   Macrobid [Nitrofurantoin] Itching and Rash   Sulfa Antibiotics Itching and Rash    Level of Care/Admitting Diagnosis ED Disposition     ED Disposition  Admit   Condition  --   Comment  Hospital Area: [facility] [[id number]]  Level of Care: Telemetry Medical [104]  May place patient in observation at [facility] or [facility] if equivalent level of care is available:: No  Covid Evaluation: Asymptomatic - no recent exposure (last 10 days) testing not required  Diagnosis: Syncope [206001]  Admitting Physician: Angie Fava [[id number]]  Attending Physician: Angie Fava [[id number]]          B Medical/Surgery History Past Medical History:  Diagnosis Date   Arthritis    CHF (congestive heart failure)    Claustrophobia    COPD (chronic obstructive pulmonary disease)    Depression    Dyslipidemia    Dyspnea    GERD (gastroesophageal reflux disease)    Heart murmur    History of kidney stones    HTN (hypertension)    Hyperlipidemia    Meningioma 11/14/2018   Obesity    BMI 55   Pneumonia    hx   Sleep apnea    does not use cpap   Sleep apnea    Takotsubo syndrome April 2013   apical ballooning, [facility] [place]   Past Surgical History:  Procedure Laterality Date   ABDOMINAL HYSTERECTOMY      APPLICATION OF CRANIAL NAVIGATION N/A 12/12/2018   Procedure: APPLICATION OF CRANIAL NAVIGATION;  Surgeon: Jadene Pierini, MD;  Location: [facility] OR;  Service: Neurosurgery;  Laterality: N/A;   CARDIAC CATHETERIZATION     CHOLECYSTECTOMY  2/10   CRANIOTOMY Left 12/12/2018   Procedure: Left Craniotomy for tumor resection with brainlab;  Surgeon: Jadene Pierini, MD;  Location[facility] OR;  Service: Neurosurgery;  Laterality: Left;   KIDNEY STONE SURGERY     REVERSE SHOULDER ARTHROPLASTY Left 02/24/2017   Procedure: REVERSE LEFT SHOULDER ARTHROPLASTY;  Surgeon: Beverely Low, MD;  Location: [facility] OR;  Service: Orthopedics;  Laterality: Left;   TOTAL KNEE ARTHROPLASTY  5/12   Lt     A IV Location/Drains/Wounds Patient Lines/Drains/Airways Status     Active Line/Drains/Airways     Name Placement date Placement time Site Days   Peripheral IV 02/08/23 20 G Left Antecubital 02/08/23  --  Antecubital  1   Pressure Injury 07/08/18 Stage I -  Intact skin with non-blanchable redness of a localized area usually over a bony prominence. 07/08/18  0000  -- 1677            Intake/Output Last 24 hours No intake or output data in the 24 hours ending 02/09/23 9562  Labs/Imaging Results for orders placed or performed during the hospital encounter of 02/08/23 (from the past 48 hour(s))  CBC with Differential  Status: Abnormal   Collection Time: 02/08/23 10:40 PM  Result Value Ref Range   WBC 9.8 4.0 - 10.5 K/uL   RBC 4.91 3.87 - 5.11 MIL/uL   Hemoglobin 16.5 (H) 12.0 - 15.0 g/dL   HCT 25.3 (H) 66.4 - 40.3 %   MCV 100.2 (H) 80.0 - 100.0 fL   MCH 33.6 26.0 - 34.0 pg   MCHC 33.5 30.0 - 36.0 g/dL   RDW 47.4 25.9 - 56.3 %   Platelets 229 150 - 400 K/uL   nRBC 0.0 0.0 - 0.2 %   Neutrophils Relative % 66 %   Neutro Abs 6.5 1.7 - 7.7 K/uL   Lymphocytes Relative 26 %   Lymphs Abs 2.5 0.7 - 4.0 K/uL   Monocytes Relative 6 %   Monocytes Absolute 0.6 0.1 - 1.0 K/uL   Eosinophils Relative 1 %    Eosinophils Absolute 0.1 0.0 - 0.5 K/uL   Basophils Relative 1 %   Basophils Absolute 0.1 0.0 - 0.1 K/uL   Immature Granulocytes 0 %   Abs Immature Granulocytes 0.02 0.00 - 0.07 K/uL    Comment: Performed at [facility], [address] [place], [place] [address]  Comprehensive metabolic panel     Status: Abnormal   Collection Time: 02/08/23 10:40 PM  Result Value Ref Range   Sodium 142 135 - 145 mmol/L   Potassium 4.0 3.5 - 5.1 mmol/L   Chloride 103 98 - 111 mmol/L   CO2 27 22 - 32 mmol/L   Glucose, Bld 174 (H) 70 - 99 mg/dL    Comment: Glucose reference range applies only to samples taken after fasting for at least 8 hours.   BUN 13 8 - 23 mg/dL   Creatinine, Ser 3.32 (H) 0.44 - 1.00 mg/dL   Calcium 9.3 8.9 - 95.1 mg/dL   Total Protein 6.7 6.5 - 8.1 g/dL   Albumin 3.6 3.5 - 5.0 g/dL   AST 18 15 - 41 U/L   ALT 11 0 - 44 U/L   Alkaline Phosphatase 78 38 - 126 U/L   Total Bilirubin 0.9 0.3 - 1.2 mg/dL   GFR, Estimated 44 (L) >60 mL/min    Comment: (NOTE) Calculated using the CKD-EPI Creatinine Equation (2021)    Anion gap 12 5 - 15    Comment: Performed at [facility], [address], [place], [place] [address]  Troponin I (High Sensitivity)     Status: Abnormal   Collection Time: 02/08/23 10:40 PM  Result Value Ref Range   Troponin I (High Sensitivity) 35 (H) <18 ng/L    Comment: (NOTE) Elevated high sensitivity troponin I (hsTnI) values and significant  changes across serial measurements may suggest ACS but many other  chronic and acute conditions are known to elevate hsTnI results.  Refer to the "Links" section for chest pain algorithms and additional  guidance. Performed at [facility], [address], [place], [place] [address]   Brain natriuretic peptide     Status: None   Collection Time: 02/08/23 10:40 PM  Result Value Ref Range   B Natriuretic Peptide 28.5 0.0 - 100.0 pg/mL    Comment: Performed at [facility], [address],  [place], [place] [address]  Magnesium     Status: None   Collection Time: 02/08/23 10:40 PM  Result Value Ref Range   Magnesium 1.9 1.7 - 2.4 mg/dL    Comment: Performed at [facility], [address] [place], [place] [address]  Troponin I (High Sensitivity)     Status: Abnormal   Collection Time: 02/09/23  1:15 AM  Result Value Ref Range   Troponin I (High Sensitivity) 35 (H) <18 ng/L    Comment: (NOTE) Elevated high sensitivity troponin I (hsTnI) values and significant  changes across serial measurements may suggest ACS but many other  chronic and acute conditions are known to elevate hsTnI results.  Refer to the "Links" section for chest pain algorithms and additional  guidance. Performed at [facility], [address], [place], [place] [address]   Procalcitonin     Status: None   Collection Time: 02/09/23  1:15 AM  Result Value Ref Range   Procalcitonin <0.10 ng/mL    Comment:        Interpretation: PCT (Procalcitonin) <= 0.5 ng/mL: Systemic infection (sepsis) is not likely. Local bacterial infection is possible. (NOTE)       Sepsis PCT Algorithm           Lower Respiratory Tract                                      Infection PCT Algorithm    ----------------------------     ----------------------------         PCT < 0.25 ng/mL                PCT < 0.10 ng/mL          Strongly encourage             Strongly discourage   discontinuation of antibiotics    initiation of antibiotics    ----------------------------     -----------------------------       PCT 0.25 - 0.50 ng/mL            PCT 0.10 - 0.25 ng/mL               OR       >80% decrease in PCT            Discourage initiation of                                            antibiotics      Encourage discontinuation           of antibiotics    ----------------------------     -----------------------------         PCT >= 0.50 ng/mL              PCT 0.26 - 0.50 ng/mL               AND        <80% decrease in  PCT             Encourage initiation of                                             antibiotics       Encourage continuation           of antibiotics    ----------------------------     -----------------------------        PCT >= 0.50 ng/mL  PCT > 0.50 ng/mL               AND         increase in PCT                  Strongly encourage                                      initiation of antibiotics    Strongly encourage escalation           of antibiotics                                     -----------------------------                                           PCT <= 0.25 ng/mL                                                 OR                                        > 80% decrease in PCT                                      Discontinue / Do not initiate                                             antibiotics  Performed at [facility], [address] [place], [place] [address]   Urinalysis, Routine w reflex microscopic -Urine, Clean Catch     Status: Abnormal   Collection Time: 02/09/23  5:58 AM  Result Value Ref Range   Color, Urine YELLOW YELLOW   APPearance CLEAR CLEAR   Specific Gravity, Urine 1.025 1.005 - 1.030   pH 5.5 5.0 - 8.0   Glucose, UA NEGATIVE NEGATIVE mg/dL   Hgb urine dipstick NEGATIVE NEGATIVE   Bilirubin Urine NEGATIVE NEGATIVE   Ketones, ur NEGATIVE NEGATIVE mg/dL   Protein, ur NEGATIVE NEGATIVE mg/dL   Nitrite NEGATIVE NEGATIVE   Leukocytes,Ua TRACE (A) NEGATIVE    Comment: Performed at [facility], [address] [place], [place] [address]  Urinalysis, Microscopic (reflex)     Status: Abnormal   Collection Time: 02/09/23  5:58 AM  Result Value Ref Range   RBC / HPF NONE SEEN 0 - 5 RBC/hpf   WBC, UA 0-5 0 - 5 WBC/hpf   Bacteria, UA RARE (A) NONE SEEN   Squamous Epithelial / HPF 0-5 0 - 5 /HPF    Comment: Performed at [facility], [address], [place], [place] [address]   CT Angio Head W or [place] Contrast  Result  Date: 02/09/2023 CLINICAL DATA:  Syncope EXAM:  CT ANGIOGRAPHY HEAD TECHNIQUE: Multidetector CT imaging of the head was performed using the standard protocol during bolus administration of intravenous contrast. Multiplanar CT image reconstructions and MIPs were obtained to evaluate the vascular anatomy. RADIATION DOSE REDUCTION: This exam was performed according to the departmental dose-optimization program which includes automated exposure control, adjustment of the mA and/or kV according to patient size and/or use of iterative reconstruction technique. CONTRAST:  14mL OMNIPAQUE IOHEXOL 350 MG/ML SOLN COMPARISON:  07/07/2018 FINDINGS: CT HEAD Brain: There is no mass, hemorrhage or extra-axial collection. Unchanged left frontal encephalomalacia. There is periventricular hypoattenuation compatible with chronic microvascular disease. Vascular: No abnormal hyperdensity of the major intracranial arteries or dural venous sinuses. No intracranial atherosclerosis. Skull: Remote left frontal craniotomy. Sinuses/Orbits: No fluid levels or advanced mucosal thickening of the visualized paranasal sinuses. No mastoid or middle ear effusion. The orbits are normal. CTA HEAD POSTERIOR CIRCULATION: --Vertebral arteries: Normal --Inferior cerebellar arteries: Normal. --Basilar artery: Normal. --Superior cerebellar arteries: Normal. --Posterior cerebral arteries: Normal. The right PCA is predominantly supplied by the posterior communicating artery. ANTERIOR CIRCULATION: --Intracranial internal carotid arteries: Normal. --Anterior cerebral arteries (ACA): Normal. --Middle cerebral arteries (MCA): Normal. Review of the MIP images confirms the above findings. IMPRESSION: 1. No emergent large vessel occlusion or high-grade stenosis of the intracranial arteries. 2. No intracranial aneurysm. 3. Unchanged left frontal encephalomalacia. Electronically Signed   By: Deatra Robinson M.D.   On: 02/09/2023 02:31   DG Chest 2 View  Result Date:  02/09/2023 CLINICAL DATA:  Right lung crackles EXAM: CHEST - 2 VIEW COMPARISON:  07/09/2018 FINDINGS: Mild patchy right lower lobe opacity, overlying the spine on the lateral view, suspicious for pneumonia. Left lung is clear. No pleural effusion or pneumothorax. The heart is top-normal in size. IMPRESSION: Mild patchy right lower lobe opacity, suspicious for pneumonia. Electronically Signed   By: Charline Bills M.D.   On: 02/09/2023 00:27    Pending Labs Unresulted Labs (From admission, onward)     Start     Ordered   02/10/23 0500  CBC  Tomorrow morning,   R        02/09/23 0903   02/10/23 0500  Basic metabolic panel  Tomorrow morning,   R        02/09/23 0903   02/09/23 0903  TSH  Once,   R        02/09/23 0903   02/09/23 0900  D-dimer, quantitative  Once,   R        02/09/23 0859            Vitals/Pain Today's Vitals   02/09/23 0715 02/09/23 0730 02/09/23 0800 02/09/23 0900  BP: 135/60 (!) 136/56 (!) 119/59 (!) 113/58  Pulse: 60 60 63 62  Resp: 12 13 20 18   Temp:      TempSrc:      SpO2: 94% 94% 90% 91%  PainSc:        Isolation Precautions No active isolations  Medications Medications  acetaminophen (TYLENOL) tablet 650 mg (has no administration in time range)    Or  acetaminophen (TYLENOL) suppository 650 mg (has no administration in time range)  enoxaparin (LOVENOX) injection 40 mg (40 mg Subcutaneous Given 02/09/23 0943)  oxyCODONE (Oxy IR/ROXICODONE) immediate release tablet 10 mg (has no administration in time range)  sodium chloride flush (NS) 0.9 % injection 3 mL (3 mLs Intravenous Given 02/09/23 0948)  ondansetron (ZOFRAN) tablet 4 mg (has no administration in time range)    Or  ondansetron (  ZOFRAN) injection 4 mg (has no administration in time range)  iohexol (OMNIPAQUE) 350 MG/ML injection 75 mL (75 mLs Intravenous Contrast Given 02/09/23 0216)  doxycycline (VIBRA-TABS) tablet 100 mg (100 mg Oral Given 02/09/23 0507)    Mobility walks     Focused  Assessments Cardiac Assessment Handoff:  Cardiac Rhythm: Sinus bradycardia Lab Results  Component Value Date   TROPONINI <0.03 07/09/2018   No results found for: "DDIMER" Does the Patient currently have chest pain? No    R Recommendations: See Admitting Provider Note  Report given to:   Additional Notes:

## 2023-02-10 ENCOUNTER — Observation Stay (HOSPITAL_BASED_OUTPATIENT_CLINIC_OR_DEPARTMENT_OTHER): Payer: Medicare PPO

## 2023-02-10 ENCOUNTER — Observation Stay (HOSPITAL_COMMUNITY): Payer: Medicare PPO

## 2023-02-10 DIAGNOSIS — R55 Syncope and collapse: Secondary | ICD-10-CM

## 2023-02-10 DIAGNOSIS — R42 Dizziness and giddiness: Secondary | ICD-10-CM | POA: Diagnosis not present

## 2023-02-10 LAB — ECHOCARDIOGRAM COMPLETE
AV Mean grad: 14.5 mmHg
AV Peak grad: 22.1 mmHg
Ao pk vel: 2.35 m/s
Area-P 1/2: 3.28 cm2
Calc EF: 72.8 %
Height: 59 in
S' Lateral: 2.2 cm
Single Plane A2C EF: 76.7 %
Single Plane A4C EF: 68.5 %
Weight: 4395.09 oz

## 2023-02-10 LAB — BASIC METABOLIC PANEL
Anion gap: 6 (ref 5–15)
BUN: 12 mg/dL (ref 8–23)
CO2: 27 mmol/L (ref 22–32)
Calcium: 8.4 mg/dL — ABNORMAL LOW (ref 8.9–10.3)
Chloride: 105 mmol/L (ref 98–111)
Creatinine, Ser: 1.37 mg/dL — ABNORMAL HIGH (ref 0.44–1.00)
GFR, Estimated: 40 mL/min — ABNORMAL LOW (ref >60)
Glucose, Bld: 110 mg/dL — ABNORMAL HIGH (ref 70–99)
Potassium: 5.1 mmol/L (ref 3.5–5.1)
Sodium: 138 mmol/L (ref 135–145)

## 2023-02-10 LAB — CBC
HCT: 44 % (ref 36.0–46.0)
Hemoglobin: 15.1 g/dL — ABNORMAL HIGH (ref 12.0–15.0)
MCH: 34.1 pg — ABNORMAL HIGH (ref 26.0–34.0)
MCHC: 34.3 g/dL (ref 30.0–36.0)
MCV: 99.3 fL (ref 80.0–100.0)
Platelets: 211 10*3/uL (ref 150–400)
RBC: 4.43 MIL/uL (ref 3.87–5.11)
RDW: 12.3 % (ref 11.5–15.5)
WBC: 8 10*3/uL (ref 4.0–10.5)
nRBC: 0 % (ref 0.0–0.2)

## 2023-02-10 MED ORDER — FAMOTIDINE 20 MG PO TABS
10.0000 mg | ORAL_TABLET | Freq: Two times a day (BID) | ORAL | Status: DC
  Administered 2023-02-10 – 2023-02-13 (×7): 10 mg via ORAL
  Filled 2023-02-10 (×7): qty 1

## 2023-02-10 MED ORDER — DIAZEPAM 5 MG/ML IJ SOLN
2.5000 mg | Freq: Once | INTRAMUSCULAR | Status: AC
  Administered 2023-02-10: 2.5 mg via INTRAVENOUS
  Filled 2023-02-10: qty 2

## 2023-02-10 MED ORDER — LEVOTHYROXINE SODIUM 50 MCG PO TABS
50.0000 ug | ORAL_TABLET | Freq: Every day | ORAL | Status: DC
  Administered 2023-02-10 – 2023-02-13 (×4): 50 ug via ORAL
  Filled 2023-02-10 (×4): qty 1

## 2023-02-10 NOTE — Progress Notes (Signed)
Echocardiogram 2D Echocardiogram has been performed.  Toni Amend 02/10/2023, 11:52 AM

## 2023-02-10 NOTE — Progress Notes (Signed)
Physical Therapy Evaluation Patient Details Name: Alexis Edwards MRN: [id number] DOB: [birth year] Today's Date: 02/10/2023  History of Present Illness  77 yo female with onset of syncopal episode during bingo game was admitted 4/17, referred to rehab to manage mobility.  Pt reports light headed feelings, HA and did forget syncope had happened when she passed out. Pt has elevated troponin, declining memory, polycythemia, but no clear source for issues other than dehydration.   PMHx:  OSA, Takotsubo syndrome, COPD, chronic pain, obesity, HTN, HLD, CHF,  Clinical Impression  Pt was seen for progression to walk from bed and monitor her vitals as follow:  supine 141/70 (90) pulse 73; sitting 153/83 (106) pulse 84 and sat 95%; standing 147/84 (101) pulse 86, sat 96%; post walk 159/79 (104) sat 96% and pulse 79.  Pt is light headed walking on trip from BR back to bed, SOB and not dropping sats, elevating HR or demonstrating BP drop.  Her plan is to ask for a short rehab stay to get her strength and control of gait back to a more controlled level, and will work on home coverage for help as well with this time.  Pt is clearly accustomed to being independent but has memory changes per family member.  Follow for goals of acute PT as are outlined on POC below.       Recommendations for follow up therapy are one component of a multi-disciplinary discharge planning process, led by the attending physician.  Recommendations may be updated based on patient status, additional functional criteria and insurance authorization.  Follow Up Recommendations Can patient physically be transported by private vehicle: No     Assistance Recommended at Discharge Frequent or constant Supervision/Assistance  Patient can return home with the following  A little help with walking and/or transfers;A little help with bathing/dressing/bathroom;Assistance with cooking/housework;Direct supervision/assist for medications  management;Direct supervision/assist for financial management;Assist for transportation;Help with stairs or ramp for entrance    Equipment Recommendations None recommended by PT  Recommendations for Other Services       Functional Status Assessment Patient has had a recent decline in their functional status and demonstrates the ability to make significant improvements in function in a reasonable and predictable amount of time.     Precautions / Restrictions Precautions Precautions: Fall Precaution Comments: monitor O2 sats and HR Restrictions Weight Bearing Restrictions: No      Mobility  Bed Mobility Overal bed mobility: Needs Assistance Bed Mobility: Supine to Sit, Sit to Supine     Supine to sit: Min guard Sit to supine: Min assist   General bed mobility comments: min assist to return to bed only for RLE briefly    Transfers Overall transfer level: Needs assistance Equipment used: Rolling walker (2 wheels), 1 person hand held assist Transfers: Sit to/from Stand Sit to Stand: Min assist           General transfer comment: reminders for hand placement only    Ambulation/Gait Ambulation/Gait assistance: Min guard Gait Distance (Feet): 30 Feet (15 x 2) Assistive device:  (dinamap) Gait Pattern/deviations: Step-through pattern, Decreased stride length, Wide base of support Gait velocity: reduced Gait velocity interpretation: <1.31 ft/sec, indicative of household ambulator Pre-gait activities: standing BP check General Gait Details: slow pace with wide based gait due to body habitus  Stairs            Wheelchair Mobility    Modified Rankin (Stroke Patients Only)       Balance Overall balance assessment: Needs assistance  Sitting-balance support: Feet supported Sitting balance-Leahy Scale: Fair     Standing balance support: Bilateral upper extremity supported, During functional activity Standing balance-Leahy Scale: Fair Standing balance comment:  less than fair dynamically                             Pertinent Vitals/Pain      Home Living Family/patient expects to be discharged to:: Private residence Living Arrangements: Alone Available Help at Discharge: Friend(s);Available 24 hours/day Type of Home: House Home Access: Stairs to enter Entrance Stairs-Rails: Doctor, general practice of Steps: 4   Home Layout: One level Home Equipment: Cane - single point;Rollator (4 wheels);Grab bars - toilet;Adaptive equipment;Grab bars - tub/shower Additional Comments: assisted to don socks but pt does not wear them    Prior Function Prior Level of Function : Independent/Modified Independent             Mobility Comments: uses RW ADLs Comments: Ind with ADLs/selfcare, IADLs, home mgt, was cooking and drives     Hand Dominance   Dominant Hand: Right    Extremity/Trunk Assessment   Upper Extremity Assessment Upper Extremity Assessment: Defer to OT evaluation;Generalized weakness    Lower Extremity Assessment Lower Extremity Assessment: Generalized weakness    Cervical / Trunk Assessment Cervical / Trunk Assessment: Kyphotic (mild)  Communication   Communication: No difficulties  Cognition                                                General Comments General comments (skin integrity, edema, etc.): Pt was seen for progression of mobility to walk to BR and then for return to bed.  Pt is light headed on second half of walk with no drops in BP, sats or fluctuations of HR    Exercises     Assessment/Plan    PT Assessment Patient needs continued PT services  PT Problem List Decreased strength;Decreased activity tolerance;Decreased balance;Decreased mobility;Decreased coordination;Obesity       PT Treatment Interventions DME instruction;Gait training;Functional mobility training;Therapeutic activities;Therapeutic exercise;Balance training;Neuromuscular  re-education;Patient/family education    PT Goals (Current goals can be found in the Care Plan section)  Acute Rehab PT Goals Patient Stated Goal: to go home with independence PT Goal Formulation: With patient/family Time For Goal Achievement: 02/24/23 Potential to Achieve Goals: Good    Frequency Min 3X/week     Co-evaluation PT/OT/SLP Co-Evaluation/Treatment: Yes Reason for Co-Treatment: For patient/therapist safety;To address functional/ADL transfers PT goals addressed during session: Mobility/safety with mobility;Balance;Proper use of DME OT goals addressed during session: ADL's and self-care;Proper use of Adaptive equipment and DME       AM-PAC PT "6 Clicks" Mobility  Outcome Measure Help needed turning from your back to your side while in a flat bed without using bedrails?: A Little Help needed moving from lying on your back to sitting on the side of a flat bed without using bedrails?: A Little Help needed moving to and from a bed to a chair (including a wheelchair)?: A Little Help needed standing up from a chair using your arms (e.g., wheelchair or bedside chair)?: A Little Help needed to walk in hospital room?: A Little Help needed climbing 3-5 steps with a railing? : Total 6 Click Score: 16    End of Session   Activity Tolerance: Treatment limited secondary to medical  complications (Comment);Patient tolerated treatment well (light headed feeling) Patient left: in bed;with call bell/phone within reach;with family/visitor present (declined bed alarm) Nurse Communication: Mobility status;Other (comment) (vitals) PT Visit Diagnosis: Unsteadiness on feet (R26.81);Muscle weakness (generalized) (M62.81);Other abnormalities of gait and mobility (R26.89);Dizziness and giddiness (R42)    Time: 1610-9604 PT Time Calculation (min) (ACUTE ONLY): 25 min   Charges:   PT Evaluation $PT Eval Moderate Complexity: 1 Mod         Ivar Drape 02/10/2023, 1:46 PM  Samul Dada, PT  PhD Acute Rehab Dept. Number: [facility] [id number] and [facility] [phone]

## 2023-02-10 NOTE — TOC Initial Note (Signed)
Transition of Care [facility]) - Initial/Assessment Note    Patient Details  Name: Alexis Edwards MRN: [id number] Date of Birth: 12/20/1945  Transition of Care [facility]) CM/SW Contact:    Lockie Pares, RN Phone Number: 02/10/2023, 10:58 AM  Clinical Narrative:                 77 year old patient lives by self presented with syncope while at bingo. . Family does not live with patient but checks on her. They would like home health to have another layer of observation and maintain her independence. Would recommend RN for disease /medication management, PT,  and social work. Spoke to Mrs Mones via phone. She states her daughter calls her every day. She would be accepting of Home health. She has had home health in the past, however she cannot remember the name of the agency, but she would like them back, would prefer to work with insurance for [facility].   Expected Discharge Plan: Home w Home Health Services Barriers to Discharge: Continued Medical Work up   Patient Goals and CMS Choice            Expected Discharge Plan and Services   Discharge Planning Services: CM Consult   Living arrangements for the past 2 months: Single Family Home                           HH Arranged: RN, Nurse's Aide, Social Work          Prior Living Arrangements/Services Living arrangements for the past 2 months: Single Family Home Lives with:: Self Patient language and need for interpreter reviewed:: Yes        Need for Family Participation in Patient Care: Yes (Comment) Care giver support system in place?: Yes (comment)   Criminal Activity/Legal Involvement Pertinent to Current Situation/Hospitalization: No - Comment as needed  Activities of Daily Living Home Assistive Devices/Equipment: Environmental consultant (specify type), Shower chair without back, Eyeglasses (rollator) ADL Screening (condition at time of admission) Patient's cognitive ability adequate to safely complete daily activities?: Yes Is the  patient deaf or have difficulty hearing?: No Does the patient have difficulty seeing, even when wearing glasses/contacts?: Yes Does the patient have difficulty concentrating, remembering, or making decisions?: No Patient able to express need for assistance with ADLs?: Yes Does the patient have difficulty dressing or bathing?: No Independently performs ADLs?: Yes (appropriate for developmental age) Does the patient have difficulty walking or climbing stairs?: Yes Weakness of Legs: Both Weakness of Arms/Hands: None  Permission Sought/Granted                  Emotional Assessment       Orientation: :  (Forgetful) Alcohol / Substance Use: Not Applicable Psych Involvement: No (comment)  Admission diagnosis:  Syncope [R55] Near syncope [R55] Patient Active Problem List   Diagnosis Date Noted   Syncope 02/09/2023   Elevated troponin 02/09/2023   Pneumonia 02/09/2023   Polycythemia 02/09/2023   Renal insufficiency 02/09/2023   Hypothyroidism 02/09/2023   Chronic pain 02/09/2023   Memory loss 02/09/2023   History of meningioma of the brain 02/09/2023   Brain tumor 12/12/2018   Meningioma 11/14/2018   Acute respiratory failure with hypoxemia    Acute encephalopathy    Cerebral edema 07/08/2018   Pressure injury of skin 07/08/2018   Brain mass    Endotracheally intubated    Chronic congestive heart failure    Encounter for orogastric (OG)  tube placement    Seizure    S/P shoulder replacement, 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   Excessive daytime sleepiness 11/17/2016   Snoring 11/17/2016   Takotsubo syndrome- documented by cath/ echo at [facility] April 2013 05/29/2013   Situational stress 05/29/2013   Morbid obesity with BMI of 50.0-59.9, adult 05/29/2013   OSA (obstructive sleep apnea) 05/29/2013   HTN (hypertension) 05/29/2013   Dyslipidemia 05/29/2013   PCP:  Ailene Ravel, MD Pharmacy:   [facility] [address], [place] - [address] [facility] [place] [address] Phone: [phone] Fax: [phone]     Social Determinants of Health (SDOH) Social History: SDOH Screenings   Food Insecurity: No Food Insecurity (02/09/2023)  Housing: Low Risk  (02/09/2023)  Transportation Needs: No Transportation Needs (02/09/2023)  Utilities: Not At Risk (02/09/2023)  Tobacco Use: Medium Risk (02/09/2023)   SDOH Interventions:     Readmission Risk Interventions     No data to display

## 2023-02-10 NOTE — Progress Notes (Signed)
Pt is not alert and oriented. 76-  Called daughter, Alexis Edwards and asked her to call back to get her screened for MRI. Pt has not called back [phone]

## 2023-02-10 NOTE — Progress Notes (Signed)
PROGRESS NOTE    Alexis Edwards  WUJ:[id number] DOB: 21-Sep-1946 DOA: 02/08/2023 PCP: Ailene Ravel, MD    Brief Narrative:   Alexis Edwards is a 77 y.o. female with medical history significant of hypertension, hyperlipidemia, CHF, Takotsubo syndrome, COPD, chronic pain morbid obesity, OSA not on CPAP who presents after having a syncopal episode while playing bingo.  History is obtained from the patient with assistance of her daughter who is present at bedside.  Patient still lives alone, but daughter makes note that over the last 4 months her short-term memory has significantly worsened.  Patient reports that she thinks she had eaten some nachos at some point while she was playing bingo yesterday.  She recalls getting a global headache and feeling lightheaded.  Patient reports that she did not lose consciousness, but EMS reported that bystanders stated that she lost consciousness briefly for which they called EMS.    Assessment and Plan: Syncope Acute.  Patient presents after having syncopal episode while playing bingo.  Report feeling having a global headache and feeling lightheaded prior to passing out.  No seizure-like activity was reported.  CT angiogram of the head and neck did not note any large vessel occlusion or mass. -Admit to telemetry bed -orthostatic vital signs negative - D-dimer not significantly elevated for age and TSH mildly elevated -Check echocardiogram -PT to evaluate and treat -Will need to discuss safety precautions and [facility] law for which patient should not to drive for at least 6 months as cause of symptoms is not totally clear   Elevated troponin Acute.  High-sensitivity troponin essentially flat at 35.  Patient denied any complaints of chest pain.  EKG without significant ischemic changes. -Follow-up echocardiogram   Possible pneumonia Doubt pna   Polycythemia Hemoglobin 16.5 on admission, but previously noted to be elevated as well.   Possibly secondary do to patient being dehydrated or untreated sleep apnea. -hgb trending down   Renal insufficiency On admission creatinine elevated up to 1.25.  Baseline creatinine previously in 2020 was 0.88.  Possibly related to patient being on furosemide and not eating and drinking like normal due to issues with memory. -trend Cr   Hypothyroidism -resume home meds   Chronic pain Patient has pain in her back and legs which she is on oxycodone for pain. -Continue Cymbalta, gabapentin, and oxycodone as needed for pain   Memory loss Patient's for short-term memory, but daughter notes still currently living in her husband had alone after he passed away 2 years ago.  -outpatient follow up with PCP for depression   History of meningioma Patient with a prior history of meningioma which was resected.  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   -doubt seizure   OSA Patient reportedly not on CPAP at night   Obesity Estimated body mass index is 55.48 kg/m as calculated from the following:   Height as of this encounter:  (1.499 m).   Weight as of this encounter: 124.6 kg.   DVT prophylaxis: enoxaparin (LOVENOX) injection 40 mg Start: 02/09/23 1000    Code Status: Full Code Family Communication: granddaughter at bedside  Disposition Plan:  Level of care: Telemetry Medical Status is: Observation The patient will require care spanning > 2 midnights and should be moved to inpatient because: further work up in process    Consultants:  none   Subjective: Still with feelings of dizziness at times  Objective: Vitals:   02/09/23 1941 02/10/23  0416 02/10/23 0417 02/10/23 0835  BP: 134/62 (!) 156/85  (!) 159/61  Pulse: 72 67  65  Resp: Temp: (!) 97.5 F (36.4 C) 98.3 F (36.8 C)  97.7 F (36.5 C)  TempSrc: Oral Oral  Oral  SpO2: 92% 93%  93%  Weight:   124.6 kg   Height:        Intake/Output Summary (Last  24 hours) at 02/10/2023 1213 Last data filed at 02/09/2023 1700 Gross per 24 hour  Intake 240 ml  Output --  Net 240 ml   Filed Weights   02/09/23 1710 02/10/23 0417  Weight: 125.2 kg 124.6 kg    Examination:   General: Appearance:    Severely obese female in no acute distress     Lungs:     respirations unlabored  Heart:    Normal heart rate.     MS:   All extremities are intact.    Neurologic:   Awake, alert       Data Reviewed: I have personally reviewed following labs and imaging studies  CBC: Recent Labs  Lab 02/08/23 2240 02/10/23 0402  WBC 9.8 8.0  NEUTROABS 6.5  --   HGB 16.5* 15.1*  HCT 49.2* 44.0  MCV 100.2* 99.3  PLT 229 211   Basic Metabolic Panel: Recent Labs  Lab 02/08/23 2240 02/10/23 0402  NA 142 138  K 4.0 5.1  CL 103 105  CO2 27 27  GLUCOSE 174* 110*  BUN 13 12  CREATININE 1.25* 1.37*  CALCIUM 9.3 8.4*  MG 1.9  --    GFR: Estimated Creatinine Clearance: 41.2 mL/min (A) (by C-G formula based on SCr of 1.37 mg/dL (H)). Liver Function Tests: Recent Labs  Lab 02/08/23 2240  AST 18  ALT 11  ALKPHOS 78  BILITOT 0.9  PROT 6.7  ALBUMIN 3.6   No results for input(s): "LIPASE", "AMYLASE" in the last 168 hours. No results for input(s): "AMMONIA" in the last 168 hours. Coagulation Profile: No results for input(s): "INR", "PROTIME" in the last 168 hours. Cardiac Enzymes: No results for input(s): "CKTOTAL", "CKMB", "CKMBINDEX", "TROPONINI" in the last 168 hours. BNP (last 3 results) No results for input(s): "PROBNP" in the last 8760 hours. HbA1C: No results for input(s): "HGBA1C" in the last 72 hours. CBG: Recent Labs  Lab 02/09/23 1947  GLUCAP 160*   Lipid Profile: No results for input(s): "CHOL", "HDL", "LDLCALC", "TRIG", "CHOLHDL", "LDLDIRECT" in the last 72 hours. Thyroid Function Tests: Recent Labs    02/09/23 0903  TSH 8.543*  FREET4 0.99   Anemia Panel: No results for input(s): "VITAMINB12", "FOLATE", "FERRITIN",  "TIBC", "IRON", "RETICCTPCT" in the last 72 hours. Sepsis Labs: Recent Labs  Lab 02/09/23 0115  PROCALCITON <0.10    No results found for this or any previous visit (from the past 240 hour(s)).       Radiology Studies: CT Angio Head W or Wo Contrast  Result Date: 02/09/2023 CLINICAL DATA:  Syncope EXAM: CT ANGIOGRAPHY HEAD TECHNIQUE: Multidetector CT imaging of the head was performed using the standard protocol during bolus administration of intravenous contrast. Multiplanar CT image reconstructions and MIPs were obtained to evaluate the vascular anatomy. RADIATION DOSE REDUCTION: This exam was performed according to the departmental dose-optimization program which includes automated exposure control, adjustment of the mA and/or kV according to patient size and/or use of iterative reconstruction technique. CONTRAST:  75mL OMNIPAQUE IOHEXOL 350 MG/ML SOLN COMPARISON:  07/07/2018 FINDINGS: CT HEAD Brain: There is  no mass, hemorrhage or extra-axial collection. Unchanged left frontal encephalomalacia. There is periventricular hypoattenuation compatible with chronic microvascular disease. Vascular: No abnormal hyperdensity of the major intracranial arteries or dural venous sinuses. No intracranial atherosclerosis. Skull: Remote left frontal craniotomy. Sinuses/Orbits: No fluid levels or advanced mucosal thickening of the visualized paranasal sinuses. No mastoid or middle ear effusion. The orbits are normal. CTA HEAD POSTERIOR CIRCULATION: --Vertebral arteries: Normal --Inferior cerebellar arteries: Normal. --Basilar artery: Normal. --Superior cerebellar arteries: Normal. --Posterior cerebral arteries: Normal. The right PCA is predominantly supplied by the posterior communicating artery. ANTERIOR CIRCULATION: --Intracranial internal carotid arteries: Normal. --Anterior cerebral arteries (ACA): Normal. --Middle cerebral arteries (MCA): Normal. Review of the MIP images confirms the above findings.  IMPRESSION: 1. No emergent large vessel occlusion or high-grade stenosis of the intracranial arteries. 2. No intracranial aneurysm. 3. Unchanged left frontal encephalomalacia. Electronically Signed   By: Deatra Robinson M.D.   On: 02/09/2023 02:31   DG Chest 2 View  Result Date: 02/09/2023 CLINICAL DATA:  Right lung crackles EXAM: CHEST - 2 VIEW COMPARISON:  07/09/2018 FINDINGS: Mild patchy right lower lobe opacity, overlying the spine on the lateral view, suspicious for pneumonia. Left lung is clear. No pleural effusion or pneumothorax. The heart is top-normal in size. IMPRESSION: Mild patchy right lower lobe opacity, suspicious for pneumonia. Electronically Signed   By: Charline Bills M.D.   On: 02/09/2023 00:27        Scheduled Meds:  DULoxetine  60 mg Oral Daily   enoxaparin (LOVENOX) injection  40 mg Subcutaneous Q24H   famotidine  10 mg Oral BID   fesoterodine  4 mg Oral Daily   gabapentin  400 mg Oral TID   levothyroxine  50 mcg Oral QAC breakfast   rosuvastatin  20 mg Oral Daily   sodium chloride flush  3 mL Intravenous Q12H   Continuous Infusions:  sodium chloride 75 mL/hr at 02/10/23 0846     LOS: 0 days    Time spent: 45 minutes spent on chart review, discussion with nursing staff, consultants, updating family and interview/physical exam; more than 50% of that time was spent in counseling and/or coordination of care.    Joseph Art, DO [facility] Available via Epic secure chat 7am-7pm After these hours, please refer to coverage provider listed on [email or web address] 02/10/2023, 12:13 PM

## 2023-02-10 NOTE — Progress Notes (Signed)
Per request of pt. And family. Chaplain provided ed for Advanced directive and left form with Pt.  Will have Chaplain paged if needed.  Venida Jarvis, [place], [facility], Pager [phone]

## 2023-02-10 NOTE — Evaluation (Signed)
Occupational Therapy Evaluation Patient Details Name: Alexis Edwards MRN: [id number] DOB: 1946-08-29 Today's Date: 02/10/2023   History of Present Illness 77 yo female with onset of syncopal episode during bingo game was admitted 4/17, referred to rehab to manage mobility.  Pt reports light headed feelings, HA and did forget syncope had happened when she passed out. Pt has elevated troponin, declining memory, polycythemia, but no clear source for issues other than dehydration.   PMHx:  OSA, Takotsubo syndrome, COPD, chronic pain, obesity, HTN, HLD, CHF,   Clinical Impression    Pt presents with decline in function and safety with ADLs and ADL mobility with impaired strength, balance and endurance. PTA pt lived at home alone and was Ind with ADLs/selfcare, IADLs, home mgt, cooking and drives. Pt reports that he has a cane and RW but mostly uses RW. Pt currently requires min A with LB ADLs and min - min guard A with mobility 1 person HHA. Pt would benefit from acute OT services to address impairments to maximize level of function and safety  vitals as follow:  supine 141/70 (90) pulse 73; sitting 153/83 (106) pulse 84 and sat 95%; standing 147/84 (101) pulse 86, sat 96%; post walk 159/79 (104) sat 96% and pulse 79.  Pt is light headed walking on trip from sink  back to bed, SOB and not dropping sats, elevating HR or demonstrating BP drop.      Recommendations for follow up therapy are one component of a multi-disciplinary discharge planning process, led by the attending physician.  Recommendations may be updated based on patient status, additional functional criteria and insurance authorization.   Assistance Recommended at Discharge Intermittent Supervision/Assistance  Patient can return home with the following A little help with bathing/dressing/bathroom;A little help with walking and/or transfers;Assistance with cooking/housework;Assist for transportation;Help with stairs or ramp for  entrance    Functional Status Assessment  Patient has had a recent decline in their functional status and demonstrates the ability to make significant improvements in function in a reasonable and predictable amount of time.  Equipment Recommendations  Other (comment) (sock sid)    Recommendations for Other Services       Precautions / Restrictions Precautions Precautions: Fall Precaution Comments: monitor O2 sats and HR Restrictions Weight Bearing Restrictions: No      Mobility Bed Mobility Overal bed mobility: Needs Assistance Bed Mobility: Supine to Sit, Sit to Supine     Supine to sit: Min guard Sit to supine: Min assist   General bed mobility comments: min assist to return to bed only for RLE briefly    Transfers Overall transfer level: Needs assistance Equipment used: Rolling walker (2 wheels), 1 person hand held assist Transfers: Sit to/from Stand Sit to Stand: Min assist                  Balance                                           ADL either performed or assessed with clinical judgement   ADL Overall ADL's : Needs assistance/impaired Eating/Feeding: Independent   Grooming: Wash/dry hands;Wash/dry face;Min guard;Standing   Upper Body Bathing: Set up   Lower Body Bathing: Minimal assistance Lower Body Bathing Details (indicate cue type and reason): uses LH bath sponge at home Upper Body Dressing : Set up   Lower Body Dressing: Minimal assistance  Toilet Transfer: Min guard;Ambulation;Regular Toilet;Grab bars   Toileting- Clothing Manipulation and Hygiene: Min guard;Sit to/from stand   Tub/ Shower Transfer: Minimal assistance;Min guard   Functional mobility during ADLs: Minimal assistance;Min guard General ADL Comments: supine 141/70 (90) pulse 73; sitting 153/83 (106) pulse 84 and sat 95%; standing 147/84 (101) pulse 86, sat 96%; post walk 159/79 (104) sat 96% and pulse 79     Vision Baseline Vision/History:   (reading glasses) Ability to See in Adequate Light: 0 Adequate Patient Visual Report: No change from baseline       Perception     Praxis      Pertinent Vitals/Pain Pain Assessment Pain Assessment: No/denies pain     Hand Dominance Right   Extremity/Trunk Assessment Upper Extremity Assessment Upper Extremity Assessment: Defer to OT evaluation;Generalized weakness   Lower Extremity Assessment Lower Extremity Assessment: Generalized weakness   Cervical / Trunk Assessment Cervical / Trunk Assessment: Kyphotic (mild)   Communication Communication Communication: No difficulties   Cognition Arousal/Alertness: Awake/alert Behavior During Therapy: WFL for tasks assessed/performed Overall Cognitive Status: Within Functional Limits for tasks assessed                                       General Comments  Pt was seen for progression of mobility to walk to BR and then for return to bed.  Pt is light headed on second half of walk with no drops in BP, sats or fluctuations of HR    Exercises     Shoulder Instructions      Home Living Family/patient expects to be discharged to:: Private residence Living Arrangements: Alone Available Help at Discharge: Friend(s);Available 24 hours/day Type of Home: House Home Access: Stairs to enter [employer] of Steps: 4 Entrance Stairs-Rails: Right;Left Home Layout: One level     Bathroom Shower/Tub: Producer, television/film/video: Handicapped height     Home Equipment: Cane - single point;Rollator (4 wheels);Grab bars - toilet;Adaptive equipment;Grab bars - tub/shower Adaptive Equipment: Reacher Additional Comments: assisted to don socks but pt does not wear them      Prior Functioning/Environment Prior Level of Function : Independent/Modified Independent             Mobility Comments: uses RW ADLs Comments: Ind with ADLs/selfcare, IADLs, home mgt, was cooking and drives        OT Problem  List: Decreased activity tolerance;Impaired balance (sitting and/or standing)      OT Treatment/Interventions: Self-care/ADL training;Energy conservation;DME and/or AE instruction;Therapeutic activities;Patient/family education;Therapeutic exercise    OT Goals(Current goals can be found in the care plan section) Acute Rehab OT Goals Patient Stated Goal: go home OT Goal Formulation: With patient/family Time For Goal Achievement: 02/24/23 Potential to Achieve Goals: Good ADL Goals Pt Will Perform Grooming: with supervision;with set-up;with modified independence;standing Pt Will Perform Upper Body Bathing: with modified independence Pt Will Perform Lower Body Bathing: with supervision;with modified independence;with adaptive equipment Pt Will Perform Upper Body Dressing: with modified independence Pt Will Perform Lower Body Dressing: with supervision;with modified independence;with adaptive equipment Pt Will Transfer to Toilet: with supervision;with modified independence;ambulating Pt Will Perform Toileting - Clothing Manipulation and hygiene: with supervision;with modified independence;sit to/from stand Pt Will Perform Tub/Shower Transfer: with min guard assist;with supervision;ambulating;shower seat;grab bars Additional ADL Goal #1: Pt will verbalize and demo 3 energy conservation techniques during ADLs and ADL mobility tasks  OT Frequency: Min 2X/week  Co-evaluation PT/OT/SLP Co-Evaluation/Treatment: Yes Reason for Co-Treatment: For patient/therapist safety;To address functional/ADL transfers PT goals addressed during session: Mobility/safety with mobility;Balance;Proper use of DME OT goals addressed during session: ADL's and self-care;Proper use of Adaptive equipment and DME      AM-PAC OT "6 Clicks" Daily Activity     Outcome Measure Help from another person eating meals?: None Help from another person taking care of personal grooming?: A Little Help from another person  toileting, which includes using toliet, bedpan, or urinal?: A Little Help from another person bathing (including washing, rinsing, drying)?: A Little Help from another person to put on and taking off regular upper body clothing?: None Help from another person to put on and taking off regular lower body clothing?: A Little 6 Click Score: 20   End of Session    Activity Tolerance: Patient tolerated treatment well Patient left: in bed;with call bell/phone within reach  OT Visit Diagnosis: Unsteadiness on feet (R26.81)                Time: 4098-1191 OT Time Calculation (min): 24 min Charges:  OT General Charges $OT Visit: 1 Visit OT Evaluation $OT Eval Low Complexity: 1 Low    Galen Manila 02/10/2023, 2:21 PM

## 2023-02-10 NOTE — Plan of Care (Addendum)
0700:Pt resting in bed at beginning of shift, pt alert and oriented x4, able to answer questions appropriately but forgetful, pt on telemetry, pt on room air, bed in lowest position.  Pt had echo performed during shift, pt seen by PT and OT during shift.   Pt given HCPOA paperwork by chaplain.  Pt daughter updated by RN, Pt pending echo results from MD  Around, pt having a dizzy episode, pt had recently gotten up to bathroom and when RN went into room pt was sitting on edge of bed and saying she felt very dizzy. RN assisted pt back into laying dow n on bed, RN took pt vitals Pt BP 152/75 was and HR 69 was, pt also complaining of slight nausea and lower abdominal bladder area pain 3/10 that started during dizzy episode. Pt daughter made RN aware pt has some hx of possible bladder/uretal stent in past and a hx of abdominal hernia Pt stating this dizzy episode felt like the initial one that brought into the hospital. RN notified Dr.Jessica Benjamine Mola of findings. MRI ordered. Pt claustrophobic, MD ordered X1 Valium for MRI. Pt stating that after few minutes of laying, dizziness and nausea resolving. Pt declining anything for pain. Pt pending MRI

## 2023-02-11 DIAGNOSIS — R42 Dizziness and giddiness: Secondary | ICD-10-CM

## 2023-02-11 DIAGNOSIS — R55 Syncope and collapse: Secondary | ICD-10-CM | POA: Diagnosis not present

## 2023-02-11 LAB — BASIC METABOLIC PANEL
Anion gap: 12 (ref 5–15)
BUN: 20 mg/dL (ref 8–23)
CO2: 23 mmol/L (ref 22–32)
Calcium: 8.8 mg/dL — ABNORMAL LOW (ref 8.9–10.3)
Chloride: 104 mmol/L (ref 98–111)
Creatinine, Ser: 1.26 mg/dL — ABNORMAL HIGH (ref 0.44–1.00)
GFR, Estimated: 44 mL/min — ABNORMAL LOW (ref >60)
Glucose, Bld: 95 mg/dL (ref 70–99)
Potassium: 4.2 mmol/L (ref 3.5–5.1)
Sodium: 139 mmol/L (ref 135–145)

## 2023-02-11 MED ORDER — MECLIZINE HCL 12.5 MG PO TABS
12.5000 mg | ORAL_TABLET | Freq: Three times a day (TID) | ORAL | Status: DC | PRN
  Administered 2023-02-11: 12.5 mg via ORAL
  Filled 2023-02-11 (×2): qty 1

## 2023-02-11 MED ORDER — OXYCODONE HCL 5 MG PO TABS
5.0000 mg | ORAL_TABLET | Freq: Four times a day (QID) | ORAL | Status: DC | PRN
  Administered 2023-02-12: 5 mg via ORAL
  Filled 2023-02-11: qty 1

## 2023-02-11 MED ORDER — AMLODIPINE BESYLATE 5 MG PO TABS
5.0000 mg | ORAL_TABLET | Freq: Every day | ORAL | Status: DC
  Administered 2023-02-11 – 2023-02-13 (×3): 5 mg via ORAL
  Filled 2023-02-11 (×3): qty 1

## 2023-02-11 NOTE — TOC Progression Note (Signed)
Transition of Care [facility]) - Progression Note    Patient Details  Name: Alexis Edwards MRN: [id number] Date of Birth: 1946/04/14  Transition of [facility] CM/SW Contact  Helene Kelp, [place] Phone Number: 02/11/2023, 10:01 AM  Clinical Narrative:    CSW followed-up patient disposition regarding SNF Rehab referrals (per handoff). CSW followed-up with the patient and natural supports to update them on current disposition efforts.   CSW made contact with the following: Patient  expressed reference as [name].. Natural supports (daughter/[name]) expressed preference as [name].    Note: At the end of this shift, all referrals for the pt Is pending  CSW updated members of the clinical team to this writer efforts to support the patient's current disposition.   Disposition follow-up: Bed status check, and update pt, family and clinical team.   No other needs identified at this current time for this Clinical research associate. Weekend CSW to follow and continue efforts and progress made to support the patient's disposition.    Expected Discharge Plan: Home w Home Health Services Barriers to Discharge: Continued Medical Work up  Expected Discharge Plan and Services   Discharge Planning Services: CM Consult   Living arrangements for the past 2 months: Single Family Home                 DME Arranged: Dan Humphreys DME Agency: [employer] Date DME Agency Contacted: 02/10/23 Time DME Agency Contacted: 1113   HH Arranged: PT HH Agency: [facility] Date HH Agency Contacted: 02/10/23 Time HH Agency Contacted: 1113 Representative spoke with at [facility] Agency: Lorenza Chick   Social Determinants of Health (SDOH) Interventions SDOH Screenings   Food Insecurity: No Food Insecurity (02/09/2023)  Housing: Low Risk  (02/09/2023)  Transportation Needs: No Transportation Needs (02/09/2023)  Utilities: Not At Risk (02/09/2023)  Tobacco Use: Medium Risk  (02/09/2023)    Readmission Risk Interventions     No data to display

## 2023-02-11 NOTE — Progress Notes (Signed)
Mobility Specialist - Progress Note   02/11/23 1120  Orthostatic Sitting  BP- Sitting 159/84  Orthostatic Standing at 3 minutes  BP- Standing at 3 minutes 176/85  Mobility  Activity Ambulated with assistance in room  Level of Assistance Contact guard assist, steadying assist  Assistive Device Front wheel walker  Distance Ambulated (ft) 15 ft  Activity Response Tolerated well  Mobility Referral Yes  $Mobility charge 1 Mobility   Pt was received in chair and agreeable to mobility. Pt c/o feeling lightheaded throughout (see blood pressures above) Pt was left in bed with all needs met.   Alda Lea  Mobility Specialist Please contact via Special educational needs teacher or Rehab office at [phone]

## 2023-02-11 NOTE — Progress Notes (Signed)
PROGRESS NOTE    Alexis Edwards  ZOX:[id number] DOB: Oct 19, 1946 DOA: 02/08/2023 PCP: Ailene Ravel, MD    Brief Narrative:   Alexis Edwards is a 77 y.o. female with medical history significant of hypertension, hyperlipidemia, CHF, Takotsubo syndrome, COPD, chronic pain morbid obesity, OSA not on CPAP who presents after having a syncopal episode while playing bingo.  History is obtained from the patient with assistance of her daughter who is present at bedside.  Patient still lives alone, but daughter makes note that over the last 4 months her short-term memory has significantly worsened.  Patient reports that she thinks she had eaten some nachos at some point while she was playing bingo yesterday.  She recalls getting a global headache and feeling lightheaded.  Patient reports that she did not lose consciousness, but EMS reported that bystanders stated that she lost consciousness briefly for which they called EMS.    Assessment and Plan: Syncope/dizziness Acute.  Patient presents after having syncopal episode while playing bingo.  Report feeling having a global headache and feeling lightheaded prior to passing out.  No seizure-like activity was reported.  CT angiogram of the head and neck did not note any large vessel occlusion or mass. -orthostatic vital signs negative - D-dimer not significantly elevated for age and TSH mildly elevated -echocardiogram: normal EF, grade 1 diastolic -PT to evaluate and treat- SNF -Will need to discuss safety precautions and [facility] law for which patient should not to drive for at least 6 months as cause of symptoms is not totally clear  -MRI negative for CVA - trial of meclizine   Elevated troponin Acute.  High-sensitivity troponin essentially flat at 35.  Patient denied any complaints of chest pain.  EKG without significant ischemic changes.  Possible pneumonia Doubt pna   Polycythemia Hemoglobin 16.5 on admission, but previously  noted to be elevated as well.  Possibly secondary do to patient being dehydrated or untreated sleep apnea. -hgb trending down -would get outpatient sleep study   Renal insufficiency On admission creatinine elevated up to 1.25.  Baseline creatinine previously in 2020 was 0.88.  Possibly related to patient being on furosemide and not eating and drinking like normal due to issues with memory. -trend Cr   Hypothyroidism -resume home meds   Chronic pain Patient has pain in her back and legs which she is on oxycodone for pain. -Continue Cymbalta, gabapentin, and oxycodone as needed for pain   Memory loss Patient's for short-term memory, but daughter notes still currently living in her husband had alone after he passed away 2 years ago.  -outpatient follow up with PCP for depression   History of meningioma Patient with a prior history of meningioma which was resected.  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   -doubt seizure   OSA Patient reportedly not on CPAP at night   Obesity Estimated body mass index is 56.1 kg/m as calculated from the following:   Height as of this encounter:  (1.499 m).   Weight as of this encounter: 126 kg.   DVT prophylaxis: enoxaparin (LOVENOX) injection 40 mg Start: 02/09/23 1000    Code Status: Full Code Family Communication: called daughter  Disposition Plan:  Level of care: Telemetry Medical Status is: Observation The patient will require care spanning > 2 midnights and should be moved to inpatient because: further work up in process for dizziness    Consultants:  none   Subjective: Still with  feelings of dizziness at times  Objective: Vitals:   02/10/23 1956 02/11/23 0426 02/11/23 0500 02/11/23 0824  BP: 130/72 (!) 144/68  139/87  Pulse: 72 69  62  Resp: 17 18  18   Temp: 98.7 F (37.1 C) 98.6 F (37 C)  98 F (36.7 C)  TempSrc: Oral Oral  Oral  SpO2: 94% 95%  95%  Weight:   126  kg   Height:       No intake or output data in the 24 hours ending 02/11/23 1051  Filed Weights   02/09/23 1710 02/10/23 0417 02/11/23 0500  Weight: 125.2 kg 124.6 kg 126 kg    Examination:   General: Appearance:    Severely obese female in no acute distress     Lungs:     respirations unlabored  Heart:    Normal heart rate.     MS:   All extremities are intact.    Neurologic:   Awake, alert       Data Reviewed: I have personally reviewed following labs and imaging studies  CBC: Recent Labs  Lab 02/08/23 2240 02/10/23 0402  WBC 9.8 8.0  NEUTROABS 6.5  --   HGB 16.5* 15.1*  HCT 49.2* 44.0  MCV 100.2* 99.3  PLT 229 211   Basic Metabolic Panel: Recent Labs  Lab 02/08/23 2240 02/10/23 0402 02/11/23 0801  NA 142 138 139  K 4.0 5.1 4.2  CL 103 105 104  CO2 27 27 23   GLUCOSE 174* 110* 95  BUN 13 12 20   CREATININE 1.25* 1.37* 1.26*  CALCIUM 9.3 8.4* 8.8*  MG 1.9  --   --    GFR: Estimated Creatinine Clearance: 45 mL/min (A) (by C-G formula based on SCr of 1.26 mg/dL (H)). Liver Function Tests: Recent Labs  Lab 02/08/23 2240  AST 18  ALT 11  ALKPHOS 78  BILITOT 0.9  PROT 6.7  ALBUMIN 3.6   No results for input(s): "LIPASE", "AMYLASE" in the last 168 hours. No results for input(s): "AMMONIA" in the last 168 hours. Coagulation Profile: No results for input(s): "INR", "PROTIME" in the last 168 hours. Cardiac Enzymes: No results for input(s): "CKTOTAL", "CKMB", "CKMBINDEX", "TROPONINI" in the last 168 hours. BNP (last 3 results) No results for input(s): "PROBNP" in the last 8760 hours. HbA1C: No results for input(s): "HGBA1C" in the last 72 hours. CBG: Recent Labs  Lab 02/09/23 1947  GLUCAP 160*   Lipid Profile: No results for input(s): "CHOL", "HDL", "LDLCALC", "TRIG", "CHOLHDL", "LDLDIRECT" in the last 72 hours. Thyroid Function Tests: Recent Labs    02/09/23 0903  TSH 8.543*  FREET4 0.99   Anemia Panel: No results for input(s):  "VITAMINB12", "FOLATE", "FERRITIN", "TIBC", "IRON", "RETICCTPCT" in the last 72 hours. Sepsis Labs: Recent Labs  Lab 02/09/23 0115  PROCALCITON <0.10    No results found for this or any previous visit (from the past 240 hour(s)).       Radiology Studies: MR BRAIN WO CONTRAST  Result Date: 02/10/2023 CLINICAL DATA:  Syncope EXAM: MRI HEAD WITHOUT CONTRAST TECHNIQUE: Multiplanar, multiecho pulse sequences of the brain and surrounding structures were obtained without intravenous contrast. COMPARISON:  02/01/2021 FINDINGS: Brain: No acute infarct, mass effect or extra-axial collection. No acute or chronic hemorrhage. There is multifocal hyperintense T2-weighted signal within the white matter. Generalized volume loss. Left frontal white matter changes at site of resected meningioma. Left frontal pole encephalomalacia. The midline structures are normal. Vascular: Major flow voids are preserved. Skull and upper cervical  spine: Normal calvarium and skull base. Visualized upper cervical spine and soft tissues are normal. Sinuses/Orbits:No paranasal sinus fluid levels or advanced mucosal thickening. No mastoid or middle ear effusion. Normal orbits. IMPRESSION: 1. No acute intracranial abnormality. 2. Left frontal white matter changes at site of resected meningioma. 3. Left frontal pole encephalomalacia. Electronically Signed   By: Deatra Robinson M.D.   On: 02/10/2023 23:46   ECHOCARDIOGRAM COMPLETE  Result Date: 02/10/2023    ECHOCARDIOGRAM REPORT   Patient Name:   Alexis Edwards Date of Exam: 02/10/2023 Medical Rec #:  [id number]          Height:       59.0 in Accession #:    [id number]         Weight:       274.7 lb Date of Birth:  Oct 06, 1946         BSA:          2.111 m Patient Age:    76 years           BP:           159/61 mmHg Patient Gender: F                  HR:           73 bpm. Exam Location:  Inpatient Procedure: 2D Echo, Cardiac Doppler and Color Doppler Indications:    syncope  History:         Patient has prior history of Echocardiogram examinations. CHF,                 COPD; Risk Factors:Hypertension and Dyslipidemia.  Sonographer:    Mike Gip Referring Phys: [phone] RONDELL A SMITH IMPRESSIONS  1. Left ventricular ejection fraction, by estimation, is 70 to 75%. The left ventricle has hyperdynamic function. The left ventricle has no regional wall motion abnormalities. There is mild concentric left ventricular hypertrophy. Left ventricular diastolic parameters are consistent with Grade I diastolic dysfunction (impaired relaxation).  2. Right ventricular systolic function is normal. The right ventricular size is normal.  3. Left atrial size was mildly dilated.  4. The mitral valve is normal in structure. No evidence of mitral valve regurgitation. No evidence of mitral stenosis.  5. The aortic valve was not well visualized. There is moderate calcification of the aortic valve. Aortic valve regurgitation is not visualized. Mild aortic valve stenosis. Aortic valve mean gradient measures 14.5 mmHg. Aortic valve Vmax measures 2.35 m/s.  6. The inferior vena cava is normal in size with greater than 50% respiratory variability, suggesting right atrial pressure of 3 mmHg. FINDINGS  Left Ventricle: Left ventricular ejection fraction, by estimation, is 70 to 75%. The left ventricle has hyperdynamic function. The left ventricle has no regional wall motion abnormalities. The left ventricular internal cavity size was normal in size. There is mild concentric left ventricular hypertrophy. Left ventricular diastolic parameters are consistent with Grade I diastolic dysfunction (impaired relaxation). Right Ventricle: The right ventricular size is normal. No increase in right ventricular wall thickness. Right ventricular systolic function is normal. Left Atrium: Left atrial size was mildly dilated. Right Atrium: Right atrial size was normal in size. Pericardium: There is no evidence of pericardial effusion. Mitral  Valve: The mitral valve is normal in structure. No evidence of mitral valve regurgitation. No evidence of mitral valve stenosis. Tricuspid Valve: The tricuspid valve is normal in structure. Tricuspid valve regurgitation is trivial. No evidence of tricuspid stenosis. Aortic Valve: The aortic  valve was not well visualized. There is moderate calcification of the aortic valve. Aortic valve regurgitation is not visualized. Mild aortic stenosis is present. Aortic valve mean gradient measures 14.5 mmHg. Aortic valve peak gradient measures 22.1 mmHg. Pulmonic Valve: The pulmonic valve was normal in structure. Pulmonic valve regurgitation is not visualized. No evidence of pulmonic stenosis. Aorta: The aortic root is normal in size and structure. Venous: The inferior vena cava is normal in size with greater than 50% respiratory variability, suggesting right atrial pressure of 3 mmHg. IAS/Shunts: No atrial level shunt detected by color flow Doppler.  LEFT VENTRICLE PLAX 2D LVIDd:         3.80 cm      Diastology LVIDs:         2.20 cm      LV e' medial:    6.42 cm/s LV PW:         1.40 cm      LV E/e' medial:  14.4 LV IVS:        1.30 cm      LV e' lateral:   8.59 cm/s LVOT diam:     2.00 cm      LV E/e' lateral: 10.8 LVOT Area:     3.14 cm  LV Volumes (MOD) LV vol d, MOD A2C: 119.0 ml LV vol d, MOD A4C: 119.0 ml LV vol s, MOD A2C: 27.7 ml LV vol s, MOD A4C: 37.5 ml LV SV MOD A2C:     91.3 ml LV SV MOD A4C:     119.0 ml LV SV MOD BP:      87.2 ml RIGHT VENTRICLE RV Basal diam:  4.20 cm RV S prime:     15.80 cm/s TAPSE (M-mode): 2.1 cm LEFT ATRIUM             Index        RIGHT ATRIUM           Index LA diam:        3.30 cm 1.56 cm/m   RA Area:     15.50 cm LA Vol (A2C):   51.9 ml 24.59 ml/m  RA Volume:   38.70 ml  18.34 ml/m LA Vol (A4C):   54.2 ml 25.68 ml/m LA Biplane Vol: 56.2 ml 26.63 ml/m  AORTIC VALVE AV Vmax:      235.00 cm/s AV Vmean:     178.500 cm/s AV VTI:       0.576 m AV Peak Grad: 22.1 mmHg AV Mean Grad:  14.5 mmHg  AORTA Ao Root diam: 3.00 cm MITRAL VALVE MV Area (PHT): 3.28 cm     SHUNTS MV Decel Time: 231 msec     Systemic Diam: 2.00 cm MV E velocity: 92.50 cm/s MV A velocity: 116.00 cm/s MV E/A ratio:  0.80 Arvilla Meres MD Electronically signed by Arvilla Meres MD Signature Date/Time: 02/10/2023/1:47:39 PM    Final         Scheduled Meds:  amLODipine  5 mg Oral Daily   DULoxetine  60 mg Oral Daily   enoxaparin (LOVENOX) injection  40 mg Subcutaneous Q24H   famotidine  10 mg Oral BID   gabapentin  400 mg Oral TID   levothyroxine  50 mcg Oral QAC breakfast   rosuvastatin  20 mg Oral Daily   sodium chloride flush  3 mL Intravenous Q12H   Continuous Infusions:     LOS: 0 days    Time spent: 45 minutes spent on chart review, discussion with  nursing staff, consultants, updating family and interview/physical exam; more than 50% of that time was spent in counseling and/or coordination of care.    Joseph Art, DO [facility] Available via Epic secure chat 7am-7pm After these hours, please refer to coverage provider listed on [email or web address] 02/11/2023, 10:51 AM

## 2023-02-12 DIAGNOSIS — R42 Dizziness and giddiness: Secondary | ICD-10-CM | POA: Diagnosis not present

## 2023-02-12 DIAGNOSIS — R55 Syncope and collapse: Secondary | ICD-10-CM | POA: Diagnosis not present

## 2023-02-12 MED ORDER — MECLIZINE HCL 12.5 MG PO TABS
12.5000 mg | ORAL_TABLET | Freq: Three times a day (TID) | ORAL | Status: DC
  Administered 2023-02-12 (×3): 12.5 mg via ORAL
  Filled 2023-02-12 (×4): qty 1

## 2023-02-12 NOTE — Progress Notes (Signed)
PROGRESS NOTE    Alexis Edwards  ZOX:[id number] DOB: [birth year] DOA: 02/08/2023 PCP: Ailene Ravel, MD    Brief Narrative:   Alexis Edwards is a 77 y.o. female with medical history significant of hypertension, hyperlipidemia, CHF, Takotsubo syndrome, COPD, chronic pain morbid obesity, OSA not on CPAP who presents after having a syncopal episode while playing bingo.  History is obtained from the patient with assistance of her daughter who is present at bedside.  Patient still lives alone, but daughter makes note that over the last 4 months her short-term memory has significantly worsened.  Patient reports that she thinks she had eaten some nachos at some point while she was playing bingo yesterday.  She recalls getting a global headache and feeling lightheaded.  Patient reports that she did not lose consciousness, but EMS reported that bystanders stated that she lost consciousness briefly for which they called EMS.   Symptoms improved with meclizine.     Assessment and Plan: Syncope/dizziness Acute.  Patient presents after having syncopal episode while playing bingo.  Report feeling having a global headache and feeling lightheaded prior to passing out.  No seizure-like activity was reported.  CT angiogram of the head and neck did not note any large vessel occlusion or mass. -orthostatic vital signs negative - D-dimer not significantly elevated for age and TSH mildly elevated -echocardiogram: normal EF, grade 1 diastolic -PT to evaluate and treat- SNF -Will need to discuss safety precautions and [facility] law for which patient should not to drive for at least 6 months as cause of symptoms is not totally clear  -MRI negative for CVA - trial of meclizine resolved dizziness-- schedule for now  Elevated troponin Acute.  High-sensitivity troponin essentially flat at 35.  Patient denied any complaints of chest pain.  EKG without significant ischemic changes.  Possible  pneumonia Doubt pna   Polycythemia Hemoglobin 16.5 on admission, but previously noted to be elevated as well.  Possibly secondary do to patient being dehydrated or untreated sleep apnea. -hgb trending down -would get outpatient sleep study   Renal insufficiency On admission creatinine elevated up to 1.25.  Baseline creatinine previously in 2020 was 0.88.  Possibly related to patient being on furosemide and not eating and drinking like normal due to issues with memory. -trend Cr   Hypothyroidism -resume home meds   Chronic pain Patient has pain in her back and legs which she is on oxycodone for pain. -Continue Cymbalta, gabapentin, and oxycodone as needed for pain   Memory loss Patient's for short-term memory, but daughter notes still currently living in her husband had alone after he passed away 2 years ago.  -outpatient follow up with PCP for depression   History of meningioma Patient with a prior history of meningioma which was resected.  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   -doubt seizure   OSA Patient reportedly not on CPAP at night   Obesity Estimated body mass index is 56.19 kg/m as calculated from the following:   Height as of this encounter:  (1.499 m).   Weight as of this encounter: 126.2 kg.   DVT prophylaxis: enoxaparin (LOVENOX) injection 40 mg Start: 02/09/23 1000    Code Status: Full Code Family Communication: called daughter  Disposition Plan:  Level of care: Telemetry Medical Status is: inpt-- plan for SNF when bed vailable    Consultants:  none   Subjective: Meclizine resolved dizziness   Objective: Vitals:  02/11/23 2021 02/12/23 0500 02/12/23 0622 02/12/23 0813  BP: (!) 143/75  (!) 141/64 (!) 146/81  Pulse: 71  63 62  Resp: 16  16 20   Temp: 98.1 F (36.7 C)  98.4 F (36.9 C) 98 F (36.7 C)  TempSrc: Oral  Oral Oral  SpO2: 95%   94%  Weight:  126.2 kg    Height:         Intake/Output Summary (Last 24 hours) at 02/12/2023 1100 Last data filed at 02/12/2023 0419 Gross per 24 hour  Intake 540 ml  Output --  Net 540 ml    Filed Weights   02/10/23 0417 02/11/23 0500 02/12/23 0500  Weight: 124.6 kg 126 kg 126.2 kg    Examination:   General: Appearance:    Severely obese female in no acute distress     Lungs:     respirations unlabored  Heart:    Normal heart rate.     MS:   All extremities are intact.    Neurologic:   Awake, alert       Data Reviewed: I have personally reviewed following labs and imaging studies  CBC: Recent Labs  Lab 02/08/23 2240 02/10/23 0402  WBC 9.8 8.0  NEUTROABS 6.5  --   HGB 16.5* 15.1*  HCT 49.2* 44.0  MCV 100.2* 99.3  PLT 229 211   Basic Metabolic Panel: Recent Labs  Lab 02/08/23 2240 02/10/23 0402 02/11/23 0801  NA 142 138 139  K 4.0 5.1 4.2  CL 103 105 104  CO2 27 27 23   GLUCOSE 174* 110* 95  BUN 13 12 20   CREATININE 1.25* 1.37* 1.26*  CALCIUM 9.3 8.4* 8.8*  MG 1.9  --   --    GFR: Estimated Creatinine Clearance: 45.1 mL/min (A) (by C-G formula based on SCr of 1.26 mg/dL (H)). Liver Function Tests: Recent Labs  Lab 02/08/23 2240  AST 18  ALT 11  ALKPHOS 78  BILITOT 0.9  PROT 6.7  ALBUMIN 3.6   No results for input(s): "LIPASE", "AMYLASE" in the last 168 hours. No results for input(s): "AMMONIA" in the last 168 hours. Coagulation Profile: No results for input(s): "INR", "PROTIME" in the last 168 hours. Cardiac Enzymes: No results for input(s): "CKTOTAL", "CKMB", "CKMBINDEX", "TROPONINI" in the last 168 hours. BNP (last 3 results) No results for input(s): "PROBNP" in the last 8760 hours. HbA1C: No results for input(s): "HGBA1C" in the last 72 hours. CBG: Recent Labs  Lab 02/09/23 1947  GLUCAP 160*   Lipid Profile: No results for input(s): "CHOL", "HDL", "LDLCALC", "TRIG", "CHOLHDL", "LDLDIRECT" in the last 72 hours. Thyroid Function Tests: No results for input(s):  "TSH", "T4TOTAL", "FREET4", "T3FREE", "THYROIDAB" in the last 72 hours.  Anemia Panel: No results for input(s): "VITAMINB12", "FOLATE", "FERRITIN", "TIBC", "IRON", "RETICCTPCT" in the last 72 hours. Sepsis Labs: Recent Labs  Lab 02/09/23 0115  PROCALCITON <0.10    No results found for this or any previous visit (from the past 240 hour(s)).       Radiology Studies: MR BRAIN WO CONTRAST  Result Date: 02/10/2023 CLINICAL DATA:  Syncope EXAM: MRI HEAD WITHOUT CONTRAST TECHNIQUE: Multiplanar, multiecho pulse sequences of the brain and surrounding structures were obtained without intravenous contrast. COMPARISON:  02/01/2021 FINDINGS: Brain: No acute infarct, mass effect or extra-axial collection. No acute or chronic hemorrhage. There is multifocal hyperintense T2-weighted signal within the white matter. Generalized volume loss. Left frontal white matter changes at site of resected meningioma. Left frontal pole encephalomalacia. The midline structures  are normal. Vascular: Major flow voids are preserved. Skull and upper cervical spine: Normal calvarium and skull base. Visualized upper cervical spine and soft tissues are normal. Sinuses/Orbits:No paranasal sinus fluid levels or advanced mucosal thickening. No mastoid or middle ear effusion. Normal orbits. IMPRESSION: 1. No acute intracranial abnormality. 2. Left frontal white matter changes at site of resected meningioma. 3. Left frontal pole encephalomalacia. Electronically Signed   By: Deatra Robinson M.D.   On: 02/10/2023 23:46   ECHOCARDIOGRAM COMPLETE  Result Date: 02/10/2023    ECHOCARDIOGRAM REPORT   Patient Name:   SANGITA ZANI Date of Exam: 02/10/2023 Medical Rec #:  [id number]          Height:       59.0 in Accession #:    [id number]         Weight:       274.7 lb Date of Birth:  Dec 24, 1945         BSA:          2.111 m Patient Age:    76 years           BP:           159/61 mmHg Patient Gender: F                  HR:           73 bpm.  Exam Location:  Inpatient Procedure: 2D Echo, Cardiac Doppler and Color Doppler Indications:    syncope  History:        Patient has prior history of Echocardiogram examinations. CHF,                 COPD; Risk Factors:Hypertension and Dyslipidemia.  Sonographer:    Mike Gip Referring Phys: [phone] RONDELL A SMITH IMPRESSIONS  1. Left ventricular ejection fraction, by estimation, is 70 to 75%. The left ventricle has hyperdynamic function. The left ventricle has no regional wall motion abnormalities. There is mild concentric left ventricular hypertrophy. Left ventricular diastolic parameters are consistent with Grade I diastolic dysfunction (impaired relaxation).  2. Right ventricular systolic function is normal. The right ventricular size is normal.  3. Left atrial size was mildly dilated.  4. The mitral valve is normal in structure. No evidence of mitral valve regurgitation. No evidence of mitral stenosis.  5. The aortic valve was not well visualized. There is moderate calcification of the aortic valve. Aortic valve regurgitation is not visualized. Mild aortic valve stenosis. Aortic valve mean gradient measures 14.5 mmHg. Aortic valve Vmax measures 2.35 m/s.  6. The inferior vena cava is normal in size with greater than 50% respiratory variability, suggesting right atrial pressure of 3 mmHg. FINDINGS  Left Ventricle: Left ventricular ejection fraction, by estimation, is 70 to 75%. The left ventricle has hyperdynamic function. The left ventricle has no regional wall motion abnormalities. The left ventricular internal cavity size was normal in size. There is mild concentric left ventricular hypertrophy. Left ventricular diastolic parameters are consistent with Grade I diastolic dysfunction (impaired relaxation). Right Ventricle: The right ventricular size is normal. No increase in right ventricular wall thickness. Right ventricular systolic function is normal. Left Atrium: Left atrial size was mildly dilated.  Right Atrium: Right atrial size was normal in size. Pericardium: There is no evidence of pericardial effusion. Mitral Valve: The mitral valve is normal in structure. No evidence of mitral valve regurgitation. No evidence of mitral valve stenosis. Tricuspid Valve: The tricuspid valve is normal in structure. Tricuspid valve  regurgitation is trivial. No evidence of tricuspid stenosis. Aortic Valve: The aortic valve was not well visualized. There is moderate calcification of the aortic valve. Aortic valve regurgitation is not visualized. Mild aortic stenosis is present. Aortic valve mean gradient measures 14.5 mmHg. Aortic valve peak gradient measures 22.1 mmHg. Pulmonic Valve: The pulmonic valve was normal in structure. Pulmonic valve regurgitation is not visualized. No evidence of pulmonic stenosis. Aorta: The aortic root is normal in size and structure. Venous: The inferior vena cava is normal in size with greater than 50% respiratory variability, suggesting right atrial pressure of 3 mmHg. IAS/Shunts: No atrial level shunt detected by color flow Doppler.  LEFT VENTRICLE PLAX 2D LVIDd:         3.80 cm      Diastology LVIDs:         2.20 cm      LV e' medial:    6.42 cm/s LV PW:         1.40 cm      LV E/e' medial:  14.4 LV IVS:        1.30 cm      LV e' lateral:   8.59 cm/s LVOT diam:     2.00 cm      LV E/e' lateral: 10.8 LVOT Area:     3.14 cm  LV Volumes (MOD) LV vol d, MOD A2C: 119.0 ml LV vol d, MOD A4C: 119.0 ml LV vol s, MOD A2C: 27.7 ml LV vol s, MOD A4C: 37.5 ml LV SV MOD A2C:     91.3 ml LV SV MOD A4C:     119.0 ml LV SV MOD BP:      87.2 ml RIGHT VENTRICLE RV Basal diam:  4.20 cm RV S prime:     15.80 cm/s TAPSE (M-mode): 2.1 cm LEFT ATRIUM             Index        RIGHT ATRIUM           Index LA diam:        3.30 cm 1.56 cm/m   RA Area:     15.50 cm LA Vol (A2C):   51.9 ml 24.59 ml/m  RA Volume:   38.70 ml  18.34 ml/m LA Vol (A4C):   54.2 ml 25.68 ml/m LA Biplane Vol: 56.2 ml 26.63 ml/m  AORTIC  VALVE AV Vmax:      235.00 cm/s AV Vmean:     178.500 cm/s AV VTI:       0.576 m AV Peak Grad: 22.1 mmHg AV Mean Grad: 14.5 mmHg  AORTA Ao Root diam: 3.00 cm MITRAL VALVE MV Area (PHT): 3.28 cm     SHUNTS MV Decel Time: 231 msec     Systemic Diam: 2.00 cm MV E velocity: 92.50 cm/s MV A velocity: 116.00 cm/s MV E/A ratio:  0.80 Arvilla Meres MD Electronically signed by Arvilla Meres MD Signature Date/Time: 02/10/2023/1:47:39 PM    Final         Scheduled Meds:  amLODipine  5 mg Oral Daily   DULoxetine  60 mg Oral Daily   enoxaparin (LOVENOX) injection  40 mg Subcutaneous Q24H   famotidine  10 mg Oral BID   gabapentin  400 mg Oral TID   levothyroxine  50 mcg Oral QAC breakfast   meclizine  12.5 mg Oral TID   rosuvastatin  20 mg Oral Daily   sodium chloride flush  3 mL Intravenous Q12H   Continuous Infusions:  LOS: 0 days    Time spent: 45 minutes spent on chart review, discussion with nursing staff, consultants, updating family and interview/physical exam; more than 50% of that time was spent in counseling and/or coordination of care.    Joseph Art, DO [facility] Available via Epic secure chat 7am-7pm After these hours, please refer to coverage provider listed on [email or web address] 02/12/2023, 11:00 AM

## 2023-02-13 DIAGNOSIS — R55 Syncope and collapse: Secondary | ICD-10-CM | POA: Diagnosis not present

## 2023-02-13 DIAGNOSIS — R42 Dizziness and giddiness: Secondary | ICD-10-CM | POA: Diagnosis not present

## 2023-02-13 LAB — CBC
HCT: 44.7 % (ref 36.0–46.0)
Hemoglobin: 14.7 g/dL (ref 12.0–15.0)
MCH: 33 pg (ref 26.0–34.0)
MCHC: 32.9 g/dL (ref 30.0–36.0)
MCV: 100.2 fL — ABNORMAL HIGH (ref 80.0–100.0)
Platelets: 212 10*3/uL (ref 150–400)
RBC: 4.46 MIL/uL (ref 3.87–5.11)
RDW: 12 % (ref 11.5–15.5)
WBC: 9.1 10*3/uL (ref 4.0–10.5)
nRBC: 0 % (ref 0.0–0.2)

## 2023-02-13 LAB — BASIC METABOLIC PANEL
Anion gap: 10 (ref 5–15)
BUN: 23 mg/dL (ref 8–23)
CO2: 26 mmol/L (ref 22–32)
Calcium: 9 mg/dL (ref 8.9–10.3)
Chloride: 102 mmol/L (ref 98–111)
Creatinine, Ser: 1.15 mg/dL — ABNORMAL HIGH (ref 0.44–1.00)
GFR, Estimated: 49 mL/min — ABNORMAL LOW (ref >60)
Glucose, Bld: 112 mg/dL — ABNORMAL HIGH (ref 70–99)
Potassium: 4.1 mmol/L (ref 3.5–5.1)
Sodium: 138 mmol/L (ref 135–145)

## 2023-02-13 MED ORDER — MECLIZINE HCL 12.5 MG PO TABS: 12.5000 mg | ORAL_TABLET | Freq: Three times a day (TID) | ORAL | 0 refills | Status: AC | PRN

## 2023-02-13 MED ORDER — FESOTERODINE FUMARATE ER 4 MG PO TB24
4.0000 mg | ORAL_TABLET | Freq: Every day | ORAL | Status: DC
  Administered 2023-02-13: 4 mg via ORAL
  Filled 2023-02-13: qty 1

## 2023-02-13 MED ORDER — OXYCODONE HCL 5 MG PO TABS: 5.0000 mg | ORAL_TABLET | Freq: Four times a day (QID) | ORAL | 0 refills | Status: DC | PRN

## 2023-02-13 MED ORDER — MECLIZINE HCL 12.5 MG PO TABS
12.5000 mg | ORAL_TABLET | Freq: Three times a day (TID) | ORAL | Status: DC | PRN
  Administered 2023-02-13: 12.5 mg via ORAL
  Filled 2023-02-13: qty 1

## 2023-02-13 MED ORDER — ENOXAPARIN SODIUM 60 MG/0.6ML IJ SOSY: 60.0000 mg | PREFILLED_SYRINGE | INTRAMUSCULAR | Status: DC

## 2023-02-13 NOTE — Progress Notes (Signed)
Physical Therapy Treatment Patient Details Name: Alexis Edwards MRN: [id number] DOB: 14-Aug-1946 Today's Date: 02/13/2023   History of Present Illness 77 yo female with onset of syncopal episode during bingo game was admitted 4/17, referred to rehab to manage mobility.  Pt reports light headed feelings, HA and did forget syncope had happened when she passed out. Pt has elevated troponin, declining memory, polycythemia, but no clear source for issues other than dehydration.  Orthostatic BP have been negative. Pt had some improvement on meclizine.   PMHx:  OSA, Takotsubo syndrome, COPD, chronic pain, obesity, HTN, HLD, CHF,    PT Comments    Pt making good improvement today.  She was able to ambulate 220' with RW and supervision.  Pt did fatigue and with shortness of breath by the end of the walk but denied dizziness or syncopal symptoms.  HR was 80's-90's during session with O2 sats 96% of >.  Pt with good progress and plan of care updated.  She reports she has assist available at home if needed.  Do recommend rollator next visit as that is what pt uses at home.    Recommendations for follow up therapy are one component of a multi-disciplinary discharge planning process, led by the attending physician.  Recommendations may be updated based on patient status, additional functional criteria and insurance authorization.  Follow Up Recommendations  Can patient physically be transported by private vehicle: Yes    Assistance Recommended at Discharge Intermittent Supervision/Assistance  Patient can return home with the following Assistance with cooking/housework;Assist for transportation;Help with stairs or ramp for entrance   Equipment Recommendations  None recommended by PT    Recommendations for Other Services       Precautions / Restrictions Precautions Precautions: Fall     Mobility  Bed Mobility Overal bed mobility: Needs Assistance Bed Mobility: Supine to Sit, Sit to Supine      Supine to sit: Supervision Sit to supine: Supervision        Transfers Overall transfer level: Needs assistance Equipment used: Rolling walker (2 wheels) Transfers: Sit to/from Stand Sit to Stand: Supervision                Ambulation/Gait Ambulation/Gait assistance: Supervision Gait Distance (Feet): 220 Feet Assistive device: Rolling walker (2 wheels) Gait Pattern/deviations: Step-through pattern, Trunk flexed Gait velocity: normal     General Gait Details: Pt starting well with upright posture but with fatigue leans forward with RW moving too far forward.  Cued for RW proximity.  Pt reports using rollator to allow for rest breaks at home but declined rest break at this time.  HR ws 80's-90's throughout and O2 sats 96% on RA.  Pt had no syncopal symptoms.   Stairs             Wheelchair Mobility    Modified Rankin (Stroke Patients Only)       Balance Overall balance assessment: Needs assistance Sitting-balance support: Feet supported Sitting balance-Leahy Scale: Normal Sitting balance - Comments: donned shoes at EOB   Standing balance support: No upper extremity supported, Bilateral upper extremity supported Standing balance-Leahy Scale: Fair Standing balance comment: RW to ambulate and was steady; could static stand without AD                            Cognition Arousal/Alertness: Awake/alert Behavior During Therapy: WFL for tasks assessed/performed Overall Cognitive Status: Within Functional Limits for tasks assessed  Exercises      General Comments General comments (skin integrity, edema, etc.): Pt had no dizziness or syncopal symptoms with fast head turns, looking L/R/Up/Up and L/and Up and R for 5 sec each.  Reports fatigue after walk and wants to return to bed      Pertinent Vitals/Pain Pain Assessment Pain Assessment: No/denies pain    Home Living                           Prior Function            PT Goals (current goals can now be found in the care plan section) Progress towards PT goals: Progressing toward goals    Frequency    Min 3X/week      PT Plan Discharge plan needs to be updated    Co-evaluation              AM-PAC PT "6 Clicks" Mobility   Outcome Measure  Help needed turning from your back to your side while in a flat bed without using bedrails?: A Little Help needed moving from lying on your back to sitting on the side of a flat bed without using bedrails?: A Little Help needed moving to and from a bed to a chair (including a wheelchair)?: A Little Help needed standing up from a chair using your arms (e.g., wheelchair or bedside chair)?: A Little Help needed to walk in hospital room?: A Little Help needed climbing 3-5 steps with a railing? : A Little 6 Click Score: 18    End of Session Equipment Utilized During Treatment: Gait belt Activity Tolerance: Patient tolerated treatment well Patient left: in bed;with call bell/phone within reach;with bed alarm set Nurse Communication: Mobility status PT Visit Diagnosis: Unsteadiness on feet (R26.81);Muscle weakness (generalized) (M62.81);Other abnormalities of gait and mobility (R26.89);Dizziness and giddiness (R42)     Time: 1007-1020 PT Time Calculation (min) (ACUTE ONLY): 13 min  Charges:  $Gait Training: 8-22 mins                     Anise Salvo, PT Acute Rehab [facility] [phone]    Rayetta Humphrey 02/13/2023, 10:29 AM

## 2023-02-13 NOTE — TOC Transition Note (Signed)
Transition of Care [facility]) - CM/SW Discharge Note   Patient Details  Name: Alexis Edwards MRN: [id number] Date of Birth: [birth year]  [facility] CM/SW Contact:  Janae Bridgeman, RN Phone Number: 02/13/2023, 1:01 PM   Clinical Narrative:    Cm met with the patient at the bedside and patient will be discharging home today with home health services.  I spoke with the patient and provided her with Medicare choice regarding home health and she did not have a preference.  I called Kandee Keen, CM with [facility] and updated that patient is discharging home today with daughter.  HH orders in EPIC and [place] placed in discharge instructions.  The patient will discharge home by car today with family after 3 pm today.   Final next level of care: Home w Home Health Services Barriers to Discharge: No Barriers Identified   Patient Goals and CMS Choice CMS Medicare.gov Compare Post Acute Care list provided to:: Patient Choice offered to / list presented to : Patient  Discharge Placement                         Discharge Plan and Services Additional resources added to the After Visit Summary for     Discharge Planning Services: CM Consult Post Acute Care Choice: Home Health          DME Arranged: Dan Humphreys DME Agency: [employer] Date DME Agency Contacted: 02/10/23 Time DME Agency Contacted: 1113   HH Arranged: PT, OT HH Agency: [facility] Date [facility] Agency Contacted: 02/13/23 Time HH Agency Contacted: 1301 Representative spoke with at [facility] Agency: Kandee Keen, CM with [place]  Social Determinants of Health (SDOH) Interventions SDOH Screenings   Food Insecurity: No Food Insecurity (02/09/2023)  Housing: Low Risk  (02/09/2023)  Transportation Needs: No Transportation Needs (02/09/2023)  Utilities: Not At Risk (02/09/2023)  Tobacco Use: Medium Risk (02/09/2023)     Readmission Risk Interventions     No data to display

## 2023-02-13 NOTE — Progress Notes (Signed)
Occupational Therapy Treatment Patient Details Name: Alexis Edwards MRN: [id number] DOB: 11/22/1945 Today's Date: 02/13/2023   History of present illness 77 yo female with onset of syncopal episode during bingo game was admitted 4/17, referred to rehab to manage mobility.  Pt reports light headed feelings, HA and did forget syncope had happened when she passed out. Pt has elevated troponin, declining memory, polycythemia, but no clear source for issues other than dehydration.  Orthostatic BP have been negative. Pt had some improvement on meclizine.   PMHx:  OSA, Takotsubo syndrome, COPD, chronic pain, obesity, HTN, HLD, CHF,   OT comments  Pt making good progress with functional goals. Noted that pt's daughter would like pt to go to SNF for short term rehab, however pt states that she would like to go home and that she is getting depressed not being at home. Pt able to get to EOB and stand at RW Sup, functional mobility to walk to bathroom with RW with Sup for toileting tasks with Sup and standing at sink for ADLs/grooming/hygiene tasks min guard A - Sup without LOB, c/o of dizziness or syncopal symptoms. HR remained 80-83 throughout and O2 sats 91-96% on RA. Pt would benefit from acute OT services to address impairments to maximize level of function and safety   Recommendations for follow up therapy are one component of a multi-disciplinary discharge planning process, led by the attending physician.  Recommendations may be updated based on patient status, additional functional criteria and insurance authorization.    Assistance Recommended at Discharge Intermittent Supervision/Assistance  Patient can return home with the following  A little help with bathing/dressing/bathroom;A little help with walking and/or transfers;Assistance with cooking/housework;Assist for transportation;Help with stairs or ramp for entrance   Equipment Recommendations  Other (comment) (sock aid)    Recommendations  for Other Services      Precautions / Restrictions Precautions Precautions: Fall Precaution Comments: monitor O2 sats and HR Restrictions Weight Bearing Restrictions: No       Mobility Bed Mobility Overal bed mobility: Needs Assistance Bed Mobility: Supine to Sit, Sit to Supine       Sit to supine: Supervision        Transfers Overall transfer level: Needs assistance   Transfers: Sit to/from Stand Sit to Stand: Supervision                 Balance Overall balance assessment: Needs assistance Sitting-balance support: Feet supported Sitting balance-Leahy Scale: Good Sitting balance - Comments: donned shoes at EOB   Standing balance support: No upper extremity supported, Bilateral upper extremity supported Standing balance-Leahy Scale: Fair                             ADL either performed or assessed with clinical judgement   ADL Overall ADL's : Needs assistance/impaired     Grooming: Wash/dry hands;Wash/dry face;Standing;Supervision/safety       Lower Body Bathing: Min English as a second language teacher Details (indicate cue type and reason): simulated, uses LH sponge at home     Lower Body Dressing: Min guard;Supervision/safety;Sit to/from stand Lower Body Dressing Details (indicate cue type and reason): simulated Toilet Transfer: Supervision/safety;Ambulation;Rolling walker (2 wheels);Regular Toilet;Grab bars   Toileting- Clothing Manipulation and Hygiene: Supervision/safety;Sit to/from stand       Functional mobility during ADLs: Supervision/safety;Rolling walker (2 wheels)      Extremity/Trunk Assessment Upper Extremity Assessment Upper Extremity Assessment: Generalized weakness   Lower Extremity Assessment Lower Extremity  Assessment: Defer to PT evaluation        Vision Ability to See in Adequate Light: 0 Adequate Patient Visual Report: No change from baseline     Perception     Praxis      Cognition  Arousal/Alertness: Awake/alert   Overall Cognitive Status: Within Functional Limits for tasks assessed                                          Exercises      Shoulder Instructions       General Comments Pt had no dizziness or syncopal symptoms with fast head turns, looking L/R/Up/Up and L/and Up and R for 5 sec each.  Reports fatigue after walk and wants to return to bed    Pertinent Vitals/ Pain       Pain Assessment Pain Assessment: No/denies pain  Home Living Family/patient expects to be discharged to:: Private residence Living Arrangements: Alone Available Help at Discharge: Friend(s);Available 24 hours/day                                    Prior Functioning/Environment              Frequency  Min 2X/week        Progress Toward Goals  OT Goals(current goals can now be found in the care plan section)  Progress towards OT goals: Progressing toward goals     Plan Discharge plan remains appropriate    Co-evaluation                 AM-PAC OT "6 Clicks" Daily Activity     Outcome Measure   Help from another person eating meals?: None Help from another person taking care of personal grooming?: A Little Help from another person toileting, which includes using toliet, bedpan, or urinal?: A Little Help from another person bathing (including washing, rinsing, drying)?: A Little Help from another person to put on and taking off regular upper body clothing?: None Help from another person to put on and taking off regular lower body clothing?: A Little 6 Click Score: 20    End of Session Equipment Utilized During Treatment: Rolling walker (2 wheels)  OT Visit Diagnosis: Unsteadiness on feet (R26.81)   Activity Tolerance     Patient Left in bed;with call bell/phone within reach   Nurse Communication          Time: 1610-9604 OT Time Calculation (min): 16 min  Charges: OT General Charges $OT Visit: 1 Visit OT  Treatments $Self Care/Home Management : 8-22 mins    Galen Manila 02/13/2023, 12:48 PM

## 2023-02-13 NOTE — TOC Progression Note (Signed)
Transition of Care [facility]) - Progression Note    Patient Details  Name: Alexis Edwards MRN: [id number] Date of Birth: 1946-10-15  Transition of [facility]) CM/SW Contact  Javione Gunawan A [place], [place] Phone Number: 02/13/2023, 1:03 PM  Clinical Narrative:     CSW met with pt and pt's family member at bedside to discuss SNF bed offers. Pt and family had not made a decision and wanted to follow up with CSW.   Pt discharge plan changed by provider from SNF to home health with PT.   TOC informed and talked with pt and pt's family regarding change in DC plan. TOC discussed with pt and ok with set up for home with home health services. HH has been set up for discharge.   Expected Discharge Plan: Home w Home Health Services Barriers to Discharge: No Barriers Identified  Expected Discharge Plan and Services   Discharge Planning Services: CM Consult Post Acute Care Choice: Home Health Living arrangements for the past 2 months: Single Family Home                 DME Arranged: Dan Humphreys DME Agency: [employer] Date DME Agency Contacted: 02/10/23 Time DME Agency Contacted: 1113   HH Arranged: PT, OT HH Agency: [facility] Date HH Agency Contacted: 02/13/23 Time HH Agency Contacted: 1301 Representative spoke with at [facility] Agency: Kandee Keen, CM with [place]   Social Determinants of Health (SDOH) Interventions SDOH Screenings   Food Insecurity: No Food Insecurity (02/09/2023)  Housing: Low Risk  (02/09/2023)  Transportation Needs: No Transportation Needs (02/09/2023)  Utilities: Not At Risk (02/09/2023)  Tobacco Use: Medium Risk (02/09/2023)    Readmission Risk Interventions     No data to display

## 2023-02-13 NOTE — Progress Notes (Signed)
PROGRESS NOTE    Alexis Edwards  ZOX:[id number] DOB: [birth year] DOA: 02/08/2023 PCP: Ailene Ravel, MD    Brief Narrative:   Alexis Edwards is a 77 y.o. female with medical history significant of hypertension, hyperlipidemia, CHF, Takotsubo syndrome, COPD, chronic pain morbid obesity, OSA not on CPAP who presents after having a syncopal episode while playing bingo.  History is obtained from the patient with assistance of her daughter who is present at bedside.  Patient still lives alone, but daughter makes note that over the last 4 months her short-term memory has significantly worsened.  Patient reports that she thinks she had eaten some nachos at some point while she was playing bingo yesterday.  She recalls getting a global headache and feeling lightheaded.  Patient reports that she did not lose consciousness, but EMS reported that bystanders stated that she lost consciousness briefly for which they called EMS.   Symptoms improved with meclizine.     Assessment and Plan: Syncope/dizziness Acute.  Patient presents after having syncopal episode while playing bingo.  Report feeling having a global headache and feeling lightheaded prior to passing out.  No seizure-like activity was reported.  CT angiogram of the head and neck did not note any large vessel occlusion or mass. -orthostatic vital signs negative - D-dimer not significantly elevated for age and TSH mildly elevated -echocardiogram: normal EF, grade 1 diastolic -PT to evaluate and treat- SNF -Will need to discuss safety precautions and [facility] law for which patient should not to drive for at least 6 months as cause of symptoms is not totally clear  -MRI negative for CVA - trial of meclizine resolved dizziness-- PRN   Elevated troponin Acute.  High-sensitivity troponin essentially flat at 35.  Patient denied any complaints of chest pain.  EKG without significant ischemic changes.  Possible pneumonia Doubt pna    Polycythemia Hemoglobin 16.5 on admission, but previously noted to be elevated as well.  Possibly secondary do to patient being dehydrated or untreated sleep apnea. -hgb trending down -would get outpatient sleep study   Renal insufficiency On admission creatinine elevated up to 1.25.  Baseline creatinine previously in 2020 was 0.88.  Possibly related to patient being on furosemide and not eating and drinking like normal due to issues with memory. -trending down   Hypothyroidism -resume home meds   Chronic pain Patient has pain in her back and legs which she is on oxycodone for pain. -Continue Cymbalta, gabapentin, and oxycodone as needed for pain   Memory loss Patient's for short-term memory, but daughter notes still currently living in her husband had alone after he passed away 2 years ago.  -outpatient follow up with PCP for depression   History of meningioma Patient with a prior history of meningioma which was resected.  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   -doubt seizure   OSA Patient reportedly not on CPAP at night   Obesity Estimated body mass index is 55.75 kg/m as calculated from the following:   Height as of this encounter:  (1.499 m).   Weight as of this encounter: 125.2 kg.   DVT prophylaxis:     Code Status: Full Code Family Communication: called daughter  Disposition Plan:  Level of care: Telemetry Medical Status is: inpt-- plan for SNF vs home health-- ? If PT recommendations have changed-- message sent    Consultants:  none   Subjective: No further dizziness  Objective: Vitals:  02/12/23 2058 02/13/23 0559 02/13/23 0600 02/13/23 0838  BP: (!) 121/56 (!) 145/80  (!) 140/82  Pulse: 67 65  60  Resp: 18 18  18   Temp: 98.8 F (37.1 C) 98.6 F (37 C)  97.8 F (36.6 C)  TempSrc: Oral Oral  Oral  SpO2: 94% 94%  93%  Weight:   125.2 kg   Height:        Intake/Output Summary (Last 24  hours) at 02/13/2023 1146 Last data filed at 02/13/2023 0559 Gross per 24 hour  Intake 480 ml  Output --  Net 480 ml    Filed Weights   02/11/23 0500 02/12/23 0500 02/13/23 0600  Weight: 126 kg 126.2 kg 125.2 kg    Examination:   General: Appearance:    Severely obese female in no acute distress   Obese abdomen  Lungs:     respirations unlabored  Heart:    Normal heart rate.     MS:   All extremities are intact.    Neurologic:   Awake, alert       Data Reviewed: I have personally reviewed following labs and imaging studies  CBC: Recent Labs  Lab 02/08/23 2240 02/10/23 0402 02/13/23 0350  WBC 9.8 8.0 9.1  NEUTROABS 6.5  --   --   HGB 16.5* 15.1* 14.7  HCT 49.2* 44.0 44.7  MCV 100.2* 99.3 100.2*  PLT 229 211 212   Basic Metabolic Panel: Recent Labs  Lab 02/08/23 2240 02/10/23 0402 02/11/23 0801 02/13/23 0350  NA 142 138 139 138  K 4.0 5.1 4.2 4.1  CL 103 105 104 102  CO2 27 27 23 26   GLUCOSE 174* 110* 95 112*  BUN 13 12 20 23   CREATININE 1.25* 1.37* 1.26* 1.15*  CALCIUM 9.3 8.4* 8.8* 9.0  MG 1.9  --   --   --    GFR: Estimated Creatinine Clearance: 49.2 mL/min (A) (by C-G formula based on SCr of 1.15 mg/dL (H)). Liver Function Tests: Recent Labs  Lab 02/08/23 2240  AST 18  ALT 11  ALKPHOS 78  BILITOT 0.9  PROT 6.7  ALBUMIN 3.6   No results for input(s): "LIPASE", "AMYLASE" in the last 168 hours. No results for input(s): "AMMONIA" in the last 168 hours. Coagulation Profile: No results for input(s): "INR", "PROTIME" in the last 168 hours. Cardiac Enzymes: No results for input(s): "CKTOTAL", "CKMB", "CKMBINDEX", "TROPONINI" in the last 168 hours. BNP (last 3 results) No results for input(s): "PROBNP" in the last 8760 hours. HbA1C: No results for input(s): "HGBA1C" in the last 72 hours. CBG: Recent Labs  Lab 02/09/23 1947  GLUCAP 160*   Lipid Profile: No results for input(s): "CHOL", "HDL", "LDLCALC", "TRIG", "CHOLHDL", "LDLDIRECT" in  the last 72 hours. Thyroid Function Tests: No results for input(s): "TSH", "T4TOTAL", "FREET4", "T3FREE", "THYROIDAB" in the last 72 hours.  Anemia Panel: No results for input(s): "VITAMINB12", "FOLATE", "FERRITIN", "TIBC", "IRON", "RETICCTPCT" in the last 72 hours. Sepsis Labs: Recent Labs  Lab 02/09/23 0115  PROCALCITON <0.10    No results found for this or any previous visit (from the past 240 hour(s)).       Radiology Studies: No results found.      Scheduled Meds:  amLODipine  5 mg Oral Daily   DULoxetine  60 mg Oral Daily   [START ON 02/14/2023] enoxaparin (LOVENOX) injection  60 mg Subcutaneous Q24H   famotidine  10 mg Oral BID   fesoterodine  4 mg Oral Daily   gabapentin  400 mg Oral TID   levothyroxine  50 mcg Oral QAC breakfast   rosuvastatin  20 mg Oral Daily   sodium chloride flush  3 mL Intravenous Q12H   Continuous Infusions:     LOS: 0 days    Time spent: 45 minutes spent on chart review, discussion with nursing staff, consultants, updating family and interview/physical exam; more than 50% of that time was spent in counseling and/or coordination of care.    Joseph Art, DO [facility] Available via Epic secure chat 7am-7pm After these hours, please refer to coverage provider listed on [email or web address] 02/13/2023, 11:46 AM

## 2023-02-13 NOTE — Discharge Summary (Addendum)
Physician Discharge Summary  Alexis Edwards ZOX:[id number] DOB: 20-Sep-1946 DOA: 02/08/2023  PCP: Alexis Ravel, MD  Admit date: 02/08/2023 Discharge date: 02/13/2023  Admitted From: home Discharge disposition: home   Recommendations for Outpatient Follow-Up:   Meclizine PRN for dizziness Home health PT/RN/social work Follow TSH Consider outpatient sleep study   Discharge Diagnosis:   Principal Problem:   Syncope Active Problems:   Elevated troponin   Pneumonia   Polycythemia   Renal insufficiency   Hypothyroidism   Chronic pain   Memory loss   History of meningioma of the brain dizziness   Discharge Condition: Improved.  Diet recommendation: Low sodium, heart healthy  Wound care: None.  Code status: Full.   History of Present Illness:   Alexis Edwards is a 77 y.o. female with medical history significant of hypertension, hyperlipidemia, CHF, Takotsubo syndrome, COPD, chronic pain morbid obesity, OSA not on CPAP who presents after having a syncopal episode while playing bingo.  History is obtained from the patient with assistance of her daughter who is present at bedside.  Patient still lives alone, but daughter makes note that over the last 4 months her short-term memory has significantly worsened.  Patient reports that she thinks she had eaten some nachos at some point while she was playing bingo yesterday.  She recalls getting a global headache and feeling lightheaded.  Patient reports that she did not lose consciousness, but EMS reported that bystanders stated that she lost consciousness briefly for which they called EMS. Denies ay recent fevers, chest pain, palpitations, nausea, vomiting, diarrhea, leg swelling, calf pain,or change in appetite.  Patient does report having intermittent cough.  Denies any significant history of smoking, tobacco use, or drug use.  She does admit to sitting for long periods of time at home.  Patient's daughter makes note  that the patient does not want to leave the house due to his connection with her husband who passed 2-1/2 years ago.  Patient lets me know that she is having issues with her memory several times throughout the visit.  Patient reports having history of hypothyroidism and being on levothyroxine but cannot recall the dose and daughter is unaware of this.  Patient receives her medications from the [facility] hospital.  She chronically has pain back and legs related to arthritis.    Hospital Course by Problem:   Possible Syncope (story not clear)/dizziness Acute.  Patient presents after having syncopal episode while playing bingo.  Report feeling having a global headache and feeling lightheaded prior to passing out.  No seizure-like activity was reported.  CT angiogram of the head and neck did not note any large vessel occlusion or mass. -orthostatic vital signs negative - D-dimer not significantly elevated for age and TSH mildly elevated -echocardiogram: normal EF, grade 1 diastolic -PT to evaluate and treat- changed recommendation to home health  -MRI negative for CVA - trial of meclizine resolved dizziness-- PRN    Elevated troponin Acute.  High-sensitivity troponin essentially flat at 35.  Patient denied any complaints of chest pain.  EKG without significant ischemic changes.   Possible pneumonia Doubt pna   Polycythemia Hemoglobin 16.5 on admission, but previously noted to be elevated as well.  Possibly secondary do to patient being dehydrated or untreated sleep apnea. -hgb trending down -would get outpatient sleep study   Renal insufficiency On admission creatinine elevated up to 1.25.  Baseline creatinine previously in 2020 was 0.88.  Possibly related to patient being on furosemide  and not eating and drinking like normal due to issues with memory. -trending down   Hypothyroidism -resume home meds   Chronic pain Patient has pain in her back and legs which she is on oxycodone for  pain. -Continue Cymbalta, gabapentin, and oxycodone as needed for pain   Memory loss Patient's for short-term memory, but daughter notes still currently living in her husband had alone after he passed away 2 years ago.  -outpatient follow up with PCP for depression   History of meningioma Patient with a prior history of meningioma which was resected.  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   -doubt seizure   OSA Patient reportedly not on CPAP at night   Obesity Estimated body mass index is 55.75 kg/m as calculated from the following:   Height as of this encounter: 4\' 11"  (1.499 m).   Weight as of this encounter: 125.2 kg.     Medical Consultants:      Discharge Exam:   Vitals:   02/13/23 0559 02/13/23 0838  BP: (!) 145/80 (!) 140/82  Pulse: 65 60  Resp: 18 18  Temp: 98.6 F (37 C) 97.8 F (36.6 C)  SpO2: 94% 93%   Vitals:   02/12/23 2058 02/13/23 0559 02/13/23 0600 02/13/23 0838  BP: (!) 121/56 (!) 145/80  (!) 140/82  Pulse: 67 65  60  Resp: 18 18  18   Temp: 98.8 F (37.1 C) 98.6 F (37 C)  97.8 F (36.6 C)  TempSrc: Oral Oral  Oral  SpO2: 94% 94%  93%  Weight:   125.2 kg   Height:        General exam: Appears calm and comfortable.    The results of significant diagnostics from this hospitalization (including imaging, microbiology, ancillary and laboratory) are listed below for reference.     Procedures and Diagnostic Studies:   CT Angio Head W or Wo Contrast  Result Date: 02/09/2023 CLINICAL DATA:  Syncope EXAM: CT ANGIOGRAPHY HEAD TECHNIQUE: Multidetector CT imaging of the head was performed using the standard protocol during bolus administration of intravenous contrast. Multiplanar CT image reconstructions and MIPs were obtained to evaluate the vascular anatomy. RADIATION DOSE REDUCTION: This exam was performed according to the departmental dose-optimization program which includes automated exposure  control, adjustment of the mA and/or kV according to patient size and/or use of iterative reconstruction technique. CONTRAST:  75mL OMNIPAQUE IOHEXOL 350 MG/ML SOLN COMPARISON:  07/07/2018 FINDINGS: CT HEAD Brain: There is no mass, hemorrhage or extra-axial collection. Unchanged left frontal encephalomalacia. There is periventricular hypoattenuation compatible with chronic microvascular disease. Vascular: No abnormal hyperdensity of the major intracranial arteries or dural venous sinuses. No intracranial atherosclerosis. Skull: Remote left frontal craniotomy. Sinuses/Orbits: No fluid levels or advanced mucosal thickening of the visualized paranasal sinuses. No mastoid or middle ear effusion. The orbits are normal. CTA HEAD POSTERIOR CIRCULATION: --Vertebral arteries: Normal --Inferior cerebellar arteries: Normal. --Basilar artery: Normal. --Superior cerebellar arteries: Normal. --Posterior cerebral arteries: Normal. The right PCA is predominantly supplied by the posterior communicating artery. ANTERIOR CIRCULATION: --Intracranial internal carotid arteries: Normal. --Anterior cerebral arteries (ACA): Normal. --Middle cerebral arteries (MCA): Normal. Review of the MIP images confirms the above findings. IMPRESSION: 1. No emergent large vessel occlusion or high-grade stenosis of the intracranial arteries. 2. No intracranial aneurysm. 3. Unchanged left frontal encephalomalacia. Electronically Signed   By: Deatra Robinson M.D.   On: 02/09/2023 02:31   DG Chest 2 View  Result Date: 02/09/2023 CLINICAL DATA:  Right lung crackles EXAM: CHEST - 2 VIEW COMPARISON:  07/09/2018 FINDINGS: Mild patchy right lower lobe opacity, overlying the spine on the lateral view, suspicious for pneumonia. Left lung is clear. No pleural effusion or pneumothorax. The heart is top-normal in size. IMPRESSION: Mild patchy right lower lobe opacity, suspicious for pneumonia. Electronically Signed   By: Charline Bills M.D.   On: 02/09/2023 00:27      Labs:   Basic Metabolic Panel: Recent Labs  Lab 02/08/23 2240 02/10/23 0402 02/11/23 0801 02/13/23 0350  NA 142 138 139 138  K 4.0 5.1 4.2 4.1  CL 103 105 104 102  CO2 27 27 23 26   GLUCOSE 174* 110* 95 112*  BUN 13 12 20 23   CREATININE 1.25* 1.37* 1.26* 1.15*  CALCIUM 9.3 8.4* 8.8* 9.0  MG 1.9  --   --   --    GFR Estimated Creatinine Clearance: 49.2 mL/min (A) (by C-G formula based on SCr of 1.15 mg/dL (H)). Liver Function Tests: Recent Labs  Lab 02/08/23 2240  AST 18  ALT 11  ALKPHOS 78  BILITOT 0.9  PROT 6.7  ALBUMIN 3.6   No results for input(s): "LIPASE", "AMYLASE" in the last 168 hours. No results for input(s): "AMMONIA" in the last 168 hours. Coagulation profile No results for input(s): "INR", "PROTIME" in the last 168 hours.  CBC: Recent Labs  Lab 02/08/23 2240 02/10/23 0402 02/13/23 0350  WBC 9.8 8.0 9.1  NEUTROABS 6.5  --   --   HGB 16.5* 15.1* 14.7  HCT 49.2* 44.0 44.7  MCV 100.2* 99.3 100.2*  PLT 229 211 212   Cardiac Enzymes: No results for input(s): "CKTOTAL", "CKMB", "CKMBINDEX", "TROPONINI" in the last 168 hours. BNP: Invalid input(s): "POCBNP" CBG: Recent Labs  Lab 02/09/23 1947  GLUCAP 160*   D-Dimer No results for input(s): "DDIMER" in the last 72 hours. Hgb A1c No results for input(s): "HGBA1C" in the last 72 hours. Lipid Profile No results for input(s): "CHOL", "HDL", "LDLCALC", "TRIG", "CHOLHDL", "LDLDIRECT" in the last 72 hours. Thyroid function studies No results for input(s): "TSH", "T4TOTAL", "T3FREE", "THYROIDAB" in the last 72 hours.  Invalid input(s): "FREET3" Anemia work up No results for input(s): "VITAMINB12", "FOLATE", "FERRITIN", "TIBC", "IRON", "RETICCTPCT" in the last 72 hours. Microbiology No results found for this or any previous visit (from the past 240 hour(s)).   Discharge Instructions:   Discharge Instructions     Diet - low sodium heart healthy   Complete by: As directed    Discharge  instructions   Complete by: As directed    Home health   Increase activity slowly   Complete by: As directed       Allergies as of 02/13/2023       Reactions   Macrobid [nitrofurantoin] Itching, Rash   Sulfa Antibiotics Itching, Rash        Medication List     STOP taking these medications    furosemide 20 MG tablet Commonly known as: LASIX       TAKE these medications    acetaminophen 500 MG tablet Commonly known as: TYLENOL Take 1,000 mg by mouth every 6 (six) hours as needed for headache (pain).   DULoxetine 60 MG capsule Commonly known as: CYMBALTA Take 60 mg by mouth daily.   famotidine 10 MG tablet Commonly known as: PEPCID Take 10 mg by mouth 2 (two) times daily.   gabapentin 400 MG capsule Commonly known as: NEURONTIN Take 400 mg by mouth 3 (three) times  daily. What changed: Another medication with the same name was removed. Continue taking this medication, and follow the directions you see here.   levothyroxine 50 MCG tablet Commonly known as: SYNTHROID Take 50 mcg by mouth daily before breakfast.   meclizine 12.5 MG tablet Commonly known as: ANTIVERT Take 1 tablet (12.5 mg total) by mouth 3 (three) times daily as needed for dizziness.   multivitamin with minerals Tabs tablet Take 1 tablet by mouth daily.   Oxycodone HCl 10 MG Tabs Take 10 mg by mouth every 6 (six) hours as needed (pain).   rosuvastatin 20 MG tablet Commonly known as: CRESTOR Take 20 mg by mouth daily.   solifenacin 10 MG tablet Commonly known as: VESICARE Take 10 mg by mouth at bedtime.               Durable Medical Equipment  (From admission, onward)           Start     Ordered   02/10/23 1112  For home use only DME Walker rolling  Once       Question Answer Comment  Walker: With 5 Inch Wheels   Patient needs a walker to treat with the following condition Weakness      02/10/23 1111            Follow-up Information     [facility] Follow up.   Specialty: Home Health Services Why: They will call you to set up services Contact information: [address] [place] [address] [phone]         [facility] Follow up.   Why: Your company for Dispensing optician information: [address] [place] [address] [phone]                  Time coordinating discharge: 45 min  Signed:  Joseph Art DO  [facility] 02/13/2023, 1:05 PM

## 2023-02-17 DIAGNOSIS — M79661 Pain in right lower leg: Secondary | ICD-10-CM | POA: Diagnosis not present

## 2023-02-17 DIAGNOSIS — I110 Hypertensive heart disease with heart failure: Secondary | ICD-10-CM | POA: Diagnosis not present

## 2023-02-17 DIAGNOSIS — J449 Chronic obstructive pulmonary disease, unspecified: Secondary | ICD-10-CM | POA: Diagnosis not present

## 2023-02-17 DIAGNOSIS — M549 Dorsalgia, unspecified: Secondary | ICD-10-CM | POA: Diagnosis not present

## 2023-02-17 DIAGNOSIS — N179 Acute kidney failure, unspecified: Secondary | ICD-10-CM | POA: Diagnosis not present

## 2023-02-17 DIAGNOSIS — I509 Heart failure, unspecified: Secondary | ICD-10-CM | POA: Diagnosis not present

## 2023-02-17 DIAGNOSIS — G8929 Other chronic pain: Secondary | ICD-10-CM | POA: Diagnosis not present

## 2023-02-17 DIAGNOSIS — D751 Secondary polycythemia: Secondary | ICD-10-CM | POA: Diagnosis not present

## 2023-02-17 DIAGNOSIS — M79662 Pain in left lower leg: Secondary | ICD-10-CM | POA: Diagnosis not present

## 2023-02-20 DIAGNOSIS — M549 Dorsalgia, unspecified: Secondary | ICD-10-CM | POA: Diagnosis not present

## 2023-02-20 DIAGNOSIS — J449 Chronic obstructive pulmonary disease, unspecified: Secondary | ICD-10-CM | POA: Diagnosis not present

## 2023-02-20 DIAGNOSIS — M79661 Pain in right lower leg: Secondary | ICD-10-CM | POA: Diagnosis not present

## 2023-02-20 DIAGNOSIS — D751 Secondary polycythemia: Secondary | ICD-10-CM | POA: Diagnosis not present

## 2023-02-20 DIAGNOSIS — I509 Heart failure, unspecified: Secondary | ICD-10-CM | POA: Diagnosis not present

## 2023-02-20 DIAGNOSIS — I110 Hypertensive heart disease with heart failure: Secondary | ICD-10-CM | POA: Diagnosis not present

## 2023-02-20 DIAGNOSIS — G8929 Other chronic pain: Secondary | ICD-10-CM | POA: Diagnosis not present

## 2023-02-20 DIAGNOSIS — N179 Acute kidney failure, unspecified: Secondary | ICD-10-CM | POA: Diagnosis not present

## 2023-02-20 DIAGNOSIS — M79662 Pain in left lower leg: Secondary | ICD-10-CM | POA: Diagnosis not present

## 2023-02-22 DIAGNOSIS — R55 Syncope and collapse: Secondary | ICD-10-CM | POA: Diagnosis not present

## 2023-02-22 DIAGNOSIS — Z139 Encounter for screening, unspecified: Secondary | ICD-10-CM | POA: Diagnosis not present

## 2023-02-22 DIAGNOSIS — F330 Major depressive disorder, recurrent, mild: Secondary | ICD-10-CM | POA: Diagnosis not present

## 2023-02-24 DIAGNOSIS — G8929 Other chronic pain: Secondary | ICD-10-CM | POA: Diagnosis not present

## 2023-02-24 DIAGNOSIS — I110 Hypertensive heart disease with heart failure: Secondary | ICD-10-CM | POA: Diagnosis not present

## 2023-02-24 DIAGNOSIS — M79662 Pain in left lower leg: Secondary | ICD-10-CM | POA: Diagnosis not present

## 2023-02-24 DIAGNOSIS — J449 Chronic obstructive pulmonary disease, unspecified: Secondary | ICD-10-CM | POA: Diagnosis not present

## 2023-02-24 DIAGNOSIS — N179 Acute kidney failure, unspecified: Secondary | ICD-10-CM | POA: Diagnosis not present

## 2023-02-24 DIAGNOSIS — M79661 Pain in right lower leg: Secondary | ICD-10-CM | POA: Diagnosis not present

## 2023-02-24 DIAGNOSIS — I509 Heart failure, unspecified: Secondary | ICD-10-CM | POA: Diagnosis not present

## 2023-02-24 DIAGNOSIS — D751 Secondary polycythemia: Secondary | ICD-10-CM | POA: Diagnosis not present

## 2023-02-24 DIAGNOSIS — M549 Dorsalgia, unspecified: Secondary | ICD-10-CM | POA: Diagnosis not present

## 2023-03-01 DIAGNOSIS — G8929 Other chronic pain: Secondary | ICD-10-CM | POA: Diagnosis not present

## 2023-03-01 DIAGNOSIS — J449 Chronic obstructive pulmonary disease, unspecified: Secondary | ICD-10-CM | POA: Diagnosis not present

## 2023-03-01 DIAGNOSIS — M79662 Pain in left lower leg: Secondary | ICD-10-CM | POA: Diagnosis not present

## 2023-03-01 DIAGNOSIS — I509 Heart failure, unspecified: Secondary | ICD-10-CM | POA: Diagnosis not present

## 2023-03-01 DIAGNOSIS — D751 Secondary polycythemia: Secondary | ICD-10-CM | POA: Diagnosis not present

## 2023-03-01 DIAGNOSIS — N179 Acute kidney failure, unspecified: Secondary | ICD-10-CM | POA: Diagnosis not present

## 2023-03-01 DIAGNOSIS — I110 Hypertensive heart disease with heart failure: Secondary | ICD-10-CM | POA: Diagnosis not present

## 2023-03-01 DIAGNOSIS — M549 Dorsalgia, unspecified: Secondary | ICD-10-CM | POA: Diagnosis not present

## 2023-03-01 DIAGNOSIS — M79661 Pain in right lower leg: Secondary | ICD-10-CM | POA: Diagnosis not present

## 2023-03-29 DIAGNOSIS — R413 Other amnesia: Secondary | ICD-10-CM | POA: Diagnosis not present

## 2023-07-18 DIAGNOSIS — Z79899 Other long term (current) drug therapy: Secondary | ICD-10-CM | POA: Diagnosis not present

## 2023-07-18 DIAGNOSIS — M47896 Other spondylosis, lumbar region: Secondary | ICD-10-CM | POA: Diagnosis not present

## 2023-07-18 DIAGNOSIS — S8002XA Contusion of left knee, initial encounter: Secondary | ICD-10-CM | POA: Diagnosis not present

## 2023-07-18 DIAGNOSIS — M25562 Pain in left knee: Secondary | ICD-10-CM | POA: Diagnosis not present

## 2023-07-18 DIAGNOSIS — G894 Chronic pain syndrome: Secondary | ICD-10-CM | POA: Diagnosis not present

## 2023-07-18 DIAGNOSIS — M1711 Unilateral primary osteoarthritis, right knee: Secondary | ICD-10-CM | POA: Diagnosis not present

## 2023-07-18 DIAGNOSIS — M5136 Other intervertebral disc degeneration, lumbar region: Secondary | ICD-10-CM | POA: Diagnosis not present

## 2023-07-18 DIAGNOSIS — Z5181 Encounter for therapeutic drug level monitoring: Secondary | ICD-10-CM | POA: Diagnosis not present

## 2023-07-18 DIAGNOSIS — M5416 Radiculopathy, lumbar region: Secondary | ICD-10-CM | POA: Diagnosis not present

## 2023-07-18 DIAGNOSIS — M5459 Other low back pain: Secondary | ICD-10-CM | POA: Diagnosis not present

## 2023-07-27 DIAGNOSIS — S8002XA Contusion of left knee, initial encounter: Secondary | ICD-10-CM | POA: Diagnosis not present

## 2023-07-27 DIAGNOSIS — M25562 Pain in left knee: Secondary | ICD-10-CM | POA: Diagnosis not present

## 2023-07-27 DIAGNOSIS — Z96652 Presence of left artificial knee joint: Secondary | ICD-10-CM | POA: Diagnosis not present

## 2023-09-03 NOTE — Progress Notes (Deleted)
Cardiology Clinic Note   Patient Name: Alexis Edwards Date of Encounter: 09/03/2023  Primary Care Provider:  Ailene Ravel, MD Primary Cardiologist:  Maisie Fus, MD  Patient Profile    Alexis Edwards 77 year old female presents to the clinic today for follow-up evaluation of her dyspnea.  Past Medical History    Past Medical History:  Diagnosis Date   Arthritis    CHF (congestive heart failure) (HCC)    Claustrophobia    COPD (chronic obstructive pulmonary disease) (HCC)    Depression    Dyslipidemia    Dyspnea    GERD (gastroesophageal reflux disease)    Heart murmur    History of kidney stones    HTN (hypertension)    Hyperlipidemia    Meningioma (HCC) 11/14/2018   Obesity    BMI 55   Pneumonia    hx   Sleep apnea    does not use cpap   Sleep apnea    Takotsubo syndrome April 2013   apical ballooning, [facility] [place]   Past Surgical History:  Procedure Laterality Date   ABDOMINAL HYSTERECTOMY     APPLICATION OF CRANIAL NAVIGATION N/A 12/12/2018   Procedure: APPLICATION OF CRANIAL NAVIGATION;  Surgeon: Jadene Pierini, MD;  Location: [facility] OR;  Service: Neurosurgery;  Laterality: N/A;   CARDIAC CATHETERIZATION     CHOLECYSTECTOMY  2/10   CRANIOTOMY Left 12/12/2018   Procedure: Left Craniotomy for tumor resection with brainlab;  Surgeon: Jadene Pierini, MD;  Location: [facility] OR;  Service: Neurosurgery;  Laterality: Left;   KIDNEY STONE SURGERY     REVERSE SHOULDER ARTHROPLASTY Left 02/24/2017   Procedure: REVERSE LEFT SHOULDER ARTHROPLASTY;  Surgeon: Beverely Low, MD;  Location: [facility] OR;  Service: Orthopedics;  Laterality: Left;   TOTAL KNEE ARTHROPLASTY  5/12   Lt    Allergies  Allergies  Allergen Reactions   Macrobid [Nitrofurantoin] Itching and Rash   Sulfa Antibiotics Itching and Rash    History of Present Illness    Alexis Edwards has a PMH of COPD, meningioma status postcraniotomy, former smoker, severe obesity, DOE, and  CKD stage IIIa.  Echocardiogram 6/23 showed an EF of 60-65%, normal RV function, mild LVH, and no pulmonary hypertension or significant valvular abnormalities.  She is a former patient of Dr. Tresa Endo.  She has a history of sleep apnea.  She was diagnosed with Takotsubo cardiomyopathy at [facility] in [place] previously.  She has normal coronary arteries.  She was seen by her PCP and was noted to have dyspnea on exertion with minimal activity.  EKG was normal.  She was getting lightheaded and dizzy.  Her shortness of breath had progressed for 6 months.  She did note some chest pressure at rest.  She denied this with activity.  She denied orthopnea and PND.  She had no lower extremity edema.  She was unable to tolerate CPAP.  She considered gastric surgery but declined.  She reported eating TV dinners.  She reported being very sedentary since her husband died in 09/15/2020.  Her blood pressures were well-controlled during her cardiology visit 9/23 with Dr. Wyline Mood.  Her TSH was noted to be mildly elevated.  She received furosemide and did note some improvement with her breathing.  She denied wheezing, PND, orthopnea and lower extremity swelling.  She was still limited in her physical activity due to her breathing.  She denied chest pressure.  She continued to refrain from smoking.  She was seen and  evaluated in the emergency department on 02/13/2023.  During that time she reported dizziness and possible syncope.  Etiology was unclear.  Head CT unremarkable.  Orthostatic vitals were negative.  D-dimer not significantly elevated.  TSH mildly elevated.  Echocardiogram showed normal EF and G1 DD.  PT was consulted to evaluate and treat.  Home health was recommended.  Cardiac MRI was negative for CVA.  She was given meclizine as needed.  She presents to the clinic today for follow-up evaluation and states***.  *** denies chest pain, shortness of breath, lower extremity edema, fatigue, palpitations, melena,  hematuria, hemoptysis, diaphoresis, weakness, presyncope, syncope, orthopnea, and PND.   Dyspnea-stable today.  Echocardiogram from 02/10/2023 showed LVEF of 70-75% and G1 DD.  COPD stable. Heart healthy low-sodium diet Continue furosemide Increase physical activity as tolerated Continue current medical therapy. Continue weight loss  Essential hypertension-BP today***. Maintain blood pressure log Heart healthy low-sodium diet  Hyperlipidemia-LDL***. High-fiber diet Continue rosuvastatin  COPD-stable. Continue current medical therapy Increase physical activity as tolerated  Elevated TSH-TSH*** Continue Synthroid Following with PCP  Obesity-weight today***. Increase physical activity as tolerated Reduce calorie diet  Disposition: Follow-up with Dr. Wyline Mood or me in 9-12 months.   Home Medications    Prior to Admission medications   Medication Sig Start Date End Date Taking? Authorizing Provider  acetaminophen (TYLENOL) 500 MG tablet Take 1,000 mg by mouth every 6 (six) hours as needed for headache (pain).    [provider]  DULoxetine (CYMBALTA) 60 MG capsule Take 60 mg by mouth daily.    [provider]  famotidine (PEPCID) 10 MG tablet Take 10 mg by mouth 2 (two) times daily.    [provider]  gabapentin (NEURONTIN) 400 MG capsule Take 400 mg by mouth 3 (three) times daily.    [provider]  levothyroxine (SYNTHROID) 50 MCG tablet Take 50 mcg by mouth daily before breakfast.    [provider]  meclizine (ANTIVERT) 12.5 MG tablet Take 1 tablet (12.5 mg total) by mouth 3 (three) times daily as needed for dizziness. 02/13/23   Joseph Art, DO  Multiple Vitamin (MULTIVITAMIN WITH MINERALS) TABS tablet Take 1 tablet by mouth daily. Patient not taking: Reported on 02/09/2023    [provider]  Oxycodone HCl 10 MG TABS Take 10 mg by mouth every 6 (six) hours as needed (pain).    [provider]  rosuvastatin  (CRESTOR) 20 MG tablet Take 20 mg by mouth daily.    [provider]  solifenacin (VESICARE) 10 MG tablet Take 10 mg by mouth at bedtime.     [provider]    Family History    Family History  Problem Relation Age of Onset   Coronary artery disease Brother 34       stent   Diabetes Mother    Hypertension Mother    Cancer Mother    Coronary artery disease Sister    Diabetes Sister    Cancer Sister    She indicated that her mother is deceased. She indicated that her father is deceased. She indicated that both of her sisters are alive. She indicated that her brother is alive.  Social History    Social History   Socioeconomic History   Marital status: Married    Spouse name: Not on file   Number of children: Not on file   Years of education: Not on file   Highest education level: Some college, no degree  Occupational History  Not on file  Tobacco Use   Smoking status: Former    Current packs/day: 0.00    Average packs/day: 1 pack/day for 20.0 years (20.0 ttl pk-yrs)    Types: Cigarettes    Start date: 10/22/1966    Quit date: 10/22/1986    Years since quitting: 36.8   Smokeless tobacco: Never  Vaping Use   Vaping status: Never Used  Substance and Sexual Activity   Alcohol use: Yes    Comment: rarely   Drug use: No   Sexual activity: Never  Other Topics Concern   Not on file  Social History Narrative   Right handed    2 cups daily of caffeine   Lives at home with husband     Social Determinants of Health   Financial Resource Strain: Not on file  Food Insecurity: No Food Insecurity (02/09/2023)   Hunger Vital Sign    Worried About Running Out of Food in the Last Year: Never true    Ran Out of Food in the Last Year: Never true  Transportation Needs: No Transportation Needs (02/09/2023)   PRAPARE - Administrator, Civil Service (Medical): No    Lack of Transportation (Non-Medical): No  Physical Activity: Not on file  Stress: Not  on file  Social Connections: Not on file  Intimate Partner Violence: Not At Risk (02/09/2023)   Humiliation, Afraid, Rape, and Kick questionnaire    Fear of Current or Ex-Partner: No    Emotionally Abused: No    Physically Abused: No    Sexually Abused: No     Review of Systems    General:  No chills, fever, night sweats or weight changes.  Cardiovascular:  No chest pain, dyspnea on exertion, edema, orthopnea, palpitations, paroxysmal nocturnal dyspnea. Dermatological: No rash, lesions/masses Respiratory: No cough, dyspnea Urologic: No hematuria, dysuria Abdominal:   No nausea, vomiting, diarrhea, bright red blood per rectum, melena, or hematemesis Neurologic:  No visual changes, wkns, changes in mental status. All other systems reviewed and are otherwise negative except as noted above.  Physical Exam    VS:  LMP  (LMP Unknown)  , BMI There is no height or weight on file to calculate BMI. GEN: Well nourished, well developed, in no acute distress. HEENT: normal. Neck: Supple, no JVD, carotid bruits, or masses. Cardiac: RRR, no murmurs, rubs, or gallops. No clubbing, cyanosis, edema.  Radials/DP/PT 2+ and equal bilaterally.  Respiratory:  Respirations regular and unlabored, clear to auscultation bilaterally. GI: Soft, nontender, nondistended, BS + x 4. MS: no deformity or atrophy. Skin: warm and dry, no rash. Neuro:  Strength and sensation are intact. Psych: Normal affect.  Accessory Clinical Findings    Recent Labs: 02/08/2023: ALT 11; B Natriuretic Peptide 28.5; Magnesium 1.9 02/09/2023: TSH 8.543 02/13/2023: BUN 23; Creatinine, Ser 1.15; Hemoglobin 14.7; Platelets 212; Potassium 4.1; Sodium 138   Recent Lipid Panel No results found for: "CHOL", "TRIG", "HDL", "CHOLHDL", "VLDL", "LDLCALC", "LDLDIRECT"  No BP recorded.  {Refresh Note OR Click here to enter BP  :1}***    ECG personally reviewed by me today- ***     Echocardiogram 02/10/2023  IMPRESSIONS     1. Left  ventricular ejection fraction, by estimation, is 70 to 75%. The  left ventricle has hyperdynamic function. The left ventricle has no  regional wall motion abnormalities. There is mild concentric left  ventricular hypertrophy. Left ventricular  diastolic parameters are consistent with Grade I diastolic dysfunction  (impaired relaxation).   2. Right  ventricular systolic function is normal. The right ventricular  size is normal.   3. Left atrial size was mildly dilated.   4. The mitral valve is normal in structure. No evidence of mitral valve  regurgitation. No evidence of mitral stenosis.   5. The aortic valve was not well visualized. There is moderate  calcification of the aortic valve. Aortic valve regurgitation is not  visualized. Mild aortic valve stenosis. Aortic valve mean gradient  measures 14.5 mmHg. Aortic valve Vmax measures 2.35  m/s.   6. The inferior vena cava is normal in size with greater than 50%  respiratory variability, suggesting right atrial pressure of 3 mmHg.   FINDINGS   Left Ventricle: Left ventricular ejection fraction, by estimation, is 70  to 75%. The left ventricle has hyperdynamic function. The left ventricle  has no regional wall motion abnormalities. The left ventricular internal  cavity size was normal in size.  There is mild concentric left ventricular hypertrophy. Left ventricular  diastolic parameters are consistent with Grade I diastolic dysfunction  (impaired relaxation).   Right Ventricle: The right ventricular size is normal. No increase in  right ventricular wall thickness. Right ventricular systolic function is  normal.   Left Atrium: Left atrial size was mildly dilated.   Right Atrium: Right atrial size was normal in size.   Pericardium: There is no evidence of pericardial effusion.   Mitral Valve: The mitral valve is normal in structure. No evidence of  mitral valve regurgitation. No evidence of mitral valve stenosis.   Tricuspid  Valve: The tricuspid valve is normal in structure. Tricuspid  valve regurgitation is trivial. No evidence of tricuspid stenosis.   Aortic Valve: The aortic valve was not well visualized. There is moderate  calcification of the aortic valve. Aortic valve regurgitation is not  visualized. Mild aortic stenosis is present. Aortic valve mean gradient  measures 14.5 mmHg. Aortic valve peak  gradient measures 22.1 mmHg.   Pulmonic Valve: The pulmonic valve was normal in structure. Pulmonic valve  regurgitation is not visualized. No evidence of pulmonic stenosis.   Aorta: The aortic root is normal in size and structure.   Venous: The inferior vena cava is normal in size with greater than 50%  respiratory variability, suggesting right atrial pressure of 3 mmHg.   IAS/Shunts: No atrial level shunt detected by color flow Doppler.   Nuclear stress test 02/09/2011 Low risk and no ischemia.       Assessment & Plan   1.  ***   Thomasene Ripple. Marcelus Dubberly NP-C     09/03/2023, 2:22 PM [facility] [address] Office [phone] Fax [phone]    I spent***minutes examining this patient, reviewing medications, and using patient centered shared decision making involving her cardiac care.   I spent greater than 20 minutes reviewing her past medical history,  medications, and prior cardiac tests.

## 2023-09-04 ENCOUNTER — Ambulatory Visit: Payer: PPO | Attending: General Practice | Admitting: General Practice

## 2023-09-04 ENCOUNTER — Ambulatory Visit: Payer: PPO | Admitting: Physician Assistant

## 2023-10-09 DIAGNOSIS — M25562 Pain in left knee: Secondary | ICD-10-CM | POA: Diagnosis not present

## 2023-10-09 DIAGNOSIS — M47896 Other spondylosis, lumbar region: Secondary | ICD-10-CM | POA: Diagnosis not present

## 2023-10-09 DIAGNOSIS — G894 Chronic pain syndrome: Secondary | ICD-10-CM | POA: Diagnosis not present

## 2023-10-09 DIAGNOSIS — Z139 Encounter for screening, unspecified: Secondary | ICD-10-CM | POA: Diagnosis not present

## 2023-10-09 DIAGNOSIS — I5020 Unspecified systolic (congestive) heart failure: Secondary | ICD-10-CM | POA: Diagnosis not present

## 2023-10-09 DIAGNOSIS — M51362 Other intervertebral disc degeneration, lumbar region with discogenic back pain and lower extremity pain: Secondary | ICD-10-CM | POA: Diagnosis not present

## 2023-10-09 DIAGNOSIS — Z1331 Encounter for screening for depression: Secondary | ICD-10-CM | POA: Diagnosis not present

## 2023-10-09 DIAGNOSIS — M5459 Other low back pain: Secondary | ICD-10-CM | POA: Diagnosis not present

## 2023-10-09 DIAGNOSIS — J439 Emphysema, unspecified: Secondary | ICD-10-CM | POA: Diagnosis not present

## 2023-10-09 DIAGNOSIS — N1831 Chronic kidney disease, stage 3a: Secondary | ICD-10-CM | POA: Diagnosis not present

## 2023-10-09 DIAGNOSIS — I700 Atherosclerosis of aorta: Secondary | ICD-10-CM | POA: Diagnosis not present

## 2023-10-09 DIAGNOSIS — E039 Hypothyroidism, unspecified: Secondary | ICD-10-CM | POA: Diagnosis not present

## 2023-10-09 DIAGNOSIS — Z9181 History of falling: Secondary | ICD-10-CM | POA: Diagnosis not present

## 2023-10-09 DIAGNOSIS — E785 Hyperlipidemia, unspecified: Secondary | ICD-10-CM | POA: Diagnosis not present

## 2023-10-09 DIAGNOSIS — M1711 Unilateral primary osteoarthritis, right knee: Secondary | ICD-10-CM | POA: Diagnosis not present

## 2023-10-09 DIAGNOSIS — M5416 Radiculopathy, lumbar region: Secondary | ICD-10-CM | POA: Diagnosis not present

## 2023-10-10 ENCOUNTER — Other Ambulatory Visit: Payer: Self-pay | Admitting: Family Medicine

## 2023-10-10 DIAGNOSIS — E2839 Other primary ovarian failure: Secondary | ICD-10-CM

## 2024-04-08 DIAGNOSIS — M549 Dorsalgia, unspecified: Secondary | ICD-10-CM | POA: Diagnosis not present

## 2024-05-13 DIAGNOSIS — E785 Hyperlipidemia, unspecified: Secondary | ICD-10-CM | POA: Diagnosis not present

## 2024-05-13 DIAGNOSIS — R413 Other amnesia: Secondary | ICD-10-CM | POA: Diagnosis not present

## 2024-05-13 DIAGNOSIS — J439 Emphysema, unspecified: Secondary | ICD-10-CM | POA: Diagnosis not present

## 2024-05-13 DIAGNOSIS — I5020 Unspecified systolic (congestive) heart failure: Secondary | ICD-10-CM | POA: Diagnosis not present

## 2024-05-13 DIAGNOSIS — N1831 Chronic kidney disease, stage 3a: Secondary | ICD-10-CM | POA: Diagnosis not present

## 2024-05-13 DIAGNOSIS — E039 Hypothyroidism, unspecified: Secondary | ICD-10-CM | POA: Diagnosis not present

## 2024-05-13 DIAGNOSIS — Z23 Encounter for immunization: Secondary | ICD-10-CM | POA: Diagnosis not present

## 2024-05-13 DIAGNOSIS — M48061 Spinal stenosis, lumbar region without neurogenic claudication: Secondary | ICD-10-CM | POA: Diagnosis not present

## 2024-06-20 DIAGNOSIS — R109 Unspecified abdominal pain: Secondary | ICD-10-CM | POA: Diagnosis not present

## 2024-06-20 DIAGNOSIS — N390 Urinary tract infection, site not specified: Secondary | ICD-10-CM | POA: Diagnosis not present

## 2024-07-30 DIAGNOSIS — I951 Orthostatic hypotension: Secondary | ICD-10-CM | POA: Diagnosis not present

## 2024-07-30 DIAGNOSIS — R000 Tachycardia, unspecified: Secondary | ICD-10-CM | POA: Diagnosis not present

## 2024-07-30 DIAGNOSIS — E039 Hypothyroidism, unspecified: Secondary | ICD-10-CM | POA: Diagnosis not present

## 2024-07-30 DIAGNOSIS — E875 Hyperkalemia: Secondary | ICD-10-CM | POA: Diagnosis not present

## 2024-07-30 DIAGNOSIS — R42 Dizziness and giddiness: Secondary | ICD-10-CM | POA: Diagnosis not present

## 2024-08-29 ENCOUNTER — Ambulatory Visit: Attending: Internal Medicine | Admitting: Internal Medicine

## 2024-08-30 ENCOUNTER — Encounter: Payer: Self-pay | Admitting: Internal Medicine

## 2024-09-04 DIAGNOSIS — Z5181 Encounter for therapeutic drug level monitoring: Secondary | ICD-10-CM | POA: Diagnosis not present

## 2024-09-04 DIAGNOSIS — Z79899 Other long term (current) drug therapy: Secondary | ICD-10-CM | POA: Diagnosis not present

## 2024-09-04 DIAGNOSIS — M5416 Radiculopathy, lumbar region: Secondary | ICD-10-CM | POA: Diagnosis not present

## 2024-09-04 DIAGNOSIS — G894 Chronic pain syndrome: Secondary | ICD-10-CM | POA: Diagnosis not present
